# Patient Record
Sex: Female | Born: 1940 | Race: White | Hispanic: No | Smoking: Never smoker
Health system: Southern US, Community
[De-identification: ages and names within clinical notes are randomized; demographics above are authoritative.]

## PROBLEM LIST (undated history)

## (undated) DIAGNOSIS — K589 Irritable bowel syndrome without diarrhea: Secondary | ICD-10-CM

## (undated) DIAGNOSIS — H3530 Unspecified macular degeneration: Secondary | ICD-10-CM

## (undated) DIAGNOSIS — Z8042 Family history of malignant neoplasm of prostate: Secondary | ICD-10-CM

## (undated) DIAGNOSIS — F32A Depression, unspecified: Secondary | ICD-10-CM

## (undated) DIAGNOSIS — F329 Major depressive disorder, single episode, unspecified: Secondary | ICD-10-CM

## (undated) DIAGNOSIS — A6000 Herpesviral infection of urogenital system, unspecified: Secondary | ICD-10-CM

## (undated) DIAGNOSIS — Z803 Family history of malignant neoplasm of breast: Secondary | ICD-10-CM

## (undated) DIAGNOSIS — F3289 Other specified depressive episodes: Secondary | ICD-10-CM

## (undated) DIAGNOSIS — K644 Residual hemorrhoidal skin tags: Secondary | ICD-10-CM

## (undated) DIAGNOSIS — M949 Disorder of cartilage, unspecified: Secondary | ICD-10-CM

## (undated) DIAGNOSIS — M4800 Spinal stenosis, site unspecified: Secondary | ICD-10-CM

## (undated) DIAGNOSIS — D649 Anemia, unspecified: Secondary | ICD-10-CM

## (undated) DIAGNOSIS — F319 Bipolar disorder, unspecified: Secondary | ICD-10-CM

## (undated) DIAGNOSIS — M899 Disorder of bone, unspecified: Secondary | ICD-10-CM

## (undated) DIAGNOSIS — M545 Low back pain, unspecified: Secondary | ICD-10-CM

## (undated) DIAGNOSIS — J40 Bronchitis, not specified as acute or chronic: Secondary | ICD-10-CM

## (undated) DIAGNOSIS — C50919 Malignant neoplasm of unspecified site of unspecified female breast: Secondary | ICD-10-CM

## (undated) DIAGNOSIS — Z923 Personal history of irradiation: Secondary | ICD-10-CM

## (undated) DIAGNOSIS — Z800 Family history of malignant neoplasm of digestive organs: Secondary | ICD-10-CM

## (undated) HISTORY — DX: Bipolar disorder, unspecified: F31.9

## (undated) HISTORY — DX: Spinal stenosis, site unspecified: M48.00

## (undated) HISTORY — DX: Family history of malignant neoplasm of prostate: Z80.42

## (undated) HISTORY — DX: Irritable bowel syndrome without diarrhea: K58.9

## (undated) HISTORY — DX: Depression, unspecified: F32.A

## (undated) HISTORY — DX: Disorder of cartilage, unspecified: M94.9

## (undated) HISTORY — PX: DILATION AND CURETTAGE OF UTERUS: SHX78

## (undated) HISTORY — DX: Herpesviral infection of urogenital system, unspecified: A60.00

## (undated) HISTORY — DX: Unspecified macular degeneration: H35.30

## (undated) HISTORY — DX: Bronchitis, not specified as acute or chronic: J40

## (undated) HISTORY — DX: Other specified depressive episodes: F32.89

## (undated) HISTORY — PX: RECTOCELE REPAIR: SHX761

## (undated) HISTORY — DX: Residual hemorrhoidal skin tags: K64.4

## (undated) HISTORY — PX: TONSILLECTOMY: SUR1361

## (undated) HISTORY — DX: Anemia, unspecified: D64.9

## (undated) HISTORY — PX: ABDOMINAL HYSTERECTOMY: SHX81

## (undated) HISTORY — DX: Malignant neoplasm of unspecified site of unspecified female breast: C50.919

## (undated) HISTORY — DX: Major depressive disorder, single episode, unspecified: F32.9

## (undated) HISTORY — DX: Family history of malignant neoplasm of breast: Z80.3

## (undated) HISTORY — DX: Family history of malignant neoplasm of digestive organs: Z80.0

## (undated) HISTORY — PX: OTHER SURGICAL HISTORY: SHX169

## (undated) HISTORY — DX: Low back pain, unspecified: M54.50

## (undated) HISTORY — DX: Disorder of bone, unspecified: M89.9

## (undated) HISTORY — DX: Low back pain: M54.5

---

## 1998-02-13 ENCOUNTER — Ambulatory Visit (HOSPITAL_COMMUNITY): Admission: RE | Admit: 1998-02-13 | Discharge: 1998-02-13 | Payer: Self-pay | Admitting: Obstetrics & Gynecology

## 1999-02-05 ENCOUNTER — Other Ambulatory Visit: Admission: RE | Admit: 1999-02-05 | Discharge: 1999-02-05 | Payer: Self-pay | Admitting: *Deleted

## 2000-02-19 ENCOUNTER — Encounter: Admission: RE | Admit: 2000-02-19 | Discharge: 2000-02-19 | Payer: Self-pay | Admitting: Neurosurgery

## 2000-02-19 ENCOUNTER — Encounter: Payer: Self-pay | Admitting: Neurosurgery

## 2000-03-17 ENCOUNTER — Other Ambulatory Visit: Admission: RE | Admit: 2000-03-17 | Discharge: 2000-03-17 | Payer: Self-pay | Admitting: *Deleted

## 2000-04-02 ENCOUNTER — Encounter: Payer: Self-pay | Admitting: *Deleted

## 2000-04-02 ENCOUNTER — Encounter: Admission: RE | Admit: 2000-04-02 | Discharge: 2000-04-02 | Payer: Self-pay | Admitting: *Deleted

## 2001-03-23 ENCOUNTER — Other Ambulatory Visit: Admission: RE | Admit: 2001-03-23 | Discharge: 2001-03-23 | Payer: Self-pay | Admitting: *Deleted

## 2002-03-08 ENCOUNTER — Ambulatory Visit (HOSPITAL_COMMUNITY): Admission: RE | Admit: 2002-03-08 | Discharge: 2002-03-08 | Payer: Self-pay | Admitting: Hematology & Oncology

## 2002-03-08 ENCOUNTER — Encounter: Payer: Self-pay | Admitting: Hematology & Oncology

## 2002-03-29 ENCOUNTER — Other Ambulatory Visit: Admission: RE | Admit: 2002-03-29 | Discharge: 2002-03-29 | Payer: Self-pay | Admitting: *Deleted

## 2002-04-18 ENCOUNTER — Encounter: Admission: RE | Admit: 2002-04-18 | Discharge: 2002-04-18 | Payer: Self-pay | Admitting: *Deleted

## 2002-04-18 ENCOUNTER — Encounter: Payer: Self-pay | Admitting: *Deleted

## 2003-03-30 ENCOUNTER — Other Ambulatory Visit: Admission: RE | Admit: 2003-03-30 | Discharge: 2003-03-30 | Payer: Self-pay | Admitting: *Deleted

## 2004-04-16 ENCOUNTER — Other Ambulatory Visit: Admission: RE | Admit: 2004-04-16 | Discharge: 2004-04-16 | Payer: Self-pay | Admitting: Internal Medicine

## 2004-04-25 ENCOUNTER — Encounter: Admission: RE | Admit: 2004-04-25 | Discharge: 2004-04-25 | Payer: Self-pay | Admitting: Internal Medicine

## 2005-10-31 ENCOUNTER — Other Ambulatory Visit: Admission: RE | Admit: 2005-10-31 | Discharge: 2005-10-31 | Payer: Self-pay | Admitting: Internal Medicine

## 2005-11-04 ENCOUNTER — Encounter: Admission: RE | Admit: 2005-11-04 | Discharge: 2005-11-04 | Payer: Self-pay | Admitting: Internal Medicine

## 2006-11-03 ENCOUNTER — Encounter: Payer: Self-pay | Admitting: Family Medicine

## 2007-03-15 ENCOUNTER — Other Ambulatory Visit: Admission: RE | Admit: 2007-03-15 | Discharge: 2007-03-15 | Payer: Self-pay | Admitting: Internal Medicine

## 2007-03-30 LAB — CONVERTED CEMR LAB: Pap Smear: NORMAL

## 2007-04-26 ENCOUNTER — Encounter: Admission: RE | Admit: 2007-04-26 | Discharge: 2007-04-26 | Payer: Self-pay | Admitting: Internal Medicine

## 2007-05-28 ENCOUNTER — Ambulatory Visit: Payer: Self-pay | Admitting: Internal Medicine

## 2007-05-30 HISTORY — PX: OTHER SURGICAL HISTORY: SHX169

## 2007-06-17 ENCOUNTER — Encounter: Admission: RE | Admit: 2007-06-17 | Discharge: 2007-06-17 | Payer: Self-pay | Admitting: Internal Medicine

## 2007-07-01 ENCOUNTER — Encounter: Admission: RE | Admit: 2007-07-01 | Discharge: 2007-07-01 | Payer: Self-pay | Admitting: Internal Medicine

## 2007-07-06 ENCOUNTER — Encounter: Admission: RE | Admit: 2007-07-06 | Discharge: 2007-07-06 | Payer: Self-pay | Admitting: Internal Medicine

## 2007-07-23 ENCOUNTER — Encounter: Admission: RE | Admit: 2007-07-23 | Discharge: 2007-08-26 | Payer: Self-pay | Admitting: Internal Medicine

## 2007-07-30 ENCOUNTER — Ambulatory Visit: Payer: Self-pay | Admitting: Internal Medicine

## 2007-08-13 ENCOUNTER — Ambulatory Visit: Payer: Self-pay | Admitting: Internal Medicine

## 2007-08-13 LAB — HM COLONOSCOPY: HM Colonoscopy: NORMAL

## 2007-10-08 ENCOUNTER — Ambulatory Visit: Payer: Self-pay | Admitting: Family Medicine

## 2007-10-08 DIAGNOSIS — F339 Major depressive disorder, recurrent, unspecified: Secondary | ICD-10-CM | POA: Insufficient documentation

## 2007-10-08 DIAGNOSIS — M4800 Spinal stenosis, site unspecified: Secondary | ICD-10-CM

## 2007-10-11 ENCOUNTER — Encounter: Payer: Self-pay | Admitting: Family Medicine

## 2007-10-12 ENCOUNTER — Encounter: Payer: Self-pay | Admitting: Family Medicine

## 2007-11-16 ENCOUNTER — Ambulatory Visit: Payer: Self-pay | Admitting: Family Medicine

## 2007-11-16 DIAGNOSIS — A6000 Herpesviral infection of urogenital system, unspecified: Secondary | ICD-10-CM | POA: Insufficient documentation

## 2007-12-09 ENCOUNTER — Telehealth: Payer: Self-pay | Admitting: Family Medicine

## 2007-12-20 ENCOUNTER — Encounter: Admission: RE | Admit: 2007-12-20 | Discharge: 2007-12-20 | Payer: Self-pay | Admitting: Family Medicine

## 2007-12-28 ENCOUNTER — Telehealth: Payer: Self-pay | Admitting: Family Medicine

## 2007-12-30 DIAGNOSIS — Z923 Personal history of irradiation: Secondary | ICD-10-CM

## 2007-12-30 HISTORY — DX: Personal history of irradiation: Z92.3

## 2008-01-03 ENCOUNTER — Telehealth: Payer: Self-pay | Admitting: Family Medicine

## 2008-01-05 ENCOUNTER — Encounter: Payer: Self-pay | Admitting: Family Medicine

## 2008-03-03 ENCOUNTER — Ambulatory Visit (HOSPITAL_COMMUNITY): Admission: RE | Admit: 2008-03-03 | Discharge: 2008-03-03 | Payer: Self-pay | Admitting: Neurosurgery

## 2008-03-10 ENCOUNTER — Telehealth: Payer: Self-pay | Admitting: Family Medicine

## 2008-04-12 ENCOUNTER — Ambulatory Visit: Payer: Self-pay | Admitting: Family Medicine

## 2008-04-12 DIAGNOSIS — M545 Low back pain, unspecified: Secondary | ICD-10-CM | POA: Insufficient documentation

## 2008-04-18 ENCOUNTER — Ambulatory Visit: Payer: Self-pay | Admitting: Professional

## 2008-04-25 ENCOUNTER — Ambulatory Visit: Payer: Self-pay | Admitting: Professional

## 2008-04-27 ENCOUNTER — Ambulatory Visit: Payer: Self-pay | Admitting: Family Medicine

## 2008-05-04 ENCOUNTER — Telehealth: Payer: Self-pay | Admitting: Family Medicine

## 2008-05-10 ENCOUNTER — Encounter
Admission: RE | Admit: 2008-05-10 | Discharge: 2008-08-08 | Payer: Self-pay | Admitting: Physical Medicine and Rehabilitation

## 2008-05-15 ENCOUNTER — Ambulatory Visit: Payer: Self-pay | Admitting: Physical Medicine and Rehabilitation

## 2008-05-26 ENCOUNTER — Encounter
Admission: RE | Admit: 2008-05-26 | Discharge: 2008-06-13 | Payer: Self-pay | Admitting: Physical Medicine and Rehabilitation

## 2008-05-29 HISTORY — PX: BREAST LUMPECTOMY: SHX2

## 2008-05-31 ENCOUNTER — Ambulatory Visit: Payer: Self-pay | Admitting: Family Medicine

## 2008-06-12 ENCOUNTER — Telehealth: Payer: Self-pay | Admitting: Family Medicine

## 2008-06-19 ENCOUNTER — Encounter: Admission: RE | Admit: 2008-06-19 | Discharge: 2008-06-19 | Payer: Self-pay | Admitting: Family Medicine

## 2008-06-22 ENCOUNTER — Encounter: Admission: RE | Admit: 2008-06-22 | Discharge: 2008-06-22 | Payer: Self-pay | Admitting: Family Medicine

## 2008-07-03 ENCOUNTER — Ambulatory Visit: Payer: Self-pay | Admitting: Physical Medicine & Rehabilitation

## 2008-07-05 ENCOUNTER — Other Ambulatory Visit (INDEPENDENT_AMBULATORY_CARE_PROVIDER_SITE_OTHER): Payer: Self-pay | Admitting: Surgery

## 2008-07-05 ENCOUNTER — Encounter: Admission: RE | Admit: 2008-07-05 | Discharge: 2008-07-05 | Payer: Self-pay | Admitting: Surgery

## 2008-07-18 ENCOUNTER — Encounter: Admission: RE | Admit: 2008-07-18 | Discharge: 2008-07-18 | Payer: Self-pay | Admitting: Surgery

## 2008-07-18 ENCOUNTER — Encounter: Payer: Self-pay | Admitting: Family Medicine

## 2008-07-18 ENCOUNTER — Encounter (INDEPENDENT_AMBULATORY_CARE_PROVIDER_SITE_OTHER): Payer: Self-pay | Admitting: Surgery

## 2008-07-18 ENCOUNTER — Ambulatory Visit (HOSPITAL_BASED_OUTPATIENT_CLINIC_OR_DEPARTMENT_OTHER): Admission: RE | Admit: 2008-07-18 | Discharge: 2008-07-18 | Payer: Self-pay | Admitting: Surgery

## 2008-07-25 ENCOUNTER — Ambulatory Visit: Payer: Self-pay | Admitting: Oncology

## 2008-07-25 DIAGNOSIS — C50919 Malignant neoplasm of unspecified site of unspecified female breast: Secondary | ICD-10-CM

## 2008-07-31 ENCOUNTER — Encounter: Payer: Self-pay | Admitting: Internal Medicine

## 2008-07-31 ENCOUNTER — Encounter: Payer: Self-pay | Admitting: Family Medicine

## 2008-07-31 LAB — CBC WITH DIFFERENTIAL (CANCER CENTER ONLY)
BASO%: 0.5 % (ref 0.0–2.0)
EOS%: 9.9 % — ABNORMAL HIGH (ref 0.0–7.0)
LYMPH%: 41.2 % (ref 14.0–48.0)
MCH: 28.8 pg (ref 26.0–34.0)
MCHC: 34.9 g/dL (ref 32.0–36.0)
MCV: 82 fL (ref 81–101)
MONO%: 5.7 % (ref 0.0–13.0)
NEUT#: 1.8 10*3/uL (ref 1.5–6.5)
Platelets: 238 10*3/uL (ref 145–400)
RDW: 12.8 % (ref 10.5–14.6)

## 2008-07-31 LAB — COMPREHENSIVE METABOLIC PANEL
ALT: 18 U/L (ref 0–35)
BUN: 10 mg/dL (ref 6–23)
CO2: 28 mEq/L (ref 19–32)
Calcium: 9.4 mg/dL (ref 8.4–10.5)
Creatinine, Ser: 0.63 mg/dL (ref 0.40–1.20)
Total Bilirubin: 0.6 mg/dL (ref 0.3–1.2)

## 2008-08-01 LAB — IRON AND TIBC
%SAT: 23 % (ref 20–55)
Iron: 82 ug/dL (ref 42–145)
UIBC: 268 ug/dL

## 2008-08-01 LAB — FERRITIN: Ferritin: 75 ng/mL (ref 10–291)

## 2008-08-02 ENCOUNTER — Ambulatory Visit: Admission: RE | Admit: 2008-08-02 | Discharge: 2008-10-16 | Payer: Self-pay | Admitting: Radiation Oncology

## 2008-08-02 ENCOUNTER — Encounter: Payer: Self-pay | Admitting: Family Medicine

## 2008-08-08 ENCOUNTER — Encounter
Admission: RE | Admit: 2008-08-08 | Discharge: 2008-08-09 | Payer: Self-pay | Admitting: Physical Medicine and Rehabilitation

## 2008-08-09 ENCOUNTER — Ambulatory Visit: Payer: Self-pay | Admitting: Physical Medicine and Rehabilitation

## 2008-09-11 ENCOUNTER — Telehealth: Payer: Self-pay | Admitting: Family Medicine

## 2008-09-29 ENCOUNTER — Other Ambulatory Visit: Admission: RE | Admit: 2008-09-29 | Discharge: 2008-09-29 | Payer: Self-pay | Admitting: Family Medicine

## 2008-09-29 ENCOUNTER — Encounter: Payer: Self-pay | Admitting: Family Medicine

## 2008-09-29 ENCOUNTER — Ambulatory Visit: Payer: Self-pay | Admitting: Family Medicine

## 2008-10-02 ENCOUNTER — Telehealth: Payer: Self-pay | Admitting: Family Medicine

## 2008-10-02 LAB — CONVERTED CEMR LAB: Pap Smear: NORMAL

## 2008-10-04 ENCOUNTER — Encounter (INDEPENDENT_AMBULATORY_CARE_PROVIDER_SITE_OTHER): Payer: Self-pay | Admitting: *Deleted

## 2008-10-10 ENCOUNTER — Encounter: Payer: Self-pay | Admitting: Family Medicine

## 2008-10-23 ENCOUNTER — Ambulatory Visit: Payer: Self-pay | Admitting: Oncology

## 2008-10-24 ENCOUNTER — Encounter: Payer: Self-pay | Admitting: Family Medicine

## 2008-10-24 LAB — MORPHOLOGY - CHCC SATELLITE: Platelet Morphology: NORMAL

## 2008-10-24 LAB — CBC WITH DIFFERENTIAL (CANCER CENTER ONLY)
BASO%: 0.4 % (ref 0.0–2.0)
LYMPH#: 1.1 10*3/uL (ref 0.9–3.3)
LYMPH%: 35.5 % (ref 14.0–48.0)
MCV: 84 fL (ref 81–101)
MONO#: 0.2 10*3/uL (ref 0.1–0.9)
NEUT#: 1.6 10*3/uL (ref 1.5–6.5)
Platelets: 232 10*3/uL (ref 145–400)
RDW: 12.3 % (ref 10.5–14.6)
WBC: 3.1 10*3/uL — ABNORMAL LOW (ref 3.9–10.0)

## 2008-10-26 LAB — HEMOGLOBINOPATHY EVALUATION
Hgb A2 Quant: 2.5 % (ref 2.2–3.2)
Hgb A: 97.5 % (ref 96.8–97.8)
Hgb F Quant: 0 % (ref 0.0–2.0)

## 2008-10-26 LAB — PROTEIN ELECTROPHORESIS, SERUM
Albumin ELP: 64 % (ref 55.8–66.1)
Alpha-2-Globulin: 9.3 % (ref 7.1–11.8)
Beta Globulin: 6.6 % (ref 4.7–7.2)
Total Protein, Serum Electrophoresis: 6.6 g/dL (ref 6.0–8.3)

## 2008-10-26 LAB — ERYTHROPOIETIN: Erythropoietin: 20.3 m[IU]/mL (ref 2.6–34.0)

## 2008-11-09 ENCOUNTER — Telehealth: Payer: Self-pay | Admitting: Family Medicine

## 2008-11-14 ENCOUNTER — Encounter: Payer: Self-pay | Admitting: Family Medicine

## 2008-11-21 ENCOUNTER — Telehealth: Payer: Self-pay | Admitting: Family Medicine

## 2008-11-28 ENCOUNTER — Telehealth: Payer: Self-pay | Admitting: Family Medicine

## 2009-04-20 ENCOUNTER — Telehealth: Payer: Self-pay | Admitting: Family Medicine

## 2009-05-14 ENCOUNTER — Encounter: Payer: Self-pay | Admitting: Family Medicine

## 2009-05-23 ENCOUNTER — Encounter: Payer: Self-pay | Admitting: Family Medicine

## 2009-06-25 ENCOUNTER — Encounter: Admission: RE | Admit: 2009-06-25 | Discharge: 2009-06-25 | Payer: Self-pay | Admitting: Radiation Oncology

## 2009-08-06 ENCOUNTER — Telehealth: Payer: Self-pay | Admitting: Family Medicine

## 2009-09-17 ENCOUNTER — Telehealth: Payer: Self-pay | Admitting: Family Medicine

## 2009-09-20 ENCOUNTER — Encounter (INDEPENDENT_AMBULATORY_CARE_PROVIDER_SITE_OTHER): Payer: Self-pay | Admitting: *Deleted

## 2009-09-26 ENCOUNTER — Telehealth: Payer: Self-pay | Admitting: Family Medicine

## 2009-10-09 ENCOUNTER — Encounter: Payer: Self-pay | Admitting: Family Medicine

## 2009-10-12 ENCOUNTER — Ambulatory Visit: Payer: Self-pay | Admitting: Family Medicine

## 2009-10-19 ENCOUNTER — Ambulatory Visit: Payer: Self-pay | Admitting: Family Medicine

## 2009-11-01 ENCOUNTER — Ambulatory Visit: Payer: Self-pay | Admitting: Oncology

## 2009-11-05 ENCOUNTER — Encounter: Payer: Self-pay | Admitting: Family Medicine

## 2009-11-05 LAB — CBC WITH DIFFERENTIAL (CANCER CENTER ONLY)
BASO#: 0 10*3/uL (ref 0.0–0.2)
EOS%: 5 % (ref 0.0–7.0)
Eosinophils Absolute: 0.2 10*3/uL (ref 0.0–0.5)
HGB: 10.6 g/dL — ABNORMAL LOW (ref 11.6–15.9)
LYMPH%: 38.2 % (ref 14.0–48.0)
MCH: 29.5 pg (ref 26.0–34.0)
MCHC: 33.7 g/dL (ref 32.0–36.0)
MCV: 88 fL (ref 81–101)
MONO%: 7.2 % (ref 0.0–13.0)
RBC: 3.6 10*6/uL — ABNORMAL LOW (ref 3.70–5.32)

## 2009-11-05 LAB — BASIC METABOLIC PANEL - CANCER CENTER ONLY
BUN, Bld: 15 mg/dL (ref 7–22)
Chloride: 98 mEq/L (ref 98–108)
Glucose, Bld: 119 mg/dL — ABNORMAL HIGH (ref 73–118)
Potassium: 4.4 mEq/L (ref 3.3–4.7)

## 2009-11-14 ENCOUNTER — Telehealth: Payer: Self-pay | Admitting: Family Medicine

## 2009-11-14 DIAGNOSIS — D638 Anemia in other chronic diseases classified elsewhere: Secondary | ICD-10-CM

## 2009-11-20 ENCOUNTER — Ambulatory Visit: Payer: Self-pay | Admitting: Family Medicine

## 2009-12-04 ENCOUNTER — Encounter: Payer: Self-pay | Admitting: Family Medicine

## 2009-12-04 ENCOUNTER — Ambulatory Visit: Payer: Self-pay | Admitting: Internal Medicine

## 2009-12-06 ENCOUNTER — Telehealth: Payer: Self-pay | Admitting: Family Medicine

## 2009-12-11 ENCOUNTER — Telehealth: Payer: Self-pay | Admitting: Family Medicine

## 2009-12-19 ENCOUNTER — Ambulatory Visit: Payer: Self-pay | Admitting: Oncology

## 2009-12-22 DIAGNOSIS — M949 Disorder of cartilage, unspecified: Secondary | ICD-10-CM

## 2009-12-22 DIAGNOSIS — M899 Disorder of bone, unspecified: Secondary | ICD-10-CM | POA: Insufficient documentation

## 2009-12-24 ENCOUNTER — Encounter (INDEPENDENT_AMBULATORY_CARE_PROVIDER_SITE_OTHER): Payer: Self-pay | Admitting: *Deleted

## 2009-12-25 LAB — CBC & DIFF AND RETIC
Basophils Absolute: 0 10*3/uL (ref 0.0–0.1)
EOS%: 6.3 % (ref 0.0–7.0)
HGB: 11 g/dL — ABNORMAL LOW (ref 11.6–15.9)
MCH: 29.2 pg (ref 25.1–34.0)
MCV: 89.9 fL (ref 79.5–101.0)
MONO%: 6.5 % (ref 0.0–14.0)
NEUT%: 39.2 % (ref 38.4–76.8)
RDW: 13.1 % (ref 11.2–14.5)

## 2009-12-25 LAB — COMPREHENSIVE METABOLIC PANEL
ALT: 21 U/L (ref 0–35)
AST: 26 U/L (ref 0–37)
Albumin: 4.2 g/dL (ref 3.5–5.2)
Alkaline Phosphatase: 45 U/L (ref 39–117)
BUN: 13 mg/dL (ref 6–23)
CO2: 31 mEq/L (ref 19–32)
Calcium: 9.6 mg/dL (ref 8.4–10.5)
Chloride: 102 mEq/L (ref 96–112)
Creatinine, Ser: 0.7 mg/dL (ref 0.40–1.20)
Glucose, Bld: 100 mg/dL — ABNORMAL HIGH (ref 70–99)
Potassium: 4.2 mEq/L (ref 3.5–5.3)
Sodium: 139 mEq/L (ref 135–145)
Total Bilirubin: 0.6 mg/dL (ref 0.3–1.2)
Total Protein: 7.1 g/dL (ref 6.0–8.3)

## 2009-12-25 LAB — URINALYSIS, MICROSCOPIC - CHCC
Blood: NEGATIVE
Specific Gravity, Urine: 1.005 (ref 1.003–1.035)
pH: 7.5 (ref 4.6–8.0)

## 2009-12-25 LAB — SEDIMENTATION RATE: Sed Rate: 21 mm/hr (ref 0–22)

## 2009-12-25 LAB — MORPHOLOGY
PLT EST: ADEQUATE
RBC Comments: NORMAL

## 2009-12-25 LAB — CHCC SMEAR

## 2009-12-25 LAB — LACTATE DEHYDROGENASE: LDH: 190 U/L (ref 94–250)

## 2009-12-26 ENCOUNTER — Encounter: Payer: Self-pay | Admitting: Family Medicine

## 2009-12-27 LAB — IMMUNOFIXATION ELECTROPHORESIS
IgA: 258 mg/dL (ref 68–378)
IgG (Immunoglobin G), Serum: 924 mg/dL (ref 694–1618)

## 2010-01-02 ENCOUNTER — Encounter: Payer: Self-pay | Admitting: Family Medicine

## 2010-02-11 ENCOUNTER — Encounter: Payer: Self-pay | Admitting: Family Medicine

## 2010-02-11 ENCOUNTER — Ambulatory Visit: Payer: Self-pay | Admitting: Family Medicine

## 2010-02-13 ENCOUNTER — Telehealth: Payer: Self-pay | Admitting: Family Medicine

## 2010-02-18 ENCOUNTER — Encounter: Payer: Self-pay | Admitting: Family Medicine

## 2010-03-07 ENCOUNTER — Encounter: Payer: Self-pay | Admitting: Family Medicine

## 2010-03-18 ENCOUNTER — Encounter: Payer: Self-pay | Admitting: Family Medicine

## 2010-06-28 ENCOUNTER — Encounter: Admission: RE | Admit: 2010-06-28 | Discharge: 2010-06-28 | Payer: Self-pay | Admitting: Surgery

## 2010-10-03 ENCOUNTER — Ambulatory Visit: Payer: Self-pay | Admitting: Family Medicine

## 2010-10-16 ENCOUNTER — Telehealth: Payer: Self-pay | Admitting: Family Medicine

## 2010-10-25 ENCOUNTER — Ambulatory Visit: Payer: Self-pay | Admitting: Oncology

## 2010-11-07 ENCOUNTER — Encounter (INDEPENDENT_AMBULATORY_CARE_PROVIDER_SITE_OTHER): Payer: Self-pay | Admitting: *Deleted

## 2010-11-07 LAB — CONVERTED CEMR LAB
BUN: 12 mg/dL
CO2: 26 meq/L
Chloride: 103 meq/L
Cholesterol: 213 mg/dL
Creatinine, Ser: 0.7 mg/dL
HCT: 33.6 %
HDL: 79 mg/dL
Hemoglobin: 10.6 g/dL
LDL Cholesterol: 113 mg/dL
MCHC: 31.5 g/dL
MCV: 90.3 fL
Platelets: 264 10*3/uL
Potassium: 4.1 meq/L
RDW: 13.2 %
Sodium: 140 meq/L
Total CHOL/HDL Ratio: 2.7
Triglycerides: 107 mg/dL
VLDL: 21 mg/dL
WBC: 4.4 10*3/uL

## 2010-11-08 ENCOUNTER — Telehealth: Payer: Self-pay | Admitting: Family Medicine

## 2010-11-08 ENCOUNTER — Other Ambulatory Visit: Admission: RE | Admit: 2010-11-08 | Discharge: 2010-11-08 | Payer: Self-pay | Admitting: Family Medicine

## 2010-11-08 ENCOUNTER — Ambulatory Visit: Payer: Self-pay | Admitting: Family Medicine

## 2010-11-14 LAB — CONVERTED CEMR LAB: Pap Smear: NEGATIVE

## 2010-11-15 ENCOUNTER — Encounter (INDEPENDENT_AMBULATORY_CARE_PROVIDER_SITE_OTHER): Payer: Self-pay | Admitting: *Deleted

## 2011-01-10 ENCOUNTER — Ambulatory Visit: Payer: Self-pay | Admitting: Oncology

## 2011-01-14 ENCOUNTER — Encounter: Payer: Self-pay | Admitting: Family Medicine

## 2011-01-14 ENCOUNTER — Encounter (INDEPENDENT_AMBULATORY_CARE_PROVIDER_SITE_OTHER): Payer: Self-pay | Admitting: *Deleted

## 2011-01-14 LAB — CBC & DIFF AND RETIC
BASO%: 0.6 % (ref 0.0–2.0)
Basophils Absolute: 0 10*3/uL (ref 0.0–0.1)
EOS%: 4.2 % (ref 0.0–7.0)
Eosinophils Absolute: 0.2 10*3/uL (ref 0.0–0.5)
HCT: 32.6 % — ABNORMAL LOW (ref 34.8–46.6)
HGB: 10.6 g/dL — ABNORMAL LOW (ref 11.6–15.9)
Immature Retic Fract: 2.1 % (ref 0.00–10.70)
LYMPH%: 36.4 % (ref 14.0–49.7)
MCH: 28.8 pg (ref 25.1–34.0)
MCHC: 32.5 g/dL (ref 31.5–36.0)
MCV: 88.6 fL (ref 79.5–101.0)
MONO#: 0.4 10*3/uL (ref 0.1–0.9)
MONO%: 8.6 % (ref 0.0–14.0)
NEUT#: 2.4 10*3/uL (ref 1.5–6.5)
NEUT%: 50.2 % (ref 38.4–76.8)
Platelets: 231 10*3/uL (ref 145–400)
RBC: 3.68 10*6/uL — ABNORMAL LOW (ref 3.70–5.45)
RDW: 13.1 % (ref 11.2–14.5)
Retic %: 0.89 % (ref 0.50–1.50)
Retic Ct Abs: 32.75 10*3/uL (ref 18.30–72.70)
WBC: 4.8 10*3/uL (ref 3.9–10.3)
lymph#: 1.7 10*3/uL (ref 0.9–3.3)
nRBC: 0 % (ref 0–0)

## 2011-01-14 LAB — IRON AND TIBC
%SAT: 19 % — ABNORMAL LOW (ref 20–55)
Iron: 71 ug/dL (ref 42–145)
TIBC: 380 ug/dL (ref 250–470)
UIBC: 309 ug/dL

## 2011-01-14 LAB — CONVERTED CEMR LAB
Ferritin: 109 ng/mL
Hemoglobin: 10.6 g/dL
Iron: 71 ug/dL
Platelets: 231 10*3/uL
RBC: 3.68 M/uL
RDW: 13.1 %
Saturation Ratios: 19 %
UIBC: 309 ug/dL
WBC: 4.8 10*3/uL

## 2011-01-14 LAB — MORPHOLOGY: PLT EST: ADEQUATE

## 2011-01-14 LAB — FERRITIN: Ferritin: 107 ng/mL (ref 10–291)

## 2011-01-14 LAB — FOLATE: Folate: >20 ng/mL

## 2011-01-14 LAB — VITAMIN B12: Vitamin B-12: 602 pg/mL (ref 211–911)

## 2011-01-19 ENCOUNTER — Encounter: Payer: Self-pay | Admitting: Internal Medicine

## 2011-01-19 ENCOUNTER — Encounter: Payer: Self-pay | Admitting: Neurosurgery

## 2011-01-19 ENCOUNTER — Encounter: Payer: Self-pay | Admitting: Family Medicine

## 2011-01-21 ENCOUNTER — Encounter (INDEPENDENT_AMBULATORY_CARE_PROVIDER_SITE_OTHER): Payer: Self-pay | Admitting: *Deleted

## 2011-01-28 NOTE — Consult Note (Signed)
Summary: [facility]  [facility]   Imported By: Lanelle Bal 02/22/2010 09:48:11  _____________________________________________________________________  External Attachment:    Type:   Image     Comment:   External Document

## 2011-01-28 NOTE — Letter (Signed)
Summary: [facility]  [facility]   Imported By: Maryln Gottron 03/18/2010 11:00:12  _____________________________________________________________________  External Attachment:    Type:   Image     Comment:   External Document

## 2011-01-28 NOTE — Letter (Signed)
Summary: [facility]   Imported By: Maryln Gottron 03/19/2010 13:35:30  _____________________________________________________________________  External Attachment:    Type:   Image     Comment:   External Document

## 2011-01-28 NOTE — Assessment & Plan Note (Signed)
Summary: CPX/CLE   Vital Signs:  Patient profile:   70 year old female Height:      61.5 inches Weight:      150.0 pounds BMI:     27.98 Temp:     98.2 degrees F oral Pulse rate:   72 / minute Pulse rhythm:   regular BP sitting:   100 / 72  (left arm) Cuff size:   regular  Vitals Entered By: Benny Lennert CMA Duncan Dull) (November 08, 2010 8:25 AM)  History of Present Illness: Chief complaint  Chronic Health Management.  Depression, well controlled on Nardil.Marland Kitchenseeing Dr. Jerrel Ivory. Retiring. No SI.  Spinal , stable on neurontin.Better the more she walks. Plans on increasing to 3 miles 3 times a week.   [facility]..reviewed  Anemia, Chronic:  Seen Dr. 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 Once Daily 2)  Loperamide 1 Mg. Marland Kitchen... 1 By Mouth  A Day 3)  Caltrate 600+d 600-400 Mg-Unit  Tabs (Calcium Carbonate-Vitamin D) .Marland Kitchen.. 1 By Mouth Two Times A Day 4)  Nardil 15 Mg  Tabs (Phenelzine Sulfate) .... Two By Mouth Every Morning and 2 By Mouth Each Evening 5)  Multivitamins   Tabs (Multiple Vitamin) .... Once Daily 6)  Vitamin D 400 Unit  Tabs (Cholecalciferol) .... Once Daily 7)  Bl Garlic 400 Mg  Tabs (Garlic-Calcium) .... Once  Daily 8)  Gabapentin 300 Mg  Caps (Gabapentin) .... 2 Tablets Po  Two Times A Day 9)  Lovaza 1 Gm  Caps (Omega-3-Acid Ethyl Esters) .... Take 1capsules  By Mouth Two Times A Day 10)  Fosamax 70 Mg  Tabs (Alendronate Sodium) .... Take One Tablet Each Week  Allergies: 1)  ! Penicillin 2)  ! * Ivp Dye 3)  ! Reglan (Metoclopramide Hcl)  Past History:  Past medical, surgical, family and social histories (including risk factors) reviewed, and no changes noted (except as noted below).  Past Medical History: Reviewed history from 10/03/2010 and no changes required. LOW BACK PAIN, CHRONIC (ICD-724.2) GENITAL HERPES (ICD-054.10) DEPRESSION (ICD-311) SPINAL STENOSIS (ICD-724.00)   Hemorrhoids Irritable Bowel Syndrome  Past Surgical History: Reviewed history from 07/25/2008 and no changes required. 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 father died age 46 prosatate cancer, pancreatic cancer, alzheimer's, MI mother died age 41 ulcerative colitis, breast cancer 1 brother HTN, high chol no MI <age 11  Social History: Reviewed history from 10/08/2007 and no changes required. 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 CV:  Denies chest pain or discomfort. Resp:  Denies shortness of breath. GI:  Denies abdominal pain, bloody stools, constipation, and diarrhea. GU:  Denies dysuria. Derm:  Complains of lesion(s); Noted area flaky on left side of nose.Marland KitchenMarland Kitchenpresent several months. Psych:  Denies anxiety and depression.  Physical Exam  General:  Well-developed,well-nourished,in no acute distress;  alert,appropriate and cooperative throughout examination Ears:  External ear exam shows no significant lesions or deformities.  Otoscopic examination reveals clear canals, tympanic membranes are intact bilaterally without bulging, retraction, inflammation or discharge. Hearing is grossly normal bilaterally. Nose:  External nasal examination shows no deformity or inflammation. Nasal mucosa are pink and moist without lesions or exudates. Mouth:  Oral mucosa and oropharynx without lesions or exudates.  Teeth in good repair. Neck:  no carotid bruit or thyromegaly no cervical or supraclavicular lymphadenopathy  Lungs:  Normal respiratory effort, chest expands symmetrically. Lungs are clear to auscultation, no crackles or wheezes. Heart:  Normal rate and regular rhythm. S1 and S2 normal without gallop, murmur, click, rub or other extra sounds. Abdomen:  Bowel sounds positive,abdomen soft and non-tender without masses, organomegaly or hernias noted. Genitalia:  Cervix centrally..no lesions.Marland KitchenPAP performed, no uterus no ovatries so no DVE performed.  Msk:  No deformity or scoliosis noted of thoracic or lumbar spine.   Pulses:  R and L posterior tibial pulses are full and equal bilaterally  Extremities:  no  edema Skin:  Intact without suspicious lesions or rashes...area of flky ness left nostril...apply cortisone if not resolving in 2 weeks return for cryotherapy Psych:  Cognition and judgment appear intact. Alert and cooperative with normal attention span and concentration. No apparent delusions, illusions, hallucinations   Impression & Recommendations:  Problem # 1:  ANEMIA, NORMOCYTIC (ICD-285.9) Stable .Marland Kitchennormal level per pt. No disorder per Dr. Cyndie Chime  Problem # 2:  LOW BACK PAIN, CHRONIC (ICD-724.2) Stable. Her updated medication list for this problem includes:    Adult Aspirin Ec Low Strength 81 Mg Tbec (Aspirin) ..... Once daily  Problem # 3:  DEPRESSION (ICD-311) Stable well controlled  on Nardil. Her updated medication list for this problem includes:    Nardil 15 Mg Tabs (Phenelzine sulfate) .Marland Kitchen..Marland Kitchen Two by mouth every morning and 2 by mouth each evening  Problem # 4:  OSTEOPENIA (ICD-733.90) Vit D level nml. On Ca and vit D. Weight bearing exercsie. Her updated medication list for this problem includes:    Caltrate 600+d 600-400 Mg-unit Tabs (Calcium carbonate-vitamin d) .Marland Kitchen... 1 by mouth two times a day    Vitamin D 400 Unit Tabs (Cholecalciferol) ..... Once daily    Fosamax 70 Mg Tabs (Alendronate sodium) .Marland Kitchen... Take one tablet each week  Complete Medication List: 1)  Adult Aspirin Ec Low Strength 81 Mg Tbec (Aspirin) .... Once daily 2)  Loperamide 1 Mg.  Marland Kitchen... 1 by mouth  a day 3)  Caltrate 600+d 600-400 Mg-unit Tabs (Calcium carbonate-vitamin d) .Marland Kitchen.. 1 by mouth two times a day 4)  Nardil 15 Mg Tabs (Phenelzine sulfate) .... Two by mouth every morning and 2 by mouth each evening 5)  Multivitamins Tabs (Multiple vitamin) .... Once daily 6)  Vitamin D 400 Unit Tabs (Cholecalciferol) .... Once daily 7)  Bl Garlic 400 Mg Tabs (Garlic-calcium) .... Once daily 8)  Gabapentin 300 Mg Caps (Gabapentin) .... 2 tablets po  two times a day 9)  Fosamax 70 Mg Tabs (Alendronate sodium) .... Take one tablet each week  Other  Orders: Radiology Referral (Radiology)  Patient Instructions: 1)  Referral Appointment Information 2)  Day/Date: 3)  Time: 4)  Place/MD: 5)  Address: 6)  Phone/Fax: 7)  Patient given appointment information. Information/Orders faxed/mailed.  8)  Continue healthy lifestyle and work on increasing exercisie and weight loss.  9)  Please schedule a follow-up appointment in 1 year.    Orders Added: 1)  Radiology Referral [Radiology] 2)  Est. Patient Level IV [16109]    Current Allergies (reviewed today): ! PENICILLIN ! * IVP DYE ! REGLAN (METOCLOPRAMIDE HCL)  Last Flu Vaccine:  to be given at work (10/12/2009 2:42:50 PM) Flu Vaccine Result Date:   11/08/2010 Flu Vaccine Result:  given at work Flu Vaccine Next Due:  1 yr    Past Medical History:    Reviewed history from 10/03/2010 and no changes required:       LOW BACK PAIN, CHRONIC (ICD-724.2)       GENITAL HERPES (ICD-054.10)       DEPRESSION (ICD-311)       SPINAL STENOSIS (ICD-724.00)               Hemorrhoids       Irritable Bowel Syndrome  Past Surgical History:    Reviewed history from 07/25/2008 and no changes required:       6/08 MRI L4-L5 spinal stenosis       Hysterectomy, total but cervix left       rectocele repair       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

## 2011-01-28 NOTE — Assessment & Plan Note (Signed)
Summary: right ankle pain no better   Vital Signs:  Patient profile:   70 year old female Height:      61.5 inches Weight:      154.2 pounds BMI:     28.77 Temp:     98.1 degrees F oral Pulse rate:   80 / minute Pulse rhythm:   regular BP sitting:   110 / 60  (left arm) Cuff size:   regular  Vitals Entered By: Benny Lennert CMA Duncan Dull) (February 11, 2010 12:42 PM)  History of Present Illness: Chief complaint right ankle pain no better  70 year old with continued R ankle pain  She has been ambulatory throughout this time period. Right ankle / foot pain for the last several months with some mild swelling. She never had any dramatic occult injury. Upon reflection now, she recalls that she did bump her lateral ankle earlier in the fall around November - she may have somewhat inverted it at that time, but no dramatic injury.  Still with some swelling around the sinus tarsi as well.   REVIEW OF SYSTEMS  GEN: No systemic complaints, no fevers, chills, sweats, or other acute illnesses MSK: Detailed in the HPI GI: tolerating PO intake without difficulty Neuro: No numbness, parasthesias, or tingling associated. Otherwise the pertinent positives of the ROS are noted above.    Allergies: 1)  ! Penicillin 2)  ! * Ivp Dye 3)  ! Reglan (Metoclopramide Hcl)  Past History:  Past medical, surgical, family and social histories (including risk factors) reviewed, and no changes noted (except as noted below).  Past Medical History: Reviewed history from 05/29/2008 and no changes required. Current Problems:  LOW BACK PAIN, CHRONIC (ICD-724.2) GENITAL HERPES (ICD-054.10) DEPRESSION (ICD-311) SPINAL STENOSIS (ICD-724.00)   Hemorrhoids Irritable Bowel Syndrome  Past Surgical History: Reviewed history from 07/25/2008 and no changes required. 6/08 MRI L4-L5 spinal stenosis Hysterectomy, total but cervix left rectocele repair 7/09 left breas lumpectomy, high grade carcinoma in  situ  Family History: Reviewed history from 10/08/2007 and no changes required. father died age 38 prosatate cancer, pancreatic cancer, alzheimer's, MI mother died age 53 ulcerative colitis, breast cancer 1 brother HTN, high chol no MI <age 94  Social History: Reviewed history from 10/08/2007 and no changes required. 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 No apparent alopecia or balding. Ears, externally no deformities PULM: Breathing comfortably in no respiratory distress EXT: No clubbing, cyanosis. PSYCH: Normally interactive. Cooperative during the interview. Pleasant. Friendly and conversant. Not anxious or depressed appearing. Normal, full affect.  Msk:  L foot and ankle, full ROM, NT, str intact  R ankle. Mildly tender at the lateral malleolus, NT ATFL, CFL, medial ligs. stable drawer.  neg subtalar tilt. NT calcaneous, NT achilles, NT throughout the forefoot and midfoot.  NT all phalanges.   Impression & Recommendations:  Problem # 1:  CLOSED FRACTURE OF LATERAL MALLEOLUS (ICD-824.2) Assessment New DOI approx 10/29/2009, but exact DOI unknown  X-ray, Ankle: AP, Lateral, and Mortise Views Indication: Ankle pain Findings: There is evidence of a healing stable fracture at the distal most end of fibula. Good healing and in satisfactory position with no significant displacement.  Suspect that while ambulatory, has not had adequate immobilzation and  initially had a relatively small fracture at the distal fibula in the fall. Placed in a short aircast cam walker boot.  Immobilze 3-4 weeks then recheck.  Problem #  2:  ANKLE PAIN, RIGHT (ICD-719.47)  Orders: Radiology other (Radiology Other)  Complete Medication List: 1)  Adult Aspirin Ec Low Strength 81 Mg Tbec (Aspirin) .... Once daily 2)  Loperamide 1 Mg.  Marland Kitchen... 1 by mouth  a day 3)  Caltrate 600+d 600-400 Mg-unit Tabs (Calcium carbonate-vitamin d) .Marland Kitchen.. 1 by mouth two times a day 4)  Nardil 15 Mg Tabs (Phenelzine sulfate) .... Two by mouth every morning and 2 by mouth each evening 5)  Multivitamins Tabs (Multiple vitamin) .... Once daily 6)  Vitamin D 400 Unit Tabs (Cholecalciferol) .... Once daily 7)  Bl Garlic 400 Mg Tabs (Garlic-calcium) .... Once daily 8)  Gabapentin 300 Mg Caps (Gabapentin) .... 2 tablets po  two times a day 9)  Lovaza 1 Gm Caps (Omega-3-acid ethyl esters) .... Take 1capsules  by mouth two times a day 10)  Fosamax 70 Mg Tabs (Alendronate sodium) .... Take one tablet each week  Patient Instructions: 1)  Excellent Over the Counter Orthotics: 2)  Hapad: available at www.hapad.com 3)  SPENCO: Available at some sports stores or www.amazon.com 4)  www.pedifix.com and [employer]" website: multiple good foot products 5)  SHOES: Danskos, Merrells, Keens, Clarks - good arch support, want minimal bendability 6)  [employer] in [place]: excellent staff, shoe selection, OTC orthotics 7)  Shoes: Birkenstock shoes, [employer] 8)  ***[employer], [address] [place], [place]***  9)  COME BACK FOR AN XRAY OF YOUR RIGHT ANKLE  Current Allergies (reviewed today): ! PENICILLIN ! * IVP DYE ! REGLAN (METOCLOPRAMIDE HCL)

## 2011-01-28 NOTE — Letter (Signed)
[facility]   Imported By: Lanelle Bal 01/23/2010 15:50:15  _____________________________________________________________________  External Attachment:    Type:   Image     Comment:   External Document

## 2011-01-28 NOTE — Miscellaneous (Signed)
   Clinical Lists Changes  Observations: Added new observation of CREATININE: 0.7 mg/dL (21/30/8657 8:46) Added new observation of BUN: 12.0 mg/dL (96/29/5284 1:32) Added new observation of CO2 PLSM/SER: 26.0 meq/L (11/07/2010 8:23) Added new observation of CL SERUM: 103 meq/L (11/07/2010 8:23) Added new observation of K SERUM: 4.1 meq/L (11/07/2010 8:23) Added new observation of NA: 140.0 meq/L (11/07/2010 8:23) Added new observation of PLATELETK/UL: 264.0 K/uL (11/07/2010 8:23) Added new observation of RDW: 13.2 % (11/07/2010 8:23) Added new observation of MCHC RBC: 31.5 g/dL (44/12/270 5:36) Added new observation of MCV: 90.3 fL (11/07/2010 8:23) Added new observation of HCT: 33.6 % (11/07/2010 8:23) Added new observation of HGB: 10.6 g/dL (64/40/3474 2:59) Added new observation of WBC COUNT: 4.4 10*3/microliter (11/07/2010 8:23) Added new observation of CHOL/HDL: 2.7  (11/07/2010 8:23) Added new observation of VLDL: 21.0 mg/dL (56/38/7564 3:32) Added new observation of LDL: 113 mg/dL (95/18/8416 6:06) Added new observation of HDL: 79.0 mg/dL (30/16/0109 3:23) Added new observation of TRIGLYC TOT: 107 mg/dL (55/73/2202 5:42) Added new observation of CHOLESTEROL: 213 mg/dL (70/62/3762 8:31)

## 2011-01-28 NOTE — Letter (Signed)
Summary: [facility]  [facility]   Imported By: Lanelle Bal 01/30/2010 14:24:56  _____________________________________________________________________  External Attachment:    Type:   Image     Comment:   External Document

## 2011-01-28 NOTE — Progress Notes (Signed)
Summary: wants referral to ortho  Phone Note Call from Patient Call back at Work Phone [phone]   Caller: Patient Summary of Call: Pt was seen on 2/14 for a right ankle fracture.  She is asking to be referred to an ortho for this.  She wants to see someone associated with [facility]. Initial call taken by: Lowella Petties CMA,  February 13, 2010 11:24 AM  Follow-up for Phone Call        Please call the patient before referral made.  She has a straightforward ankle injury. She is immobilized and this is not a surgical case.  I have a large referral practice myself, and over 1/2 of the patients I see are purely for bone and joint problems.  If she would like to see a surgeon, schedule a follow-up with Dr. Dorthula Nettles at [facility] or if she prefers a different practice, any of the Orthopedists at [facility].  cc: Dr. Ermalene Searing Follow-up by: Hannah Beat MD,  February 13, 2010 3:19 PM  Additional Follow-up for Phone Call Additional follow up Details #1::        Tell pt I have complete trust in Dr. Patsy Lager when it comes to management of joint injuries.Marland Kitchenif despite this she wishes to get a second opinion, let me know.  Additional Follow-up by: Kerby Nora MD,  February 13, 2010 5:57 PM    Additional Follow-up for Phone Call Additional follow up Details #2::    per Dr Patsy Lager appt made with Dr Dion Saucier on 02/18/2010 at 8:45am. Follow-up by: Carlton Adam,  February 14, 2010 10:09 AM

## 2011-01-28 NOTE — Letter (Signed)
Summary: [name]  [facility]   Imported By: Lanelle Bal 01/14/2010 10:42:49  _____________________________________________________________________  External Attachment:    Type:   Image     Comment:   External Document

## 2011-01-28 NOTE — Progress Notes (Signed)
Summary: wants order for lab work  Phone Note Call from Patient   Caller: Patient Call For: Kerby Nora MD Summary of Call: Pt is coming in on 11/11 for a physical and is asking if she can have an order for labwork faxed to her at [facility], where she works. She can have the blood drawn there.   Fax is [phone] Initial call taken by: Lowella Petties CMA,  October 16, 2010 11:26 AM  Follow-up for Phone Call        in outbox Follow-up by: Kerby Nora MD,  October 17, 2010 8:10 AM  Additional Follow-up for Phone Call Additional follow up Details #1::        rx faxed.Consuello Masse CMA   Additional Follow-up by: Benny Lennert CMA Duncan Dull),  October 17, 2010 8:27 AM

## 2011-01-28 NOTE — Assessment & Plan Note (Signed)
Summary: EAR/CLE    Vital Signs:  Patient profile:   70 year old female Height:      61.5 inches Weight:      149.5 pounds BMI:     27.89 Temp:     97.9 degrees F oral Pulse rate:   80 / minute Pulse rhythm:   regular BP sitting:   108 / 80  (left arm) Cuff size:   regular  Vitals Entered By: Benny Lennert CMA Duncan Dull) (October 03, 2010 4:09 PM)  History of Present Illness: Chief complaint Ear  70 year old female:  left-sided tearfulness over the last week. No fevers, chills or sweat. Dull ache. No swimming, no pain in the canal.  ROS: As above, no nausea or vomiting. Eating and drinking normally.  GEN: Well-developed,well-nourished,in no acute distress; alert,appropriate and cooperative throughout examination HEENT: Normocephalic and atraumatic without obvious abnormalities. No apparent alopecia or balding. Ears, externally no deformities, L TM with serous fluid, no bulge or distortion of anatomy PULM: Breathing comfortably in no respiratory distress EXT: No clubbing, cyanosis, or edema PSYCH: Normally interactive. Cooperative during the interview. Pleasant. Friendly and conversant. Not anxious or depressed appearing. Normal, full affect.   Allergies: 1)  ! Penicillin 2)  ! * Ivp Dye 3)  ! Reglan (Metoclopramide Hcl)  Past History:  Past medical, surgical, family and social histories (including risk factors) reviewed, and no changes noted (except as noted below).  Past Medical History: LOW BACK PAIN, CHRONIC (ICD-724.2) GENITAL HERPES (ICD-054.10) DEPRESSION (ICD-311) SPINAL STENOSIS (ICD-724.00)   Hemorrhoids Irritable Bowel Syndrome  Past Surgical History: Reviewed history from 07/25/2008 and no changes required. 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 father died age 101 prosatate  cancer, pancreatic cancer, alzheimer's, MI mother died age 90 ulcerative colitis, breast cancer 1 brother HTN, high chol no MI <age 41  Social History: Reviewed history from 10/08/2007 and no changes required. 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 Exam consistent with serous fluid only. Discussed symptomatic care. Patient given a  sample of her meds. Given that she is using an MAO inhibitor,, decongestants and even Afrin are contraindicated.  Complete Medication List: 1)  Adult Aspirin Ec Low Strength 81 Mg Tbec (Aspirin) .... Once daily 2)  Loperamide 1 Mg.  Marland Kitchen... 1 by mouth  a day 3)  Caltrate 600+d 600-400 Mg-unit Tabs (Calcium carbonate-vitamin d) .Marland Kitchen.. 1 by mouth two times a day 4)  Nardil 15 Mg Tabs (Phenelzine sulfate) .... Two by mouth every morning and 2 by mouth each evening 5)  Multivitamins Tabs (Multiple vitamin) .... Once daily 6)  Vitamin D 400 Unit Tabs (Cholecalciferol) .... Once daily 7)  Bl Garlic 400 Mg Tabs (Garlic-calcium) .... Once daily 8)  Gabapentin 300 Mg Caps (Gabapentin) .... 2 tablets po  two times a day 9)  Lovaza 1 Gm Caps (Omega-3-acid ethyl esters) .... Take 1capsules  by mouth two times a day 10)  Fosamax 70 Mg Tabs (Alendronate sodium) .... Take one tablet each week  Current Allergies (reviewed today): ! PENICILLIN ! * IVP DYE ! REGLAN (METOCLOPRAMIDE HCL)

## 2011-01-28 NOTE — Progress Notes (Signed)
Summary: gabapentin   Phone Note Refill Request Message from:  Patient on November 08, 2010 4:47 PM  Refills Requested: Medication #1:  GABAPENTIN 300 MG  CAPS 2 tablets po  two times a day [facility].      Prescriptions: GABAPENTIN 300 MG  CAPS (GABAPENTIN) 2 tablets po  two times a day  #360 x 3   Entered and Authorized by:   Kerby Nora MD   Signed by:   Kerby Nora MD on 11/08/2010   Method used:   Electronically to        [facility]* (retail)       [address]       [address] Shipping/mailing       [place], [place]  [address]       Ph: [phone]       Fax: [phone]   RxID:   [id number]

## 2011-01-28 NOTE — Letter (Signed)
Summary: Results Follow up Letter  [place] at [facility]  [address] [place], [place] [address]   Phone: [phone]  Fax: [phone]    11/15/2010 MRN: [id number]     [facility] [address] [place], [place]  [address]    Dear Ms. Steagall,  The following are the results of your recent test(s):  Test         Result    Pap Smear:        Normal __x___  Not Normal _____ Comments:Repeat in 1 year ______________________________________________________ Cholesterol: LDL(Bad cholesterol):         Your goal is less than:         HDL (Good cholesterol):       Your goal is more than: Comments:  ______________________________________________________ Mammogram:        Normal _____  Not Normal _____ Comments:  ___________________________________________________________________ Hemoccult:        Normal _____  Not normal _______ Comments:    _____________________________________________________________________ Other Tests:    We routinely do not discuss normal results over the telephone.  If you desire a copy of the results, or you have any questions about this information we can discuss them at your next office visit.   Sincerely,  Kerby Nora MD

## 2011-01-29 ENCOUNTER — Other Ambulatory Visit: Payer: Self-pay | Admitting: Oncology

## 2011-01-29 DIAGNOSIS — Z9889 Other specified postprocedural states: Secondary | ICD-10-CM

## 2011-01-30 NOTE — Miscellaneous (Signed)
   Clinical Lists Changes  Observations: Added new observation of B12: 602 pg/mL (01/14/2011 14:18) Added new observation of FERRITIN: 109 ng/mL (01/14/2011 14:18) Added new observation of IRON SATUR %: 19 % (01/14/2011 14:18) Added new observation of TIBC: 380 mcg/dL (04/54/0981 19:14) Added new observation of UIBC: 309 mcg/dL (78/29/5621 30:86) Added new observation of IRON: 71 mcg/dL (57/84/6962 95:28) Added new observation of PLATELETK/UL: 231 K/uL (01/14/2011 14:18) Added new observation of RDW: 13.1 % (01/14/2011 14:18) Added new observation of MCV: 88.6 fL (01/14/2011 14:18) Added new observation of HCT: 32.6 % (01/14/2011 14:18) Added new observation of HGB: 10.6 g/dL (41/32/4401 02:72) Added new observation of RBC M/UL: 3.68 M/uL (01/14/2011 14:18) Added new observation of WBC COUNT: 4.8 10*3/microliter (01/14/2011 14:18)

## 2011-02-13 NOTE — Letter (Signed)
Summary: [facility]  [facility]   Imported By: Kassie Mends 02/05/2011 08:00:43  _____________________________________________________________________  External Attachment:    Type:   Image     Comment:   External Document

## 2011-03-07 ENCOUNTER — Telehealth: Payer: Self-pay | Admitting: Family Medicine

## 2011-03-12 ENCOUNTER — Telehealth: Payer: Self-pay | Admitting: Family Medicine

## 2011-03-18 NOTE — Progress Notes (Signed)
Summary: needs written scripts for mail order  Phone Note Refill Request Call back at [phone] Message from:  Patient  Refills Requested: Medication #1:  NARDIL 15 MG  TABS Two by mouth every morning and 2 by mouth each evening  Medication #2:  GABAPENTIN 300 MG  CAPS 2 tablets po  two times a day  Medication #3:  FOSAMAX 70 MG  TABS Take one tablet each week. Pt needs written 90 day scripts for mail order pharmacy, which she is new to.  Please call when ready.  Initial call taken by: Lowella Petties CMA, AAMA,  March 07, 2011 4:24 PM  Follow-up for Phone Call        Patient advised.Consuello Masse CMA   Follow-up by: Benny Lennert CMA Duncan Dull),  March 10, 2011 1:43 PM    Prescriptions: FOSAMAX 70 MG  TABS (ALENDRONATE SODIUM) Take one tablet each week  #12 x 3   Entered and Authorized by:   Kerby Nora MD   Signed by:   Kerby Nora MD on 03/07/2011   Method used:   Print then Give to Patient   RxID:   [id number] GABAPENTIN 300 MG  CAPS (GABAPENTIN) 2 tablets po  two times a day  #360 x 3   Entered and Authorized by:   Kerby Nora MD   Signed by:   Kerby Nora MD on 03/07/2011   Method used:   Print then Give to Patient   RxID:   [id number] NARDIL 15 MG  TABS (PHENELZINE SULFATE) Two by mouth every morning and 2 by mouth each evening  #360 x 3   Entered and Authorized by:   Kerby Nora MD   Signed by:   Kerby Nora MD on 03/07/2011   Method used:   Print then Give to Patient   RxID:   [id number]

## 2011-03-18 NOTE — Progress Notes (Signed)
Summary: ? going out of the country  Phone Note Call from Patient   Caller: Patient Call For: Kerby Nora MD Summary of Call: [place] wants to know if she needs any antibiotic she already h as appt at health dept for immunizations Initial call taken by: Benny Lennert CMA Duncan Dull),  March 12, 2011 4:40 PM  Follow-up for Phone Call        Tell her to look at website [email or web address]... they give great advice on how to stay safe from infection  in each individual country and specific provinces... health dept will provide any oral antibitoics as well and immunizations. Follow-up by: Kerby Nora MD,  March 12, 2011 11:04 PM  Additional Follow-up for Phone Call Additional follow up Details #1::        Patient advised.Consuello Masse CMA   Additional Follow-up by: Benny Lennert CMA Duncan Dull),  March 13, 2011 8:08 AM

## 2011-05-05 ENCOUNTER — Telehealth: Payer: Self-pay | Admitting: *Deleted

## 2011-05-05 MED ORDER — ZOSTER VACCINE LIVE 19400 UNT/0.65ML ~~LOC~~ SOLR: 0.6500 mL | Freq: Once | SUBCUTANEOUS | Status: AC

## 2011-05-05 NOTE — Telephone Encounter (Signed)
Patient has already had this 09-2009

## 2011-05-05 NOTE — Telephone Encounter (Signed)
Prescription printed and on printer. Per records it looks like she may have gotten this in 2010..but this may have also bee recording of her refusal. Just verify that she has not already received this in centricity.

## 2011-05-05 NOTE — Telephone Encounter (Signed)
Patient is asking if she can get a rx for the shingles vaccine. She would like to come by and pick this up to take to pharmacy.

## 2011-05-13 NOTE — Procedures (Signed)
NAMEAMALI, [facility] NO.:  [id number]   MEDICAL RECORD NO.:  [id number]           PATIENT TYPE:   LOCATION:                                 FACILITY:   PHYSICIAN:  Erick Colace, M.D.DATE OF BIRTH:  02/27/1941   DATE OF PROCEDURE:  07/03/2008  DATE OF DISCHARGE:                               OPERATIVE REPORT   PROCEDURE:  Bilateral L5 dorsal ramus injection, bilateral L4 medial  branch block, and bilateral L3 medial branch block under fluoroscopic  guidance.   INDICATION:  Low back, buttock, and posterior leg pain, left greater  than right, but she indicates that both sides really her for.  She has  had prior good relief of at least 1 week with facet injections, but has  not had medial branch blocks in the past.  Pain does interfere with self-  care and mobility including yard work.  Pain is only partially  responsive to medication management.   Informed consent was obtained after describing risks and benefits of the  procedure with the patient.  These include bleeding, bruising, and  infection.  She elects to proceed and has given written consent.  She is  not on any anticoagulants at the current time.  No antibiotics.   The patient was placed prone on fluoroscopy table.  Betadine prep,  sterile draped.  A 25-gauge 1-1/2 inch needle was used to anesthetize  skin and subcutaneous tissue, 1% lidocaine x2 mL and a 22-gauge 3-1/2  inch spinal needle was inserted first targeting left S1 SAP sacral ala  junction bone contact made confirmed with lateral imaging followed by  injection of 0.5 mL of solution containing 1 mL of 4 mg/mL dexamethasone  and 2 mL of 2% MPF lidocaine.  The left L5 SAP transverse process  junction targeted bone contact made confirmed with lateral imaging, 0.5  mL of dexamethasone lidocaine solution was injected.  Then, the left L4  SAP transverse process junction targeted bone contact made confirmed  with lateral imaging, 0.5 mL  dexamethasone lidocaine solution was  injected.  The same procedure was performed on the right side at  corresponding levels using same needle, injectate, and technique.  The  patient tolerated procedure well.  Post injection vitals stable.  Approximately 30% pain relief on the right side and 100% pain relief of  left side.  She will follow up Dr. Pamelia Hoit next month and consider  reinjection should positive results persist over the next day or two.   No post injection problems.  She did receive her postinjection  instructions.      Erick Colace, M.D.  Electronically Signed     AEK/MEDQ  D:  07/03/2008 16:21:46  T:  07/04/2008 03:10:07  Job:  [id number]   cc:   Brantley Stage, M.D.  Fax: [phone]

## 2011-05-13 NOTE — Group Therapy Note (Signed)
[name] is a kind referral from Kerby Nora, M.D.  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   [name] states that she did not have any problems with her back or  leg until about a year ago when she was at a flea market.  She was  carrying some heavy items and was twisting.  After that event she noted  her back gradually got worse and worse and was rather quite severe in  October of 2008.  She has seen two surgeons over that period of time,  Payton Doughty, M.D., as well as Dr. Marcello Moores who is at [facility].  Both have  recommended surgical procedure for her.  She has had injections into the  back by Dr. Eduard Clos, who apparently has done some facet injections per  patient history.  [name] states that the facet injections helped  for about a week and she was quite happy with the results.  However,  they wore off and her back pain and leg pain returned.   She denies any numbness in the legs.  She denies tingling.  She denies  weakness.  She has not had any falls.  Her chief complaint is mainly  posterior leg pain.  She states that pain in the room today is about a 2  on a scale of 10.  However, her average pain is about a 7.  The pain is  described as sharp, moderately interfering with her activity, and  significantly interfering with enjoyment of life.  The pain is worse in  the morning.  She sleeps fairly well.  The pain improves with sitting in  general.  She is getting good relief with current medications that she  uses for pain which includes Gabapentin 300 mg one q.a.m. and one q.p.m.  and two before bed.   She is really not taking anything else over-the-counter for pain  medication.   With respect to her functioning status, she is a high functioning  individual.  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  She is independent with all of  her self-care.  She is able to walk a variable amount of time.  Some  days she is able to be up quite a bit and other days she can only walk a  few minutes.  She is able to climb stairs and drive.  She reports that  she can walk a little further when she does use a shopping cart.   She denies problems controlling her bowel or bladder.  Denies weakness,  numbness, tingling, and tremors.  She denies anxiety and depression and  states that she is not suicidal.   She does admit of a weight gain of about 10 pounds since last Fall and  is interested in returning back to some sort of exercise program.   PAST MEDICAL HISTORY:  Negative for heart disease, kidney or liver  disease, and hypertension, diabetes.   PAST SURGICAL HISTORY:  Positive for hysterectomy, tonsillectomy, and  states that she had a right leg fracture when she was 70 years old which  was fairly significant.   SOCIAL HISTORY:  She is divorced.  She has some animals  that she lives  with.  She denies illegal substance use.  She occasionally drinks  alcohol with dinner.  She denies smoking.   FAMILY HISTORY:  Positive for hypertension, psychiatric problems.  Her  mother died at age 72 of old age.  Her father died at age 62 as well  of pancreatic cancer and prostate cancer.  She has one daughter who has  ulcerative colitis.  She has a brother who is 61 and healthy.   MEDICATIONS:  1. Aspirin.  2. Gabapentin.  3. Fosamax.  4. Calcium.  5. Vitamin D.  6. Nardil.  7. Lovenox.  8. Lipoamide.  9. Lorazepam.   ALLERGIES:  IVP DYE, PENICILLIN, REGLAN.   PHYSICAL EXAMINATION:  VITAL SIGNS:  Blood pressure 125/59, pulse 70,  respirations 18, 97% saturated on room air.  GENERAL:  She is a well-developed, well-nourished, female who appears  her stated age.  She is oriented x3.  Her  speech is clear.  Her affect  is bright.  She is alert, cooperative, and pleasant.  She follows  commands without any difficulty.   She is able to transition easily from sitting to standing.  Her gait in  the room is symmetric and nonantalgic.  She appears to have a slightly  larger right calf than her left.  Measuring this displays a 1 cm  difference at 13 cm below the inferior patellar pole, 38 cm on the right  and 37 cm on the left.  No edema is noted.   She has limitations in lumbar motion.  She does seem to display some  darting during these movements today.  She has intact sensation to  pinprick, light touch, as well as proprioception in the lower  extremities.  Her reflexes are symmetric and intact in the lower  extremities at patellar and Achilles tendons.  There is no abnormal tone  and no clonus is noted.  5/5 motor strength is noted.  Hip flexors, knee  extensors, dorsiflexors, EHL.   Palpation over the iliotibial band and trochanters does not reveal  tenderness.  She has good internal and external rotation in both hips  without pain as well.   Mild tenderness to palpation in the lumbar paraspinal musculature is  noted.   IMPRESSION:  1. Lumbar spondylosis.  2. 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  3. L3-4 spinal stenosis.  4. Predominantly right lower extremity pain.  5. Lumbago.  6. Depression on Nardil.   PLAN:  We discussed various treatment options with her today.  We will  increase her Neurontin slightly.  We will add one more dose as she takes  her initial dose at 6:30 and her next dose at about 3 o'clock which is  quite a long time in between, almost 8-1/2 to 9 hours.  We will allow  her another dose toward the later morning to bring her Neurontin dose up  to 300 mg three times a day and 2 tablets at night.   We discussed treatment options with respect to her back.  She has done  well in the past with  facet injections.  We would like to consider  radiofrequency neurotomy, however, we will start out with medial branch  blocks on the right lumbar spine.   She has concerns about weight loss and increasing her activity level.  To that end we will get her started back into a physical therapy program  with an  emphasis on lumbar stabilization and eventually progressing her  to lower  extremity program that is community related.  She does have a membership  to [facility] and I would like to get her transitioned over there again.  We  will have her follow back up in our clinic in 1 month.           ______________________________  Brantley Stage, M.D.     DMK/MedQ  D:  05/15/2008 13:29:09  T:  05/15/2008 14:02:01  Job #:  [id number]   cc:   Kerby Nora, MD

## 2011-05-13 NOTE — Assessment & Plan Note (Signed)
Adrienne [name] is a very pleasant 70 year old woman who is referred by Dr.  Kerby Nora.  She was last seen by me on May 15, 2008.  At her initial  visit, she had predominantly complaints of low back pain, right buttock,  and lower extremity pain.  Her pain scores at the initial visit, she  reported in average range of 7 on a scale of 10 with pain significantly  interfering with enjoyment of life and moderately with her activity  levels.   In the interim, she is attending physical therapy and has started taking  her Neurontin 300 mg 2 p.o. q.a.m. and 2 p.o. q.p.m. before bed.  She  has also undergone bilateral L5 dorsal ramus injection and bilateral L4  medial branch block and bilateral L3 medial branch block on July 03, 2008.   Her current pain scores are on the average between a 2 and 3 on the  scale of 10.  She reports no interference of her general activity at  this time with her pain and her enjoyment of life is not at all  interfered with as well.  She reports overall increasing her activity  level.  She is using the [employer] and doing bicycle, treadmill, and  upper extremity strengthening.  She has started traveling again.  Her  past medical history is complicated in the interim by a new diagnosis of  ductile carcinoma in situ.  Apparently, she is set up for some radiation  treatment.   She reports good relief currently with the Neurontin.  Her functional  status is very high.  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  She is independent with all of her self-care, is able to climb stairs  and drive, and as noted earlier, she is also involved [facility] an [facility]  program [facility].   She reports no changes regarding review of systems other than that  already indicated.  No changes in social and family history since her  last visit.   PHYSICAL EXAMINATION:  Her blood pressure is 146/77, pulse 78,  respirations 18, and 98% saturated on room air.  She is a well-  developed,  well-nourished female, who does not appear in any distress.  She is oriented x3.  Her speech is clear.  Her affect is bright.  She is  alert, cooperative, and pleasant.  Follows commands without difficulty.  Cranial nerves are grossly intact.  Coordination is intact.  Reflexes  are symmetrically intact in the lower extremities.  No sensory deficits  are appreciated in the lower extremities.   Her range of motion in the lumbar spine reveals limitations in forward  flexion as well as extension.  She has minimal tenderness to palpation  in the lumbar paraspinal musculature.  No pain with internal or external  rotation of her hips.  Manual muscle testing reveals 5/5 strength in the  lower extremities at hip flexors, knee extensors, dorsiflexors, and  plantar flexors.   IMPRESSION:  1. Lumbar spondylosis.  2. Multifactorial spinal stenosis at L4-L5 secondary to disk as well      as facet overgrowth.  3. L3-L4 spinal stenosis.  4. Predominantly right lower extremity pain, overall improved.  5. Lumbago.  6. Depression.   PLAN:  In the last couple of months, Adrienne [name] pain scores have  dramatically improved.  Initially, she has reported an average pain  score of 7 on the scale of 10.  Currently, she is at 2-3  on a scale of  10.  She reported a significant interference with activity and enjoyment  of life.  Currently, she reports no interference of her pain with  activity or enjoyment of life.   She states that she believes the Neurontin is helping quite a bit.  She  reported some relief with the medial branch block she underwent on July 03, 2008.  Given her improvement, we would make her candidate for  radiofrequency neurotomy at these levels.  However, she is doing very  well currently, just on the Neurontin at this time.  I reviewed the  risks and benefits of using Neurontin.  As she would like to continue to  use this medication, I have written a prescription for her today 300 mg   2 p.o. q.a.m. and 2 p.o. q.p.m., #120 with 3 refills.   She has overall improved her general activity, and currently able to  walk her dog, participate in physical therapy program, and continue  working 40 hours a week.  At this point, we will see her back in 6  months, and she will let us know if she needs this in the interim.           ______________________________  Brantley Stage, M.D.     DMK/MedQ  D:  08/09/2008 10:09:42  T:  08/10/2008 02:14:50  Job #:  [id number]   cc:   Kerby Nora, MD

## 2011-05-13 NOTE — Op Note (Signed)
Adrienne [name], Adrienne [name]             ACCOUNT NO.:  [id number]   MEDICAL RECORD NO.:  [id number]          PATIENT TYPE:  AMB   LOCATION:  DSC                          FACILITY:  [facility]   PHYSICIAN:  Thomas A. Cornett, M.D.DATE OF BIRTH:  [birth year]   DATE OF PROCEDURE:  07/18/2008  DATE OF DISCHARGE:                               OPERATIVE REPORT   PREOPERATIVE DIAGNOSIS:  Left breast atypical microcalcifications.   POSTOPERATIVE DIAGNOSIS:  Left breast atypical microcalcifications.   PROCEDURE:  Left breast needle-localized lumpectomy.   SURGEON:  Maisie Fus A. Cornett, MD   ANESTHESIA:  LMA with 0.25% Sensorcaine local.   ESTIMATED BLOOD LOSS:  40 mL.   SPECIMEN:  Left breast tissue localizing wire and calcifications  verified by radiography.   INDICATIONS FOR PROCEDURE:  The patient is a 70 year old female with  left breast microcalcifications that are atypical by mammography.  These  were quite deep and required an excisional biopsy for diagnosis.  She  presents today for that.   DESCRIPTION OF PROCEDURE:  After undergoing left breast wire  localization, the patient was brought to the operating room.  Left  breast was then prepped and draped in sterile fashion after induction of  LMA anesthesia.  Sensorcaine 0.25% was used for local anesthesia.  Curvilinear incision was made around the wire.  All tissue around the  tip of wire was excised in its entirety.  Radiographs revealed the  calcifications to be present in the specimen with localizing wire  intact.  There was some oozing from the pectoralis major muscle.  I  oversewed this with 3-0 Vicryl.  Irrigation was used and suctioned out  and this was clear.  The cavity was closed in layers using deep layer of  3-0 Vicryl and a 4-0 Monocryl subcuticular stitch.  Dermabond was  applied as dressing.  All final counts of sponge, needle, and instrument  were found to be correct at this portion of the case.  The patient was  awoke and  taken to recovery in satisfactory condition.  All final counts  were done at second time found to be correct.      Thomas A. Cornett, M.D.  Electronically Signed     TAC/MEDQ  D:  07/18/2008  T:  07/19/2008  Job:  [id number]   cc:   Kerby Nora, MD

## 2011-05-13 NOTE — Assessment & Plan Note (Signed)
[facility]                         GASTROENTEROLOGY OFFICE NOTE   NAME:[name], [name]                    MRN:          [id number]  DATE:05/28/2007                            DOB:          03/08/1941    Adrienne [name] is a 70 year old white female who had a virtual colonoscopy  for colorectal screening on April 26, 2007 with findings of persistent  area of narrowing in the proximal sigmoid colon with some nodularity on  two images.  There were no polyps and no definite diverticula.  There  was no definite mass, and no other abnormality was noted.  We saw [name] in 1997 for screening colonoscopy which was done on October 25, 1996 with findings of normal colon to the cecum.  She has a family  history of ulcerative colitis in her mother, who had an ileostomy.  Also, her daughter was diagnosed with ulcerative colitis.  She is now 68  years old, and we diagnosed her at the age of 51.   Adrienne [name] is asymptomatic.  Her bowel habits are regular.  She denies  any abdominal pain, rectal bleeding, but she has intermittent diarrhea,  for which she takes Imodium.  She had a rectocele repair about nine  years ago.   PHYSICAL EXAMINATION:  VITAL SIGNS:  Blood pressure 108/56, pulse 80.  Weight 152 pounds.  ABDOMEN:  Unremarkable.  Soft abdomen.  Minimal discomfort in the left  lower quadrant, no tenderness.  No mass.  Right lower quadrant was  normal.  RECTAL:  Normal rectal tone.  Stool was soft and hemoccult negative.   IMPRESSION:  A 70 year old white female with positive family history of  inflammatory bowel disease in mother and daughter as well.  She has an  abnormal virtual colonoscopy suggestive of narrowing in the sigmoid  colon, but she is Hemoccult negative and essentially asymptomatic.  It  is not clear that this is just a radiographic abnormality that has no  clinical correlation or if we are dealing with inflammatory bowel  disease.  Her  colonoscopy 11 years ago was normal, but she does have a  chronic diarrhea, and it is possible that she has a low-grade  inflammatory bowel disease in her sigmoid colon.   PLAN:  I would recommend to her to undergone colonoscopy to assess the  abnormal area of the sigmoid colon.  The patient agrees to the plan, but  her problem is transportation, since she does not have any family here.  The other problem is that she on Nardil for depression by Dr. Donell Beers,  which is not compatible with certain analgesics.  During her last  colonoscopy in 1997, patient went off Nardil and was sedated with Valium  and Demerol.  Today we are using Versed and fentanyl, and I need to ask  Dr. Donell Beers if it is possible for patient to remain on Nardil while  using versed and fentanyl.  After we get clarification of this, we will  go ahead and schedule the patient for a colonoscopy.     Hedwig Morton. Juanda Chance, MD  Electronically Signed  DMB/MedQ  DD: 05/28/2007  DT: 05/29/2007  Job #: [id number]   cc:   Archer Asa, M.D.  Luanna Cole. Lenord Fellers, M.D.

## 2011-05-19 ENCOUNTER — Encounter (INDEPENDENT_AMBULATORY_CARE_PROVIDER_SITE_OTHER): Payer: Self-pay | Admitting: Surgery

## 2011-05-27 ENCOUNTER — Encounter: Payer: Self-pay | Admitting: Family Medicine

## 2011-05-28 ENCOUNTER — Ambulatory Visit (INDEPENDENT_AMBULATORY_CARE_PROVIDER_SITE_OTHER): Payer: Medicare Other | Admitting: Family Medicine

## 2011-05-28 ENCOUNTER — Encounter: Payer: Self-pay | Admitting: Family Medicine

## 2011-05-28 VITALS — BP 130/76 | HR 80 | Temp 99.0°F | Ht 61.5 in | Wt 148.0 lb

## 2011-05-28 DIAGNOSIS — J40 Bronchitis, not specified as acute or chronic: Secondary | ICD-10-CM

## 2011-05-28 MED ORDER — GUAIFENESIN-CODEINE 100-10 MG/5ML PO SYRP: 5.0000 mL | ORAL_SOLUTION | Freq: Two times a day (BID) | ORAL | Status: DC | PRN

## 2011-05-28 MED ORDER — ALBUTEROL SULFATE HFA 108 (90 BASE) MCG/ACT IN AERS: 2.0000 | INHALATION_SPRAY | Freq: Four times a day (QID) | RESPIRATORY_TRACT | Status: DC | PRN

## 2011-05-28 MED ORDER — AZITHROMYCIN 250 MG PO TABS: ORAL_TABLET | ORAL | Status: AC

## 2011-05-28 NOTE — Progress Notes (Signed)
  Subjective:    Patient ID: Adrienne [name], female    DOB: 1941/10/11, 70 y.o.   MRN: [id number]  HPI CC: cough  3wk h/o chest congestion.  Started very tight in chest, starting to loosen up now.  Cough productive of yellow sputum.  So far has tried chicken soup and gingerale and tylenol.  Nothing helping.  Able to sleep but cough occasionally wakes her up.  Coughing to point of voiding.  Feeling very fatigued as well.  No HA, sinus congestion, ST, allergy sxs, fevers/chills, abd pain, n/v, rashes, night sweats.  No ear pain or tooth pain.  No sick contacts at home.  No smokers at home.  No h/o asthma/allergies.  On MAOI for h/o depression.  H/o breast cancer, currently in remission.  Did have recent mission trip to [place] but denies exposure to anyone sick or coughing there.  Review of Systems Per HPI    Objective:   Physical Exam  Vitals reviewed. Constitutional: She appears well-developed and well-nourished. No distress.  HENT:  Head: Normocephalic and atraumatic.  Right Ear: Hearing, tympanic membrane, external ear and ear canal normal.  Left Ear: Hearing, tympanic membrane, external ear and ear canal normal.  Nose: No mucosal edema or rhinorrhea. Right sinus exhibits no maxillary sinus tenderness and no frontal sinus tenderness. Left sinus exhibits no maxillary sinus tenderness and no frontal sinus tenderness.  Mouth/Throat: Uvula is midline, oropharynx is clear and moist and mucous membranes are normal. No oropharyngeal exudate.  Eyes: Conjunctivae and EOM are normal. Pupils are equal, round, and reactive to light. No scleral icterus.  Neck: Normal range of motion. Neck supple. No JVD present. No thyromegaly present.  Cardiovascular: Normal rate, regular rhythm, normal heart sounds and intact distal pulses.   No murmur heard. Pulmonary/Chest: Effort normal. No respiratory distress. She has no wheezes. She has rhonchi. She has no rales.       insp wheeze, exp rhonchi  throughout but mainly L lobe  Lymphadenopathy:    She has no cervical adenopathy.  Skin: Skin is warm and dry. No rash noted.          Assessment & Plan:

## 2011-05-28 NOTE — Assessment & Plan Note (Signed)
3 wk duration, seems to be improving.  Treat with zpack and cough syrup. If not improving as expected, asked pt to return for CXR.  Current cough seems to be improving, so did not check CXR today.  Discussed importance of return if not better. Initially prescribed albuterol given some wheezing, but reviewing MAOI interaction, prefer to avoid this combination.  Called pharmacy and cancelled order.

## 2011-05-28 NOTE — Patient Instructions (Addendum)
I think you have bronchitis with some wheezing. Take course of azithromycin. Use albuterol to help open up airways - watch with phenelzine can cause rapid heart rate and headache.  If that happens, stop albuterol. cough syrup for night time. Push fluids and plenty of rest. If fever >101, or worsening cough, please return to be seen. If not improving as expected, please return for xray. Good to meet you today.  Call us with questions.

## 2011-06-27 ENCOUNTER — Other Ambulatory Visit: Payer: Self-pay | Admitting: Oncology

## 2011-06-27 DIAGNOSIS — Z853 Personal history of malignant neoplasm of breast: Secondary | ICD-10-CM

## 2011-06-27 DIAGNOSIS — Z9889 Other specified postprocedural states: Secondary | ICD-10-CM

## 2011-06-30 ENCOUNTER — Ambulatory Visit
Admission: RE | Admit: 2011-06-30 | Discharge: 2011-06-30 | Disposition: A | Discharge status: Home / Self Care | Payer: Medicare Other | Source: Ambulatory Visit | Attending: Oncology | Admitting: Oncology

## 2011-06-30 DIAGNOSIS — Z9889 Other specified postprocedural states: Secondary | ICD-10-CM

## 2011-06-30 LAB — HM MAMMOGRAPHY: HM Mammogram: NORMAL

## 2011-07-17 ENCOUNTER — Encounter (INDEPENDENT_AMBULATORY_CARE_PROVIDER_SITE_OTHER): Payer: Self-pay | Admitting: General Surgery

## 2011-07-17 ENCOUNTER — Encounter: Payer: Self-pay | Admitting: *Deleted

## 2011-07-18 ENCOUNTER — Encounter (INDEPENDENT_AMBULATORY_CARE_PROVIDER_SITE_OTHER): Payer: Self-pay | Admitting: Surgery

## 2011-07-18 ENCOUNTER — Ambulatory Visit (INDEPENDENT_AMBULATORY_CARE_PROVIDER_SITE_OTHER): Payer: Medicare Other | Admitting: Surgery

## 2011-07-18 DIAGNOSIS — Z853 Personal history of malignant neoplasm of breast: Secondary | ICD-10-CM

## 2011-07-18 NOTE — Progress Notes (Signed)
Subjective:     Patient ID: Adrienne [name], female   DOB: 1941-09-26, 70 y.o.   MRN: [id number]  HPI The patient returns today for a one year followup. He had a left breast lumpectomy for DCIS back in 2009. Complete her radiation therapy. She is doing well. Denies any new breast mass, or changes in either nipple.   Review of Systems  Constitutional: Negative.   HENT: Negative.   Eyes: Negative.   Respiratory: Negative.   Cardiovascular: Negative.   Gastrointestinal: Negative.   Genitourinary: Negative.   Musculoskeletal: Negative.   Neurological: Negative.   Hematological: Negative.   Psychiatric/Behavioral: Negative.        Objective:   Physical Exam  Constitutional: She appears well-developed and well-nourished.  HENT:  Head: Normocephalic and atraumatic.  Nose: Nose normal.  Eyes: EOM are normal. Pupils are equal, round, and reactive to light.  Neck: Normal range of motion. Neck supple.  Cardiovascular: Normal rate, regular rhythm and normal heart sounds.   Pulmonary/Chest: Breath sounds normal.       Left breast shows postsurgical changes. The left breast nipple is inverted. This is not new. No left axillary mass is noted. No new masses in left breast noted. Right breast is normal. Right axilla normal.  Lymphadenopathy:    She has no cervical adenopathy.  Skin: Skin is warm and dry.       Assessment:     Left breast DCIS date of surgery 2009.    Plan:     Her exam is stable. She is supposed to be MRI of her breast per Dr. Cyndie Chime. I'm not sure what this was for. Her mammogram at this point in time is normal from July 2012. I will see her back in one year.

## 2011-07-18 NOTE — Patient Instructions (Signed)
Follow up in 1 year.

## 2011-09-25 LAB — DIFFERENTIAL
Eosinophils Relative: 6 — ABNORMAL HIGH
Lymphocytes Relative: 48 — ABNORMAL HIGH
Lymphs Abs: 2.4

## 2011-09-25 LAB — CBC
HCT: 33.4 — ABNORMAL LOW
Platelets: 255
RBC: 3.84 — ABNORMAL LOW
WBC: 5.1

## 2011-09-25 LAB — BASIC METABOLIC PANEL
BUN: 12
Calcium: 9.4
Chloride: 104
GFR calc Af Amer: >60
GFR calc non Af Amer: >60
Glucose, Bld: 156 — ABNORMAL HIGH
Sodium: 142

## 2011-10-30 ENCOUNTER — Ambulatory Visit: Payer: Medicare Other

## 2012-01-02 ENCOUNTER — Telehealth: Payer: Self-pay | Admitting: Oncology

## 2012-01-02 NOTE — Telephone Encounter (Signed)
CONVERTED 1/15 MOS[facility] APPT TO EPIC. NO AVAIL JAN PER JG OK TO MOVE TO MARCH. S/W PT RE CHANGE AND APPT FOR 3/12.

## 2012-01-06 ENCOUNTER — Other Ambulatory Visit: Payer: Self-pay | Admitting: Oncology

## 2012-01-06 ENCOUNTER — Other Ambulatory Visit (HOSPITAL_BASED_OUTPATIENT_CLINIC_OR_DEPARTMENT_OTHER): Payer: Medicare Other | Admitting: Lab

## 2012-01-06 DIAGNOSIS — D649 Anemia, unspecified: Secondary | ICD-10-CM

## 2012-01-06 LAB — CBC WITH DIFFERENTIAL/PLATELET
Eosinophils Absolute: 0.1 10*3/uL (ref 0.0–0.5)
LYMPH%: 43.5 % (ref 14.0–49.7)
MCV: 87.6 fL (ref 79.5–101.0)
MONO%: 7.4 % (ref 0.0–14.0)
NEUT#: 2.4 10*3/uL (ref 1.5–6.5)
Platelets: 313 10*3/uL (ref 145–400)
RBC: 3.64 10*6/uL — ABNORMAL LOW (ref 3.70–5.45)

## 2012-01-06 LAB — COMPREHENSIVE METABOLIC PANEL
Albumin: 4.5 g/dL (ref 3.5–5.2)
Alkaline Phosphatase: 52 U/L (ref 39–117)
BUN: 12 mg/dL (ref 6–23)
CO2: 27 mEq/L (ref 19–32)
Chloride: 101 mEq/L (ref 96–112)
Glucose, Bld: 95 mg/dL (ref 70–99)
Potassium: 4.7 mEq/L (ref 3.5–5.3)

## 2012-01-06 LAB — MORPHOLOGY: RBC Comments: NORMAL

## 2012-01-12 ENCOUNTER — Telehealth: Payer: Self-pay

## 2012-01-12 NOTE — Telephone Encounter (Signed)
Pt notified by phone per Dr Cyndie Chime - "labs stable" (from 1/8).  Pt verbalizes understanding and is aware of appt on 3/12. dph

## 2012-01-31 ENCOUNTER — Other Ambulatory Visit: Payer: Self-pay | Admitting: Family Medicine

## 2012-02-02 ENCOUNTER — Other Ambulatory Visit: Payer: Self-pay | Admitting: *Deleted

## 2012-02-02 MED ORDER — GABAPENTIN 300 MG PO CAPS: 300.0000 mg | ORAL_CAPSULE | Freq: Two times a day (BID) | ORAL | Status: DC

## 2012-02-02 MED ORDER — ALENDRONATE SODIUM 70 MG PO TABS: 70.0000 mg | ORAL_TABLET | ORAL | Status: DC

## 2012-02-02 NOTE — Telephone Encounter (Signed)
done

## 2012-02-02 NOTE — Telephone Encounter (Signed)
Patient called stating that her appointment for her physical has been changed because of Dr. Ermalene Searing being out. Patient needs refills on her medications until she can get in to be seen. Scripts needs to go to [facility] for Fosamax and Gabapentin.

## 2012-02-05 ENCOUNTER — Encounter: Payer: Medicare Other | Admitting: Family Medicine

## 2012-02-18 ENCOUNTER — Other Ambulatory Visit: Payer: Self-pay | Admitting: *Deleted

## 2012-02-18 MED ORDER — GABAPENTIN 300 MG PO CAPS: 600.0000 mg | ORAL_CAPSULE | Freq: Two times a day (BID) | ORAL | Status: DC

## 2012-02-20 ENCOUNTER — Telehealth: Payer: Self-pay

## 2012-02-20 NOTE — Telephone Encounter (Signed)
Patient called requesting a prescription for Doxycycline #53 to be called into [facility].  She is going to the [place] in a couple of weeks and is supposed to start taking the antibiotic before she goes.  Please call patient and let her know.

## 2012-02-21 NOTE — Telephone Encounter (Signed)
Is this for malaria prophylaxis? I am assuming so  If yes, then Doxycycline 100 mg, 1 po bid, #53

## 2012-02-23 ENCOUNTER — Other Ambulatory Visit: Payer: Self-pay | Admitting: Family Medicine

## 2012-02-23 MED ORDER — DOXYCYCLINE HYCLATE 100 MG PO TABS: 100.0000 mg | ORAL_TABLET | Freq: Two times a day (BID) | ORAL | Status: AC

## 2012-02-23 NOTE — Telephone Encounter (Signed)
Pt is needing Doxcycline??? She is going to a [place] trip and needs them As quickly as possible. [facility] [place].

## 2012-02-23 NOTE — Telephone Encounter (Signed)
rx sent in and patient advised  

## 2012-02-23 NOTE — Telephone Encounter (Signed)
Patient says that she just go messages that this has been taken care of.

## 2012-03-09 ENCOUNTER — Ambulatory Visit (HOSPITAL_BASED_OUTPATIENT_CLINIC_OR_DEPARTMENT_OTHER): Payer: Medicare PPO | Admitting: Oncology

## 2012-03-09 VITALS — BP 124/72 | HR 76 | Temp 97.9°F | Ht 61.5 in | Wt 148.6 lb

## 2012-03-09 DIAGNOSIS — D649 Anemia, unspecified: Secondary | ICD-10-CM

## 2012-03-09 DIAGNOSIS — D0590 Unspecified type of carcinoma in situ of unspecified breast: Secondary | ICD-10-CM

## 2012-03-09 DIAGNOSIS — D0510 Intraductal carcinoma in situ of unspecified breast: Secondary | ICD-10-CM

## 2012-03-09 NOTE — Progress Notes (Signed)
Hematology and Oncology Follow Up Visit  Adrienne [name] [id number] 06-06-1941 71 y.o. 03/09/2012 8:45 PM   Principle Diagnosis: Encounter Diagnoses  Name Primary?  Marland Kitchen DCIS (ductal carcinoma in situ) of breast Yes  . 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 She underwent lumpectomy followed by radiation. Tumor was 0.4 cm. ER/PR negative. She has had no evidence for new disease. Most recent mammograms were done in July 2012. She never noticed whether her left nipple which is now inverted was inverted prior to her surgery. She is also followed here for chronic normochromic anemia which has been stable over many years.  She has had no interim medical problems. She denies any headache or change in vision, no bone pain, no change in bowel habits, no vaginal bleeding.  Medications: reviewed  Allergies:  Allergies  Allergen Reactions  . Ivp Dye (Iodinated Diagnostic Agents)     CARDIAC ARREST  . Penicillins     REACTION: Anaphylactic Shock  . Reglan   . Metoclopramide Hcl Anxiety    REACTION: Rash    Review of Systems: Constitutional:   No constitutional symptoms Respiratory: No cough or dyspnea Cardiovascular:  No chest pain or palpitations Gastrointestinal: See above Genito-Urinary: See above Musculoskeletal: See above s Neurologic: See above  Skin: Remaining ROS negative.  Physical Exam: Blood pressure 124/72, pulse 76, temperature 97.9 F (36.6 C), temperature source Oral, height 5' 1.5" (1.562 m), weight 148 lb 9.6 oz (67.405 kg). Wt Readings from Last 3 Encounters:  03/09/12 148 lb 9.6 oz (67.405 kg)  05/28/11 148 lb 0.6 oz (67.151 kg)  11/08/10 150 lb (68.04 kg)     General appearance:  well-nourished Caucasian woman  HENNT:  pharynx no erythema or exudate  Lymph nodes:  no cervical supraclavicular or axillary adenopathy  Breasts:  surgical changes left breast with inversion of the nipple. No dominant mass in either breast  Lungs: clear to auscultation resonant to percussion  Heart: regular rhythm no murmur  Abdomen: soft nontender no mass no organomegaly  Extremities: no edema no calf tenderness  Vascular: no cyanosis  Neurologic: motor strength 5 over 5 reflexes 1+ symmetric  Skin:No rash or ecchymosis   Lab Results: Lab Results  Component Value Date   WBC 5.2 01/06/2012   HGB 10.7* 01/06/2012   HCT 31.8* 01/06/2012   MCV 87.6 01/06/2012   PLT 313 01/06/2012     Chemistry      Component Value Date/Time   NA 138 01/06/2012 1337   NA 138 01/06/2012 1337   NA 141 11/05/2009 0918   K 4.7 01/06/2012 1337   K 4.7 01/06/2012 1337   K 4.4 11/05/2009 0918   CL 101 01/06/2012 1337   CL 101 01/06/2012 1337   CL 98 11/05/2009 0918   CO2 27 01/06/2012 1337   CO2 27 01/06/2012 1337   CO2 30 11/05/2009 0918   BUN 12 01/06/2012 1337   BUN 12 01/06/2012 1337   BUN 15 11/05/2009 0918   CREATININE 0.77 01/06/2012 1337   CREATININE 0.77 01/06/2012 1337   CREATININE 0.6 11/05/2009 0918      Component Value Date/Time   CALCIUM 9.5 01/06/2012 1337   CALCIUM 9.5 01/06/2012 1337   CALCIUM 9.3 11/05/2009 0918   ALKPHOS 52 01/06/2012 1337   ALKPHOS 52 01/06/2012 1337   AST 24 01/06/2012 1337   AST 24  01/06/2012 1337   ALT 16 01/06/2012 1337   ALT 16 01/06/2012 1337   BILITOT 0.3 01/06/2012 1337   BILITOT 0.3 01/06/2012 1337       Impression and Plan: #1. DCIS left breast treated with surgery and radiation she remains free of any obvious new disease now out almost 4 years from diagnosis in July 2009. Plan continue annual exam and mammograms.  #2. Chronic normochromic anemia. Hemoglobin remains stable.  #3. History of macular degeneration.  #4. History of sciatica.  #5. Remote nephrolithiasis    CC:. Dr. Kerby Nora; Dr. Harriette Bouillon; Dr. Dorothy Puffer    Levert Feinstein, MD 3/12/20138:45 PM

## 2012-05-13 ENCOUNTER — Encounter: Payer: Self-pay | Admitting: *Deleted

## 2012-05-18 ENCOUNTER — Encounter: Payer: Self-pay | Admitting: Internal Medicine

## 2012-05-26 ENCOUNTER — Encounter (INDEPENDENT_AMBULATORY_CARE_PROVIDER_SITE_OTHER): Payer: Self-pay | Admitting: Surgery

## 2012-06-04 ENCOUNTER — Telehealth: Payer: Self-pay

## 2012-06-04 DIAGNOSIS — M899 Disorder of bone, unspecified: Secondary | ICD-10-CM

## 2012-06-04 DIAGNOSIS — Z1231 Encounter for screening mammogram for malignant neoplasm of breast: Secondary | ICD-10-CM

## 2012-06-04 NOTE — Telephone Encounter (Signed)
Pt left v/m requesting referral for 3 D mammogram and bone density if needed at breast center on 06/16/12, 06/17/12 or 06/18/12. Pt saw Dr Ermalene Searing 11/08/10 in [facility]. Pt has CPX scheduled 06/29/12.Please advise.

## 2012-06-09 ENCOUNTER — Other Ambulatory Visit: Payer: Self-pay | Admitting: Interventional Cardiology

## 2012-06-10 ENCOUNTER — Other Ambulatory Visit: Payer: Self-pay | Admitting: Family Medicine

## 2012-06-10 DIAGNOSIS — C50919 Malignant neoplasm of unspecified site of unspecified female breast: Secondary | ICD-10-CM

## 2012-06-29 ENCOUNTER — Encounter: Payer: Self-pay | Admitting: Family Medicine

## 2012-06-29 ENCOUNTER — Ambulatory Visit (INDEPENDENT_AMBULATORY_CARE_PROVIDER_SITE_OTHER): Payer: Medicare PPO | Admitting: Family Medicine

## 2012-06-29 VITALS — BP 120/62 | HR 88 | Temp 98.8°F | Ht 61.5 in | Wt 146.5 lb

## 2012-06-29 DIAGNOSIS — D649 Anemia, unspecified: Secondary | ICD-10-CM

## 2012-06-29 DIAGNOSIS — M899 Disorder of bone, unspecified: Secondary | ICD-10-CM

## 2012-06-29 DIAGNOSIS — M4800 Spinal stenosis, site unspecified: Secondary | ICD-10-CM

## 2012-06-29 DIAGNOSIS — F3289 Other specified depressive episodes: Secondary | ICD-10-CM

## 2012-06-29 DIAGNOSIS — M545 Low back pain, unspecified: Secondary | ICD-10-CM

## 2012-06-29 DIAGNOSIS — M949 Disorder of cartilage, unspecified: Secondary | ICD-10-CM

## 2012-06-29 DIAGNOSIS — C50919 Malignant neoplasm of unspecified site of unspecified female breast: Secondary | ICD-10-CM

## 2012-06-29 DIAGNOSIS — Z0000 Encounter for general adult medical examination without abnormal findings: Secondary | ICD-10-CM

## 2012-06-29 DIAGNOSIS — F329 Major depressive disorder, single episode, unspecified: Secondary | ICD-10-CM

## 2012-06-29 DIAGNOSIS — Z13220 Encounter for screening for lipoid disorders: Secondary | ICD-10-CM

## 2012-06-29 DIAGNOSIS — R21 Rash and other nonspecific skin eruption: Secondary | ICD-10-CM

## 2012-06-29 NOTE — Assessment & Plan Note (Signed)
Last cbc nml.

## 2012-06-29 NOTE — Progress Notes (Signed)
Subjective:    Patient ID: Adrienne [name], female    DOB: 1941/04/05, 71 y.o.   MRN: [id number]  HPI I have personally reviewed the Medicare Annual Wellness questionnaire and have noted 1. The patient's medical and social history 2. Their use of alcohol, tobacco or illicit drugs 3. Their current medications and supplements 4. The patient's functional ability including ADL's, fall risks, home safety risks and hearing or visual             impairment. 5. Diet and physical activities 6. Evidence for depression or mood disorders The patients weight, height, BMI and visual acuity have been recorded in the chart I have made referrals, counseling and provided education to the patient based review of the above and I have provided the pt with a written personalized care plan for preventive services.  Depression: Stable on Nardil. Using ativan prn.  Chronic back pain: well controlled with neurontin.   Retired no for the last 2 years. Staying busy. 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  Due for lab reveval. Lab Results  Component Value Date   CHOL 213 11/07/2010   HDL 79.0 11/07/2010   LDLCALC 113 11/07/2010   TRIG 107 11/07/2010   CHOLHDL 2.7 11/07/2010   Exercise: walks, stairs. Diet: moderate.   Review of Systems  Constitutional: Negative for fever, fatigue and unexpected weight change.  HENT: Negative for ear pain, congestion, sore throat, sneezing, trouble swallowing and sinus pressure.   Eyes: Negative for pain and itching.  Respiratory: Negative for cough, shortness of breath and wheezing.   Cardiovascular: Negative for chest pain, palpitations and leg swelling.  Gastrointestinal: Negative for nausea, abdominal pain, diarrhea, constipation and blood in stool.  Genitourinary: Negative for dysuria, hematuria, vaginal discharge and difficulty urinating.  Musculoskeletal: Positive for back pain.  Skin: Negative for rash.  Neurological: Negative for  syncope, weakness, light-headedness, numbness and headaches.  Psychiatric/Behavioral: Negative for confusion and dysphoric mood. The patient is not nervous/anxious.        Objective:   Physical Exam  Constitutional: Vital signs are normal. She appears well-developed and well-nourished. She is cooperative.  Non-toxic appearance. She does not appear ill. No distress.  HENT:  Head: Normocephalic.  Right Ear: Hearing, tympanic membrane, external ear and ear canal normal.  Left Ear: Hearing, tympanic membrane, external ear and ear canal normal.  Nose: Nose normal.  Eyes: Conjunctivae, EOM and lids are normal. Pupils are equal, round, and reactive to light. No foreign bodies found.  Neck: Trachea normal and normal range of motion. Neck supple. Carotid bruit is not present. No mass and no thyromegaly present.  Cardiovascular: Normal rate, regular rhythm, S1 normal, S2 normal, normal heart sounds and intact distal pulses.  Exam reveals no gallop.   No murmur heard. Pulmonary/Chest: Effort normal and breath sounds normal. No respiratory distress. She has no wheezes. She has no rhonchi. She has no rales.  Abdominal: Soft. Normal appearance and bowel sounds are normal. She exhibits no distension, no fluid wave, no abdominal bruit and no mass. There is no hepatosplenomegaly. There is no tenderness. There is no rebound, no guarding and no CVA tenderness. No hernia.  Genitourinary: No breast swelling, tenderness, discharge or bleeding. Pelvic exam was performed with patient prone.  Lymphadenopathy:    She has no cervical adenopathy.    She has no axillary adenopathy.  Neurological: She is alert. She has normal strength. No cranial nerve deficit or sensory deficit.  Skin: Skin is warm, dry and  intact. No rash noted.       Small dry patch on left side of nose, no erythema, no pustules.  Psychiatric: Her speech is normal and behavior is normal. Judgment normal. Her mood appears not anxious. Cognition and  memory are normal. She does not exhibit a depressed mood.          Assessment & Plan:  The patient's preventative maintenance and recommended screening tests for an annual wellness exam were reviewed in full today. Brought up to date unless services declined.  Counselled on the importance of diet, exercise, and its role in overall health and mortality. The patient's FH and SH was reviewed, including their home life, tobacco status, and drug and alcohol status.   Vaccines: Uptodate with PNA, Td and shingles Colon: Nml 2008, Dr. Juanda Chance, repeat in 5 years, has family history of ulcerative coltitis. 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 Cyndie Chime, has mammogram schedule. DEXA: osteopenia, on alendronate, Has scheduled. Nonsmoker Total hysterecomy, no pap indicated even though cervix.

## 2012-06-29 NOTE — Assessment & Plan Note (Signed)
Stale control on nardil and ativan.

## 2012-06-29 NOTE — Patient Instructions (Addendum)
Return for fasting labs in next few weeks. Call GI office to ask if stool cards adequate for screening instead of colonoscopy. Rash on nose: Apply OTC low dose steroid cream, if not improving in 2 weeks.  Return for further eval.

## 2012-06-29 NOTE — Assessment & Plan Note (Signed)
Due for re-eval. On fosamax.

## 2012-06-29 NOTE — Assessment & Plan Note (Signed)
Stable on neurontin.

## 2012-06-29 NOTE — Assessment & Plan Note (Signed)
Apply OTC low dose steroid cream, if not improving in 2 weeks.  Return for further eval.

## 2012-06-30 ENCOUNTER — Telehealth: Payer: Self-pay

## 2012-06-30 DIAGNOSIS — Z1212 Encounter for screening for malignant neoplasm of rectum: Secondary | ICD-10-CM

## 2012-06-30 NOTE — Telephone Encounter (Signed)
Pt called Dr Regino Schultze office and was told would have to get stool cards from Dr Daphine Deutscher office. Pt said difficult for her to go to [place] due to transportation for colonoscopy. Pt did not ask if stool cards were adequate screening instead of colonoscopy. Pt scheduled for lab at [facility] on 07/12/12 can she do stool cards or IFOB then.Please advise.

## 2012-07-02 ENCOUNTER — Ambulatory Visit
Admission: RE | Admit: 2012-07-02 | Discharge: 2012-07-02 | Disposition: A | Discharge status: Home / Self Care | Payer: Medicare PPO | Source: Ambulatory Visit | Attending: Family Medicine | Admitting: Family Medicine

## 2012-07-02 ENCOUNTER — Encounter: Payer: Self-pay | Admitting: *Deleted

## 2012-07-02 ENCOUNTER — Other Ambulatory Visit: Payer: Self-pay | Admitting: Family Medicine

## 2012-07-02 DIAGNOSIS — M949 Disorder of cartilage, unspecified: Secondary | ICD-10-CM

## 2012-07-02 DIAGNOSIS — C50919 Malignant neoplasm of unspecified site of unspecified female breast: Secondary | ICD-10-CM

## 2012-07-02 NOTE — Telephone Encounter (Signed)
Patient advised.

## 2012-07-02 NOTE — Telephone Encounter (Signed)
Yes .. She can byt o pick up stool cards. Order entered.

## 2012-07-05 ENCOUNTER — Other Ambulatory Visit: Payer: Self-pay | Admitting: Family Medicine

## 2012-07-05 DIAGNOSIS — Z1289 Encounter for screening for malignant neoplasm of other sites: Secondary | ICD-10-CM

## 2012-07-05 NOTE — Addendum Note (Signed)
Addended by: Alvina Chou on: 07/05/2012 09:36 AM   Modules accepted: Orders

## 2012-07-12 ENCOUNTER — Other Ambulatory Visit (INDEPENDENT_AMBULATORY_CARE_PROVIDER_SITE_OTHER): Payer: Medicare Other

## 2012-07-12 DIAGNOSIS — Z13220 Encounter for screening for lipoid disorders: Secondary | ICD-10-CM

## 2012-07-12 DIAGNOSIS — Z79899 Other long term (current) drug therapy: Secondary | ICD-10-CM

## 2012-07-12 LAB — LIPID PANEL
HDL: 77.3 mg/dL (ref >39.00)
Total CHOL/HDL Ratio: 3
VLDL: 18.4 mg/dL (ref 0.0–40.0)

## 2012-07-12 LAB — LDL CHOLESTEROL, DIRECT: Direct LDL: 130 mg/dL

## 2012-07-15 ENCOUNTER — Ambulatory Visit: Payer: Medicare Other | Admitting: Family Medicine

## 2012-07-15 DIAGNOSIS — R195 Other fecal abnormalities: Secondary | ICD-10-CM

## 2012-07-15 DIAGNOSIS — Z1289 Encounter for screening for malignant neoplasm of other sites: Secondary | ICD-10-CM

## 2012-07-15 LAB — FECAL OCCULT BLOOD, IMMUNOCHEMICAL: Fecal Occult Bld: POSITIVE

## 2012-07-19 ENCOUNTER — Telehealth: Payer: Self-pay | Admitting: Internal Medicine

## 2012-07-19 ENCOUNTER — Other Ambulatory Visit: Payer: Self-pay | Admitting: Family Medicine

## 2012-07-19 DIAGNOSIS — K921 Melena: Secondary | ICD-10-CM

## 2012-07-19 NOTE — Telephone Encounter (Signed)
Spoke with Shirlee Limerick and scheduled patient on 07/21/12 at 2:45 PM.

## 2012-07-19 NOTE — Patient Instructions (Signed)
Sent in referral

## 2012-07-20 ENCOUNTER — Encounter: Payer: Self-pay | Admitting: *Deleted

## 2012-07-21 ENCOUNTER — Encounter: Payer: Self-pay | Admitting: Internal Medicine

## 2012-07-21 ENCOUNTER — Ambulatory Visit (INDEPENDENT_AMBULATORY_CARE_PROVIDER_SITE_OTHER): Payer: Medicare PPO | Admitting: Internal Medicine

## 2012-07-21 VITALS — BP 134/72 | HR 72 | Ht 61.5 in | Wt 145.2 lb

## 2012-07-21 DIAGNOSIS — D509 Iron deficiency anemia, unspecified: Secondary | ICD-10-CM

## 2012-07-21 DIAGNOSIS — R195 Other fecal abnormalities: Secondary | ICD-10-CM

## 2012-07-21 MED ORDER — MOVIPREP 100 G PO SOLR: ORAL | Status: DC

## 2012-07-21 NOTE — Patient Instructions (Addendum)
You have been scheduled for a colonoscopy with propofol. Please follow written instructions given to you at your visit today.  Please pick up your prep kit at the pharmacy within the next 1-3 days. If you use inhalers (even only as needed), please bring them with you on the day of your procedure. CC: Dr Kerby Nora

## 2012-07-21 NOTE — Progress Notes (Signed)
Adrienne [name] 01-03-1941 MRN [id number]  History of Present Illness:  This is a 71 year old white female with positive home stool test for occult blood from 07/15/2012. She has chronic anemia followed by a hematologist. Her hemoglobin has been around 10.6 or 10.7 with an MCV of 87 and iron saturation of 19%. Her B12 has been normal. She has a family history of ulcerative colitis in her mother and in her daughter who underwent a total colectomy and ileoproctostomy. Patient had a full colonoscopy in 1997 and again in August 2008 which was normal. She has an incompetent rectal sphincter and has occasional leakage of stool. She had a rectocele repair in the remote past.   Past Medical History  Diagnosis Date  . Lumbago   . Genital herpes, unspecified   . Depressive disorder, not elsewhere classified   . Spinal stenosis, unspecified region other than cervical   . Anemia, unspecified   . Breast cancer   . Disorder of bone and cartilage, unspecified   . Macular degeneration   . Bronchitis   . External hemorrhoids   . IBS (irritable bowel syndrome)   . Depression   . Bipolar affective disorder    Past Surgical History  Procedure Date  . Abdominal hysterectomy     Total but cervix left  . Breast lumpectomy 6/09    Left; high grade carcinoma in situ  . Rectocele repair   . Mri lumbar 05/2007    L4-L5 spinal stenosis  . Bilateral tuboplasty   . Tonsillectomy   . Dilation and curettage of uterus     x 2    reports that she has never smoked. She has never used smokeless tobacco. She reports that she drinks alcohol. She reports that she does not use illicit drugs. family history includes Alzheimer's disease in her father; Breast cancer in her mother; Heart attack in her father; Hyperlipidemia in her brother; Hypertension in her brother; Pancreatic cancer in her father; Prostate cancer in her father; and Ulcerative colitis in her daughter and mother. Allergies  Allergen Reactions  . Ivp  Dye (Iodinated Diagnostic Agents)     CARDIAC ARREST  . Metoclopramide Hcl   . Penicillins     REACTION: Anaphylactic Shock  . Metoclopramide Hcl Anxiety    REACTION: Rash        Review of Systems: Denies heartburn indigestion abdominal pain  The remainder of the 10 point ROS is negative except as outlined in H&P   Physical Exam: General appearance  Well developed, in no distress. Eyes- non icteric. HEENT nontraumatic, normocephalic. Mouth no lesions, tongue papillated, no cheilosis. Neck supple without adenopathy, thyroid not enlarged, no carotid bruits, no JVD. Lungs Clear to auscultation bilaterally. Cor normal S1, normal S2, regular rhythm, no murmur,  quiet precordium. Abdomen: Soft relaxed abdomen with normoactive bowel sounds. Liver edge at costal margin. No distention. No tenderness. Rectal: Decreased rectal sphincter tone. External hemorrhoidal tags. Small amount of Hemoccult negative stool. Extremities no pedal edema. Skin no lesions. Neurological alert and oriented x 3. Psychological normal mood and affect.  Assessment and Plan:  Problem #1 I was unable to reproduce her Hemoccult-positive stool today. Patient has a history of chronic anemia due to chronic disease and mild iron deficiency. There is a strong history of ulcerative colitis in her daughter and her mother. Her last colonoscopy was 5 years ago. We will proceed with a colonoscopy for evaluation of Hemoccult-positive stool. I have discussed the prep and sedation with the patient.  07/21/2012 Lina Sar

## 2012-07-28 ENCOUNTER — Encounter: Payer: Self-pay | Admitting: Internal Medicine

## 2012-07-28 ENCOUNTER — Ambulatory Visit (AMBULATORY_SURGERY_CENTER): Payer: Medicare PPO | Admitting: Internal Medicine

## 2012-07-28 VITALS — BP 126/65 | HR 93 | Temp 96.6°F | Resp 14 | Ht 61.0 in | Wt 145.0 lb

## 2012-07-28 DIAGNOSIS — Z1211 Encounter for screening for malignant neoplasm of colon: Secondary | ICD-10-CM

## 2012-07-28 DIAGNOSIS — Z800 Family history of malignant neoplasm of digestive organs: Secondary | ICD-10-CM

## 2012-07-28 DIAGNOSIS — R195 Other fecal abnormalities: Secondary | ICD-10-CM

## 2012-07-28 MED ORDER — SODIUM CHLORIDE 0.9 % IV SOLN: 500.0000 mL | INTRAVENOUS | Status: DC

## 2012-07-28 NOTE — Patient Instructions (Addendum)
YOU HAD AN ENDOSCOPIC PROCEDURE TODAY AT THE [facility]: Refer to the procedure report that was given to you for any specific questions about what was found during the examination.  If the procedure report does not answer your questions, please call your gastroenterologist to clarify.  If you requested that your care partner not be given the details of your procedure findings, then the procedure report has been included in a sealed envelope for you to review at your convenience later.  YOU SHOULD EXPECT: Some feelings of bloating in the abdomen. Passage of more gas than usual.  Walking can help get rid of the air that was put into your GI tract during the procedure and reduce the bloating. If you had a lower endoscopy (such as a colonoscopy or flexible sigmoidoscopy) you may notice spotting of blood in your stool or on the toilet paper. If you underwent a bowel prep for your procedure, then you may not have a normal bowel movement for a few days.  DIET: Your first meal following the procedure should be a light meal and then it is ok to progress to your normal diet.  A half-sandwich or bowl of soup is an example of a good first meal.  Heavy or fried foods are harder to digest and may make you feel nauseous or bloated.  Likewise meals heavy in dairy and vegetables can cause extra gas to form and this can also increase the bloating.  Drink plenty of fluids but you should avoid alcoholic beverages for 24 hours.  ACTIVITY: Your care partner should take you home directly after the procedure.  You should plan to take it easy, moving slowly for the rest of the day.  You can resume normal activity the day after the procedure however you should NOT DRIVE or use heavy machinery for 24 hours (because of the sedation medicines used during the test).    SYMPTOMS TO REPORT IMMEDIATELY: A gastroenterologist can be reached at any hour.  During normal business hours, 8:30 AM to 5:00 PM Monday through Friday,  call [phone].  After hours and on weekends, please call the GI answering service at [phone] who will take a message and have the physician on call contact you.   Following lower endoscopy (colonoscopy or flexible sigmoidoscopy):  Excessive amounts of blood in the stool  Significant tenderness or worsening of abdominal pains  Swelling of the abdomen that is new, acute  Fever of 100F or higher  FOLLOW UP: Our staff will call the home number listed on your records the next business day following your procedure to check on you and address any questions or concerns that you may have at that time regarding the information given to you following your procedure. This is a courtesy call and so if there is no answer at the home number and we have not heard from you through the emergency physician on call, we will assume that you have returned to your regular daily activities without incident.  SIGNATURES/CONFIDENTIALITY: You and/or your care partner have signed paperwork which will be entered into your electronic medical record.  These signatures attest to the fact that that the information above on your After Visit Summary has been reviewed and is understood.  Full responsibility of the confidentiality of this discharge information lies with you and/or your care-partner.   Please follow high fiber diet- see handout  Repeat stool cards

## 2012-07-28 NOTE — Op Note (Addendum)
[facility] [address] [place], [place]  [address]  COLONOSCOPY PROCEDURE REPORT  PATIENT:  Adrienne [name], Adrienne [name]  MR#:  [id number] BIRTHDATE:  02-16-1941, 70 yrs. old  GENDER:  female ENDOSCOPIST:  Hedwig Morton. Juanda Chance, MD REF. BY:  Dr Kerby Nora PROCEDURE DATE:  07/28/2012 PROCEDURE:  Colonoscopy 62130 ASA CLASS:  Class II INDICATIONS:  heme positive stool f hx of U.C. in mother and daughter last colon 2008, and before that in 1997 MEDICATIONS:   MAC sedation, administered by CRNA, propofol (Diprivan) 80 mg  DESCRIPTION OF PROCEDURE:   After the risks and benefits and of the procedure were explained, informed consent was obtained. Digital rectal exam was performed and revealed no rectal masses. The LB [id number]-[id number] endoscope was introduced through the anus and advanced to the cecum, which was identified by both the appendix and ileocecal valve.  The quality of the prep was good, using MoviPrep.  The instrument was then slowly withdrawn as the colon was fully examined. <<PROCEDUREIMAGES>>  FINDINGS:  No polyps or cancers were seen (see image1, image2, and image3).   Retroflexed views in the rectum revealed no abnormalities.    The scope was then withdrawn from the patient and the procedure completed.  COMPLICATIONS:  None ENDOSCOPIC IMPRESSION: 1) No polyps or cancers 2) Normal colonoscopy nothing to account for the heme positive home test RECOMMENDATIONS: 1) High fiber diet. repeat stool cards  REPEAT EXAM:  In 10 year(s) for.  ______________________________ Hedwig Morton. Juanda Chance, MD  CC:  n. REVISED:  07/28/2012 10:10 AM eSIGNED:   Hedwig Morton. Mishon Blubaugh at 07/28/2012 10:10 AM  Orion Crook, [phone]

## 2012-07-28 NOTE — Progress Notes (Signed)
Patient did not experience any of the following events: a burn prior to discharge; a fall within the facility; wrong site/side/patient/procedure/implant event; or a hospital transfer or hospital admission upon discharge from the facility. (G8907) Patient did not have preoperative order for IV antibiotic SSI prophylaxis. (G8918)  

## 2012-07-29 ENCOUNTER — Telehealth: Payer: Self-pay

## 2012-07-29 NOTE — Telephone Encounter (Signed)
Unable to leave message no number.

## 2012-07-30 ENCOUNTER — Telehealth: Payer: Self-pay | Admitting: *Deleted

## 2012-07-30 DIAGNOSIS — K921 Melena: Secondary | ICD-10-CM

## 2012-07-30 NOTE — Telephone Encounter (Signed)
Called patient to verify she received stool cards to repeat. Left a message for patient to call me.

## 2012-08-02 NOTE — Telephone Encounter (Signed)
Left a message for patient to call me. 

## 2012-08-03 NOTE — Telephone Encounter (Signed)
Left a message for patient to call me. 

## 2012-08-03 NOTE — Telephone Encounter (Signed)
Spoke with patient and she has her stool cards to repeat.

## 2012-08-05 ENCOUNTER — Encounter (INDEPENDENT_AMBULATORY_CARE_PROVIDER_SITE_OTHER): Payer: Self-pay | Admitting: Surgery

## 2012-08-16 ENCOUNTER — Other Ambulatory Visit (INDEPENDENT_AMBULATORY_CARE_PROVIDER_SITE_OTHER): Payer: Medicare PPO

## 2012-08-16 DIAGNOSIS — K921 Melena: Secondary | ICD-10-CM

## 2012-08-16 LAB — HEMOCCULT SLIDES (X 3 CARDS)
Fecal Occult Blood: NEGATIVE
OCCULT 1: NEGATIVE
OCCULT 5: NEGATIVE

## 2012-08-23 ENCOUNTER — Other Ambulatory Visit: Payer: Self-pay | Admitting: Family Medicine

## 2013-03-08 ENCOUNTER — Other Ambulatory Visit (HOSPITAL_BASED_OUTPATIENT_CLINIC_OR_DEPARTMENT_OTHER): Payer: Medicare PPO | Admitting: Lab

## 2013-03-08 ENCOUNTER — Ambulatory Visit (HOSPITAL_BASED_OUTPATIENT_CLINIC_OR_DEPARTMENT_OTHER): Payer: Medicare PPO | Admitting: Oncology

## 2013-03-08 VITALS — BP 131/64 | HR 77 | Temp 98.2°F | Resp 20 | Ht 61.0 in | Wt 148.8 lb

## 2013-03-08 DIAGNOSIS — D649 Anemia, unspecified: Secondary | ICD-10-CM

## 2013-03-08 DIAGNOSIS — D0510 Intraductal carcinoma in situ of unspecified breast: Secondary | ICD-10-CM

## 2013-03-08 DIAGNOSIS — Z170 Estrogen receptor positive status [ER+]: Secondary | ICD-10-CM

## 2013-03-08 DIAGNOSIS — C50919 Malignant neoplasm of unspecified site of unspecified female breast: Secondary | ICD-10-CM

## 2013-03-08 DIAGNOSIS — M899 Disorder of bone, unspecified: Secondary | ICD-10-CM

## 2013-03-08 DIAGNOSIS — D0590 Unspecified type of carcinoma in situ of unspecified breast: Secondary | ICD-10-CM

## 2013-03-08 LAB — CBC WITH DIFFERENTIAL/PLATELET
Basophils Absolute: 0 10*3/uL (ref 0.0–0.1)
Eosinophils Absolute: 0.2 10*3/uL (ref 0.0–0.5)
HGB: 10.9 g/dL — ABNORMAL LOW (ref 11.6–15.9)
MCV: 89.2 fL (ref 79.5–101.0)
MONO#: 0.3 10*3/uL (ref 0.1–0.9)
NEUT#: 1.9 10*3/uL (ref 1.5–6.5)
RDW: 13.3 % (ref 11.2–14.5)
WBC: 3.9 10*3/uL (ref 3.9–10.3)
lymph#: 1.5 10*3/uL (ref 0.9–3.3)

## 2013-03-08 LAB — COMPREHENSIVE METABOLIC PANEL (CC13)
AST: 25 U/L (ref 5–34)
Albumin: 3.7 g/dL (ref 3.5–5.0)
Alkaline Phosphatase: 65 U/L (ref 40–150)
Potassium: 4.2 mEq/L (ref 3.5–5.1)
Sodium: 140 mEq/L (ref 136–145)
Total Bilirubin: 0.32 mg/dL (ref 0.20–1.20)
Total Protein: 7 g/dL (ref 6.4–8.3)

## 2013-03-08 LAB — MORPHOLOGY
PLT EST: ADEQUATE
RBC Comments: NORMAL

## 2013-03-08 NOTE — Progress Notes (Signed)
Hematology and Oncology Follow Up Visit  Adrienne [name] [id number] 28-Sep-1941 72 y.o. 03/08/2013 4:03 PM   Principle Diagnosis: Encounter Diagnoses  Name Primary?  . ANEMIA, NORMOCYTIC   . MALIGNANT NEOPLASM OF BREAST UNSPECIFIED SITE Yes  . 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 She underwent lumpectomy followed by radiation. Tumor was 0.4 cm. ER/PR negative. She has had no evidence for new disease. Most recent mammograms were done on July 02, 2012.   She is also followed here for chronic normochromic anemia which has been stable over many years.  She has had no interim medical problems. She had a routine colonoscopy last year which was unremarkable. She denies any headache, change in vision, bone pain other than chronic back pain related to spinal stenosis, vaginal bleeding, change in bowel habit.  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 She is really proud of her grandson who lives in the [place] area and she is now getting more time to visit.   Medications: reviewed  Allergies:  Allergies  Allergen Reactions  . Ivp Dye (Iodinated Diagnostic Agents)     CARDIAC ARREST  . Metoclopramide Hcl   . Penicillins     REACTION: Anaphylactic Shock  . Metoclopramide Hcl Anxiety    REACTION: Rash    Review of Systems: Constitutional:   No constitutional symptoms Respiratory: No cough or dyspnea Cardiovascular:  No chest pain or palpitations Gastrointestinal: See above Genito-Urinary: See above Musculoskeletal: See above Neurologic: See above Skin: No rash Remaining ROS negative.  Physical Exam: Blood pressure 131/64, pulse 77, temperature 98.2 F (36.8 C), temperature source Oral, resp. rate 20, height 5\' 1"  (1.549 m), weight 148 lb 12.8 oz (67.495 kg). Wt Readings from Last 3  Encounters:  03/08/13 148 lb 12.8 oz (67.495 kg)  07/28/12 145 lb (65.772 kg)  07/21/12 145 lb 3.2 oz (65.862 kg)     General appearance: Well-nourished Caucasian woman HENNT: Pharynx no erythema or exudate Lymph nodes: No cervical, supraclavicular, or axillary adenopathy Breasts: Surgical changes left breast, chronic inversion of left nipple, no dominant mass in either breast Lungs: Clear to auscultation resonant to percussion Heart: Regular rhythm no murmur Abdomen: Soft, nontender, no mass, no organomegaly Extremities: No edema, no calf tenderness Vascular: No cyanosis Neurologic: Motor strength 5 over 5, reflexes absent symmetric at the knees, 1+ symmetric at the biceps Skin: No rash or ecchymosis  Lab Results: Lab Results  Component Value Date   WBC 3.9 03/08/2013   HGB 10.9* 03/08/2013   HCT 34.0* 03/08/2013   MCV 89.2 03/08/2013   PLT 232 03/08/2013     Chemistry      Component Value Date/Time   NA 140 03/08/2013 1203   NA 138 01/06/2012 1337   NA 141 11/05/2009 0918   K 4.2 03/08/2013 1203   K 4.7 01/06/2012 1337   K 4.4 11/05/2009 0918   CL 103 03/08/2013 1203   CL 101 01/06/2012 1337   CL 98 11/05/2009 0918   CO2 28 03/08/2013 1203   CO2 27 01/06/2012 1337   CO2 30 11/05/2009 0918   BUN 15.7 03/08/2013 1203   BUN 12 01/06/2012 1337   BUN 15 11/05/2009 0918   CREATININE 0.7 03/08/2013 1203   CREATININE 0.77 01/06/2012 1337   CREATININE 0.6 11/05/2009 1191  Component Value Date/Time   CALCIUM 9.5 03/08/2013 1203   CALCIUM 9.5 01/06/2012 1337   CALCIUM 9.3 11/05/2009 0918   ALKPHOS 65 03/08/2013 1203   ALKPHOS 52 01/06/2012 1337   AST 25 03/08/2013 1203   AST 24 01/06/2012 1337   ALT 17 03/08/2013 1203   ALT 16 01/06/2012 1337   BILITOT 0.32 03/08/2013 1203   BILITOT 0.3 01/06/2012 1337       Radiological Studies: Most recent mammogram done July 2013 showed no new disease   Impression and Plan: #1. DCIS left breast treated with surgery and radiation she remains free of any obvious  new disease now out almost 5 years from diagnosis in July 2009. Plan continue annual exam and mammograms.   #2. Chronic normochromic anemia. Hemoglobin remains stable.   #3. History of macular degeneration.   #4. History of sciatica/spinal stenosis symptoms partially controlled with gabapentin .  #5. Remote nephrolithiasis     CC:. Dr. Pricilla Holm; Dr. Harriette Bouillon; Dr. Dorothy Puffer   Levert Feinstein, MD 3/11/20144:03 PM

## 2013-03-10 ENCOUNTER — Telehealth: Payer: Self-pay | Admitting: Oncology

## 2013-03-10 NOTE — Telephone Encounter (Signed)
Called  pt left message , appt for lab and Md March 2015

## 2013-04-26 ENCOUNTER — Other Ambulatory Visit: Payer: Self-pay | Admitting: *Deleted

## 2013-04-26 MED ORDER — GABAPENTIN 300 MG PO CAPS: ORAL_CAPSULE | ORAL | Status: DC

## 2013-05-18 ENCOUNTER — Encounter: Payer: Self-pay | Admitting: Family Medicine

## 2013-05-18 ENCOUNTER — Ambulatory Visit (INDEPENDENT_AMBULATORY_CARE_PROVIDER_SITE_OTHER): Payer: Medicare PPO | Admitting: Family Medicine

## 2013-05-18 VITALS — BP 120/60 | HR 102 | Temp 99.0°F | Ht 61.0 in | Wt 148.5 lb

## 2013-05-18 DIAGNOSIS — M79673 Pain in unspecified foot: Secondary | ICD-10-CM

## 2013-05-18 DIAGNOSIS — M79609 Pain in unspecified limb: Secondary | ICD-10-CM

## 2013-05-18 MED ORDER — UREA 40 % EX CREA: TOPICAL_CREAM | CUTANEOUS | Status: DC

## 2013-05-18 NOTE — Progress Notes (Signed)
Nature conservation officer at [facility] [address] [place] [address] Phone: [phone] Fax: [phone]  Date:  05/18/2013   Name:  Adrienne [name]   DOB:  19-Jul-1941   MRN:  [id number] Gender: female Age: 72 y.o.  Primary Physician:  Kerby Nora, MD  Evaluating MD: Hannah Beat, MD   Chief Complaint: Foot Pain   History of Present Illness:  Adrienne [name] is a 72 y.o. pleasant patient who presents with the following:  Going down some steps in [place], and hit the back of her heel. Then it got bruised and it has been bleeding. Has been 3 weeks and she is worried about it. Hurt a lot, then started to bleed.  Patient Active Problem List   Diagnosis Date Noted  . Skin rash 06/29/2012  . OSTEOPENIA 12/22/2009  . ANEMIA, NORMOCYTIC 11/14/2009  . MALIGNANT NEOPLASM OF BREAST UNSPECIFIED SITE 07/25/2008  . LOW BACK PAIN, CHRONIC 04/12/2008  . GENITAL HERPES 11/16/2007  . DEPRESSION 10/08/2007  . SPINAL STENOSIS 10/08/2007    Past Medical History  Diagnosis Date  . Lumbago   . Genital herpes, unspecified   . Depressive disorder, not elsewhere classified   . Spinal stenosis, unspecified region other than cervical   . Anemia, unspecified   . Breast cancer   . Disorder of bone and cartilage, unspecified   . Macular degeneration   . Bronchitis   . External hemorrhoids   . IBS (irritable bowel syndrome)   . Depression   . Bipolar affective disorder     Past Surgical History  Procedure Laterality Date  . Abdominal hysterectomy      Total but cervix left  . Breast lumpectomy  6/09    Left; high grade carcinoma in situ  . Rectocele repair    . Mri lumbar  05/2007    L4-L5 spinal stenosis  . Bilateral tuboplasty    . Tonsillectomy    . Dilation and curettage of uterus      x 2    History   Social History  . Marital Status: Divorced    Spouse Name: N/A    Number of Children: 1  . Years of Education: N/A   Occupational History  . Med tech    Retired  .     Social History Main Topics  . Smoking status: Never Smoker   . Smokeless tobacco: Never Used  . Alcohol Use: 0.0 oz/week    10-14 Glasses of wine per week     Comment: 1 cocktail and 1 glass of wine daily  . Drug Use: No  . Sexually Active: Not on file   Other Topics Concern  . 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             Family History  Problem Relation Age of Onset  . Pancreatic cancer Father   . Prostate cancer Father   . Alzheimer's disease Father   . Heart attack Father   . Ulcerative colitis Mother   . Breast cancer Mother   . Hypertension Brother   . Hyperlipidemia Brother   . Ulcerative colitis Daughter     Allergies  Allergen Reactions  . Ivp Dye (Iodinated Diagnostic Agents)     CARDIAC ARREST  . Metoclopramide Hcl   .  Penicillins     REACTION: Anaphylactic Shock  . Metoclopramide Hcl Anxiety    REACTION: Rash    Medication list has been reviewed and updated.  Outpatient Prescriptions Prior to Visit  Medication Sig Dispense Refill  . aspirin 81 MG EC tablet Take 81 mg by mouth daily.        . Calcium Carbonate (CALCIUM 600 PO) Take 600 mg by mouth 2 (two) times daily.        . Cholecalciferol (VITAMIN D) 1000 UNITS capsule Take 1,000 Units by mouth daily.        . fish oil-omega-3 fatty acids 1000 MG capsule Take 1 g by mouth daily.      Marland Kitchen gabapentin (NEURONTIN) 300 MG capsule TAKE 2 CAPSULES TWICE DAILY  240 capsule  3  . Garlic 400 MG TABS Take 1 tablet by mouth daily.        Marland Kitchen loperamide (IMODIUM) 1 MG/5ML solution Take 1 mg by mouth daily.        Marland Kitchen LORazepam (ATIVAN) 1 MG tablet Take 0.5 mg by mouth at bedtime as needed.       . Multiple Vitamin (MULTIVITAMIN) capsule Take 1 capsule by mouth daily.        . Multiple Vitamins-Minerals (OCUVITE PO) Take 1 tablet by  mouth 2 (two) times daily.        . phenelzine (NARDIL) 15 MG tablet Take 15 mg by mouth 3 (three) times daily.        Marland Kitchen MOVIPREP 100 G SOLR Use per prep instructions  1 kit  0   Facility-Administered Medications Prior to Visit  Medication Dose Route Frequency Provider Last Rate Last Dose  . 0.9 %  sodium chloride infusion  500 mL Intravenous Continuous Hart Carwin, MD        Review of Systems:   GEN: No acute illnesses, no fevers, chills. GI: No n/v/d, eating normally Pulm: No SOB Interactive and getting along well at home.  Otherwise, ROS is as per the HPI.   Physical Examination: BP 120/60  Pulse 102  Temp(Src) 99 F (37.2 C) (Oral)  Ht 5\' 1"  (1.549 m)  Wt 148 lb 8 oz (67.359 kg)  BMI 28.07 kg/m2  SpO2 95%  Ideal Body Weight: Weight in (lb) to have BMI = 25: 132   GEN: WDWN, NAD, Non-toxic, Alert & Oriented x 3 HEENT: Atraumatic, Normocephalic.  Ears and Nose: No external deformity. EXTR: No clubbing/cyanosis/edema NEURO: Normal gait.  PSYCH: Normally interactive. Conversant. Not depressed or anxious appearing.  Calm demeanor.   R heel, cracked, deep fissure in callous.  L callous less Calcaneous nt to deep palpation  Assessment and Plan: Heel pain, unspecified laterality   Urea and pumice stone, reassured  Meds ordered this encounter  Medications  . urea (CARMOL) 40 % CREA    Sig: Apply bid    Dispense:  198.6 each    Refill:  0     Signed, Rebecca Motta T. Sabas Frett, MD 05/18/2013 12:00 PM

## 2013-05-19 ENCOUNTER — Telehealth: Payer: Self-pay

## 2013-05-19 NOTE — Telephone Encounter (Signed)
Pt wanted Dr Patsy Lager to know cost of Urea cream was $ 100.00 and pt did not get Urea.  Pt is trying Eucerin cream that has Urea in it and if pt's problem does not clear pt will call back.

## 2013-05-20 NOTE — Telephone Encounter (Signed)
That is reasonable.

## 2013-06-06 ENCOUNTER — Telehealth: Payer: Self-pay | Admitting: Oncology

## 2013-06-06 NOTE — Telephone Encounter (Signed)
Faxed pt medical records to [facility] °

## 2013-06-07 ENCOUNTER — Other Ambulatory Visit: Payer: Self-pay | Admitting: Oncology

## 2013-06-07 DIAGNOSIS — Z853 Personal history of malignant neoplasm of breast: Secondary | ICD-10-CM

## 2013-07-04 ENCOUNTER — Ambulatory Visit
Admission: RE | Admit: 2013-07-04 | Discharge: 2013-07-04 | Disposition: A | Discharge status: Home / Self Care | Payer: Self-pay | Source: Ambulatory Visit | Attending: Oncology | Admitting: Oncology

## 2013-07-04 DIAGNOSIS — Z853 Personal history of malignant neoplasm of breast: Secondary | ICD-10-CM

## 2013-07-15 ENCOUNTER — Ambulatory Visit (INDEPENDENT_AMBULATORY_CARE_PROVIDER_SITE_OTHER): Payer: Medicare PPO | Admitting: Family Medicine

## 2013-07-15 ENCOUNTER — Encounter: Payer: Self-pay | Admitting: Family Medicine

## 2013-07-15 VITALS — BP 120/70 | HR 86 | Temp 98.5°F | Ht 61.0 in | Wt 149.8 lb

## 2013-07-15 DIAGNOSIS — G8929 Other chronic pain: Secondary | ICD-10-CM

## 2013-07-15 DIAGNOSIS — Z13220 Encounter for screening for lipoid disorders: Secondary | ICD-10-CM

## 2013-07-15 DIAGNOSIS — Z0000 Encounter for general adult medical examination without abnormal findings: Secondary | ICD-10-CM

## 2013-07-15 DIAGNOSIS — M549 Dorsalgia, unspecified: Secondary | ICD-10-CM

## 2013-07-15 MED ORDER — GABAPENTIN 300 MG PO CAPS: ORAL_CAPSULE | ORAL | Status: DC

## 2013-07-15 NOTE — Patient Instructions (Addendum)
Stop at front desk to schedule fasting labs when you can.

## 2013-07-15 NOTE — Addendum Note (Signed)
Addended by: Consuello Masse on: 07/15/2013 10:57 AM   Modules accepted: Orders

## 2013-07-15 NOTE — Progress Notes (Signed)
HPI  I have personally reviewed the Medicare Annual Wellness questionnaire and have noted  1. The patient's medical and social history  2. Their use of alcohol, tobacco or illicit drugs  3. Their current medications and supplements  4. The patient's functional ability including ADL's, fall risks, home safety risks and hearing or visual  impairment.  5. Diet and physical activities  6. Evidence for depression or mood disorders  The patients weight, height, BMI and visual acuity have been recorded in the chart  I have made referrals, counseling and provided education to the patient based review of the above and I have provided the pt with a written personalized care plan for preventive services.   Depression: Stable on Nardil. Using ativan prn.   Chronic back pain/spinal stenosis: not ideal control with neurontin and exercise.  Worst in AM when wakes up.  No falls, no new injuries. 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 .   Due for lab re-eval. Lab Results  Component Value Date   CHOL 230* 07/12/2012   HDL 77.30 07/12/2012   LDLCALC 113 11/07/2010   LDLDIRECT 130.0 07/12/2012   TRIG 92.0 07/12/2012   CHOLHDL 3 07/12/2012    Exercise: walks, stairs.  Diet: moderate.   Review of Systems  Constitutional: Negative for fever, fatigue and unexpected weight change.  HENT: Negative for ear pain, congestion, sore throat, sneezing, trouble swallowing and sinus pressure.  Eyes: Negative for pain and itching.  Respiratory: Negative for cough, shortness of breath and wheezing.  Cardiovascular: Negative for chest pain, palpitations and leg swelling.  Gastrointestinal: Negative for nausea, abdominal pain, diarrhea, constipation and blood in stool.  Genitourinary: Negative for dysuria, hematuria, vaginal discharge and difficulty urinating.  Musculoskeletal: Positive for back pain.  Skin: Negative for rash.  Neurological: Negative for syncope, weakness, light-headedness, numbness and  headaches.  Psychiatric/Behavioral: Negative for confusion and dysphoric mood. The patient is not nervous/anxious.  Objective:   Physical Exam  Constitutional: Vital signs are normal. She appears well-developed and well-nourished. She is cooperative. Non-toxic appearance. She does not appear ill. No distress.  HENT:  Head: Normocephalic.  Right Ear: Hearing, tympanic membrane, external ear and ear canal normal.  Left Ear: Hearing, tympanic membrane, external ear and ear canal normal.  Nose: Nose normal.  Eyes: Conjunctivae, EOM and lids are normal. Pupils are equal, round, and reactive to light. No foreign bodies found.  Neck: Trachea normal and normal range of motion. Neck supple. Carotid bruit is not present. No mass and no thyromegaly present.  Cardiovascular: Normal rate, regular rhythm, S1 normal, S2 normal, normal heart sounds and intact distal pulses. Exam reveals no gallop.  No murmur heard.  Pulmonary/Chest: Effort normal and breath sounds normal. No respiratory distress. She has no wheezes. She has no rhonchi. She has no rales.  Abdominal: Soft. Normal appearance and bowel sounds are normal. She exhibits no distension, no fluid wave, no abdominal bruit and no mass. There is no hepatosplenomegaly. There is no tenderness. There is no rebound, no guarding and no CVA tenderness. No hernia.  Genitourinary: No breast swelling, tenderness, discharge or bleeding. Pelvic exam was performed with patient prone.  Lymphadenopathy:  She has no cervical adenopathy.  She has no axillary adenopathy.  Neurological: She is alert. She has normal strength. No cranial nerve deficit or sensory deficit.  Skin: Skin is warm, dry and intact. No rash noted.  Psychiatric: Her speech is normal and behavior is normal. Judgment normal. Her mood appears not anxious.  Cognition and memory are normal. She does not exhibit a depressed mood.  Assessment & Plan:   The patient's preventative maintenance and recommended  screening tests for an annual wellness exam were reviewed in full today.  Brought up to date unless services declined.  Counselled on the importance of diet, exercise, and its role in overall health and mortality.  The patient's FH and SH was reviewed, including their home life, tobacco status, and drug and alcohol status.   Vaccines: Uptodate with PNA, Td and shingles  Colon: Nml 07/28/2012 Dr. Juanda Chance, repeat in 10 years, has family history of ulcerative coltitis.  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 Cyndie Chime, had mammogram  06/2013 nml DEXA: normal 05/2012 on fosamax, stopped med at that time since on for >5 years. Plan recheck in 2015. Nonsmoker  Total hysterecomy, no pap indicated even though cervix, DVE.

## 2013-07-27 ENCOUNTER — Other Ambulatory Visit (INDEPENDENT_AMBULATORY_CARE_PROVIDER_SITE_OTHER): Payer: Medicare PPO

## 2013-07-27 DIAGNOSIS — Z13220 Encounter for screening for lipoid disorders: Secondary | ICD-10-CM

## 2013-07-27 DIAGNOSIS — Z136 Encounter for screening for cardiovascular disorders: Secondary | ICD-10-CM

## 2013-07-27 LAB — COMPREHENSIVE METABOLIC PANEL
AST: 22 U/L (ref 0–37)
Albumin: 3.8 g/dL (ref 3.5–5.2)
Alkaline Phosphatase: 54 U/L (ref 39–117)
BUN: 16 mg/dL (ref 6–23)
Calcium: 9.5 mg/dL (ref 8.4–10.5)
Creatinine, Ser: 0.7 mg/dL (ref 0.4–1.2)
Glucose, Bld: 99 mg/dL (ref 70–99)

## 2013-07-27 LAB — LIPID PANEL
Cholesterol: 232 mg/dL — ABNORMAL HIGH (ref 0–200)
HDL: 74.8 mg/dL (ref >39.00)
Triglycerides: 114 mg/dL (ref 0.0–149.0)

## 2013-07-27 LAB — LDL CHOLESTEROL, DIRECT: Direct LDL: 135.9 mg/dL

## 2013-07-28 ENCOUNTER — Encounter: Payer: Self-pay | Admitting: *Deleted

## 2013-11-21 ENCOUNTER — Other Ambulatory Visit: Payer: Self-pay | Admitting: Family Medicine

## 2013-11-21 NOTE — Telephone Encounter (Signed)
Pt requesting hard copy RX to be faxed to [facility] at [phone].

## 2013-11-22 MED ORDER — GABAPENTIN 300 MG PO CAPS: ORAL_CAPSULE | ORAL | Status: DC

## 2013-11-22 NOTE — Telephone Encounter (Signed)
Prescription faxed to [phone].

## 2013-11-22 NOTE — Addendum Note (Signed)
Addended by: Damita Lack on: 11/22/2013 09:37 AM   Modules accepted: Orders

## 2013-11-22 NOTE — Telephone Encounter (Signed)
Okay to refill as requested.

## 2014-01-09 ENCOUNTER — Telehealth: Payer: Self-pay

## 2014-01-09 NOTE — Telephone Encounter (Signed)
Pt left v/m has kidney stone for 1 week, last week pt voiding bright red blood, pt having lower back crampy pain on and off now. No fever, no N&V, no abd pain and no pain upon urination or frequency and urine has gone back to yellow color.  pt is retired Estate manager/land agent and does not want to come in for appt. Pt wants to know if there is any med that could be ordered to dissolve stone. [place]. Pt has increased liquids and drinking lemon water. Pt saw urologist years ago but cannot remember doctors name. Pt request cb.

## 2014-01-10 NOTE — Telephone Encounter (Signed)
Adrienne [name] notified as instructed by telephone.   She states she knows she doesn't have an infection.  States this will be the third kidney stone that she has had so she knows those symptoms vs. infection symptoms  States the previous stones have been so minute that she can't even catch them in a strainer.  States she will just wait it out and sees what happens.  If she decides she wants to try the flomax, she will call us back by Friday.

## 2014-01-10 NOTE — Telephone Encounter (Signed)
Noted. I still recommend an appt to eval, but pt refuses.

## 2014-01-10 NOTE — Telephone Encounter (Signed)
No med can dissolve stone, but she could start flomax to help pass stone.  The size of stone determines is she will pass it. No pain med can be prescribed over the phone and I do recommend her being seen to make sure no infection is being missed.

## 2014-01-12 ENCOUNTER — Encounter: Payer: Self-pay | Admitting: Family Medicine

## 2014-01-12 ENCOUNTER — Telehealth: Payer: Self-pay

## 2014-01-12 ENCOUNTER — Ambulatory Visit (INDEPENDENT_AMBULATORY_CARE_PROVIDER_SITE_OTHER): Payer: Medicare PPO | Admitting: Family Medicine

## 2014-01-12 VITALS — BP 138/68 | HR 96 | Temp 98.5°F | Wt 143.0 lb

## 2014-01-12 DIAGNOSIS — J069 Acute upper respiratory infection, unspecified: Secondary | ICD-10-CM

## 2014-01-12 DIAGNOSIS — B9789 Other viral agents as the cause of diseases classified elsewhere: Secondary | ICD-10-CM

## 2014-01-12 DIAGNOSIS — N200 Calculus of kidney: Secondary | ICD-10-CM

## 2014-01-12 NOTE — Assessment & Plan Note (Signed)
Pt refused UA to eval or imaging. She states this is very typical for her. Encouraged her to eval further if not resolving/passed stone in next few weeks as expected

## 2014-01-12 NOTE — Telephone Encounter (Signed)
Agreed pt need to be seen.

## 2014-01-12 NOTE — Progress Notes (Signed)
   Subjective:    Patient ID: Adrienne [name], female    DOB: [birth year], 73 y.o.   MRN: [id number]  HPI  73 year old female with hx of very small kidney stones presents with new onset dry cough, nasal congestion, no face pain, no ear pain x 5 days. Mild sore throat yesterday. Cough is keeping her up at night.  Hoarseness. Fatigued, no myalgia. Low grade temp 99.5 F. No SOB, wheeze. Tylenol.   No known exposure.  She has also been having some low back crampy pain she associates with kidney stones. She has noted blood in urine x 2 daysas she associates with her nml stone, no dysuria.  She is no concerned about this at this time.   Non smoker.  Review of Systems  Constitutional: Negative for fever and fatigue.  HENT: Negative for ear pain.   Eyes: Negative for pain.  Respiratory: Negative for chest tightness and shortness of breath.   Cardiovascular: Negative for chest pain, palpitations and leg swelling.  Gastrointestinal: Negative for abdominal pain.  Genitourinary: Negative for dysuria.       Objective:   Physical Exam  Constitutional: Vital signs are normal. She appears well-developed and well-nourished. She is cooperative.  Non-toxic appearance. She does not appear ill. No distress.  HENT:  Head: Normocephalic.  Right Ear: Hearing, tympanic membrane, external ear and ear canal normal. Tympanic membrane is not erythematous, not retracted and not bulging.  Left Ear: Hearing, tympanic membrane, external ear and ear canal normal. Tympanic membrane is not erythematous, not retracted and not bulging.  Nose: Mucosal edema and rhinorrhea present. Right sinus exhibits no maxillary sinus tenderness and no frontal sinus tenderness. Left sinus exhibits no maxillary sinus tenderness and no frontal sinus tenderness.  Mouth/Throat: Uvula is midline, oropharynx is clear and moist and mucous membranes are normal.  Eyes: Conjunctivae, EOM and lids are normal. Pupils are equal, round, and  reactive to light. Lids are everted and swept, no foreign bodies found.  Neck: Trachea normal and normal range of motion. Neck supple. Carotid bruit is not present. No mass and no thyromegaly present.  Cardiovascular: Normal rate, regular rhythm, S1 normal, S2 normal, normal heart sounds, intact distal pulses and normal pulses.  Exam reveals no gallop and no friction rub.   No murmur heard. Pulmonary/Chest: Effort normal and breath sounds normal. Not tachypneic. No respiratory distress. She has no decreased breath sounds. She has no wheezes. She has no rhonchi. She has no rales.  Abdominal: There is no hepatosplenomegaly. There is no tenderness. There is no CVA tenderness.  Neurological: She is alert.  Skin: Skin is warm, dry and intact. No rash noted.  Psychiatric: Her speech is normal and behavior is normal. Judgment normal. Her mood appears not anxious. Cognition and memory are normal. She does not exhibit a depressed mood.          Assessment & Plan:

## 2014-01-12 NOTE — Telephone Encounter (Signed)
Patient advised.

## 2014-01-12 NOTE — Telephone Encounter (Signed)
Pt now has developed fever 99-100, pt request tamiflu; advised pt needs to be seen. Pt scheduled appt today 12;15 with Dr Diona Browner.

## 2014-01-12 NOTE — Patient Instructions (Signed)
If stone issues are not continuing to improve.. Let me know for further eval/. Mucinex DM twice daily.. Nasal saline spray  2-3 times a day Call if not improving in 5-7 days as expected.

## 2014-01-12 NOTE — Telephone Encounter (Signed)
Noted reaction.. Have pt take mucinex alone without the DM component.. This should be fine.  If cough at night severe we can try another antitussive med prescription.

## 2014-01-12 NOTE — Telephone Encounter (Signed)
Anna pharmacist [facility] left v/m; pt was seen today and was advised to take Mucinex DM which has possible severe drug interaction with Nardil. Vicente Males request pt to be called at [phone] to clarify if pt should take Mucinex DM with Nardil.

## 2014-01-12 NOTE — Assessment & Plan Note (Addendum)
Not typical of flu. No testing needed. Symptomatic care.

## 2014-02-10 ENCOUNTER — Encounter: Payer: Self-pay | Admitting: Family Medicine

## 2014-02-10 ENCOUNTER — Ambulatory Visit (INDEPENDENT_AMBULATORY_CARE_PROVIDER_SITE_OTHER): Payer: Medicare PPO | Admitting: Family Medicine

## 2014-02-10 ENCOUNTER — Ambulatory Visit (INDEPENDENT_AMBULATORY_CARE_PROVIDER_SITE_OTHER)
Admission: RE | Admit: 2014-02-10 | Discharge: 2014-02-10 | Disposition: A | Discharge status: Home / Self Care | Payer: Medicare PPO | Source: Ambulatory Visit | Attending: Family Medicine | Admitting: Family Medicine

## 2014-02-10 VITALS — BP 114/60 | HR 84 | Temp 98.3°F | Ht 61.0 in | Wt 147.8 lb

## 2014-02-10 DIAGNOSIS — R109 Unspecified abdominal pain: Secondary | ICD-10-CM

## 2014-02-10 DIAGNOSIS — N200 Calculus of kidney: Secondary | ICD-10-CM

## 2014-02-10 DIAGNOSIS — R3129 Other microscopic hematuria: Secondary | ICD-10-CM | POA: Insufficient documentation

## 2014-02-10 LAB — POCT URINALYSIS DIPSTICK
Bilirubin, UA: NEGATIVE
Glucose, UA: NEGATIVE
KETONES UA: NEGATIVE
LEUKOCYTES UA: NEGATIVE
Nitrite, UA: NEGATIVE
Spec Grav, UA: 1.015
UROBILINOGEN UA: 0.2
pH, UA: 6.5

## 2014-02-10 MED ORDER — TAMSULOSIN HCL 0.4 MG PO CAPS: 0.4000 mg | ORAL_CAPSULE | Freq: Every day | ORAL | Status: DC

## 2014-02-10 NOTE — Patient Instructions (Signed)
Start flomax, push fluids.  I will call with results of x-ray and for need of CT of abdomen.  We will also call with urine culture results. Go to ER if severe pain.

## 2014-02-10 NOTE — Progress Notes (Signed)
Pre-visit discussion using our clinic review tool. No additional management support is needed unless otherwise documented below in the visit note.  

## 2014-02-10 NOTE — Progress Notes (Signed)
   Subjective:    Patient ID: Adrienne [name], female    DOB: Jul 25, 1941, 73 y.o.   MRN: [id number]  HPI  73 year old female with history of nephrolithiasis presents to clinic today with intermittent pain in left flank/CVA over the last 2 months. She describes it as menstrual cramps. Pain 5/10 on pain scale. She has seen blood in urine in 11/2013, but none since.  She has been trying to increase fluids. No dysuria. No fever. Pain with bending over. Fatigue lately.      Review of Systems  Constitutional: Positive for fatigue. Negative for fever.  HENT: Negative for ear pain.   Eyes: Negative for pain.  Respiratory: Negative for cough and shortness of breath.   Cardiovascular: Negative for chest pain and leg swelling.  Gastrointestinal: Negative for abdominal pain.       Objective:   Physical Exam  Constitutional: Vital signs are normal. She appears well-developed and well-nourished. She is cooperative.  Non-toxic appearance. She does not appear ill. No distress.  HENT:  Head: Normocephalic.  Right Ear: Hearing, tympanic membrane, external ear and ear canal normal. Tympanic membrane is not erythematous, not retracted and not bulging.  Left Ear: Hearing, tympanic membrane, external ear and ear canal normal. Tympanic membrane is not erythematous, not retracted and not bulging.  Nose: No mucosal edema or rhinorrhea. Right sinus exhibits no maxillary sinus tenderness and no frontal sinus tenderness. Left sinus exhibits no maxillary sinus tenderness and no frontal sinus tenderness.  Mouth/Throat: Uvula is midline, oropharynx is clear and moist and mucous membranes are normal.  Eyes: Conjunctivae, EOM and lids are normal. Pupils are equal, round, and reactive to light. Lids are everted and swept, no foreign bodies found.  Neck: Trachea normal and normal range of motion. Neck supple. Carotid bruit is not present. No mass and no thyromegaly present.  Cardiovascular: Normal rate, regular  rhythm, S1 normal, S2 normal, normal heart sounds, intact distal pulses and normal pulses.  Exam reveals no gallop and no friction rub.   No murmur heard. Pulmonary/Chest: Effort normal and breath sounds normal. Not tachypneic. No respiratory distress. She has no decreased breath sounds. She has no wheezes. She has no rhonchi. She has no rales.  Abdominal: Soft. Normal appearance and bowel sounds are normal. There is no hepatosplenomegaly. There is no tenderness. There is no rebound and no CVA tenderness.  ttp is slightly below CVA and radiates some over to left flank  neg SLR  Musculoskeletal:       Thoracic back: Normal.       Lumbar back: She exhibits tenderness. She exhibits normal range of motion, no bony tenderness and no spasm.  Neurological: She is alert.  Skin: Skin is warm, dry and intact. No rash noted.  Psychiatric: Her speech is normal and behavior is normal. Judgment and thought content normal. Her mood appears not anxious. Cognition and memory are normal. She does not exhibit a depressed mood.          Assessment & Plan:

## 2014-02-10 NOTE — Assessment & Plan Note (Signed)
Pain may be musculoskeletal strain, but she doe shave hematuria suggesting stone.  Will eval with KUB, if negative send for CT renal protocol. Start flomax, strain urine and push fluids.

## 2014-02-11 LAB — URINE CULTURE
COLONY COUNT: NO GROWTH
Organism ID, Bacteria: NO GROWTH

## 2014-02-20 ENCOUNTER — Telehealth: Payer: Self-pay | Admitting: Family Medicine

## 2014-02-20 DIAGNOSIS — R3129 Other microscopic hematuria: Secondary | ICD-10-CM

## 2014-02-20 DIAGNOSIS — N200 Calculus of kidney: Secondary | ICD-10-CM

## 2014-02-20 DIAGNOSIS — R109 Unspecified abdominal pain: Secondary | ICD-10-CM

## 2014-02-20 NOTE — Telephone Encounter (Signed)
Pt came by office and has not passed her kidney stones yet. Pt is wondering what the next step is ?? Please advise

## 2014-02-20 NOTE — Telephone Encounter (Signed)
Left message for Ms. Bann that the next step would be a referral to an urologist.  Advised Dr. Diona Browner would be back in the office on Tuesday  02/21/2013 and once she has put in that referral, Rosaria Ferries will get her set up and call her with the appointment.

## 2014-02-24 ENCOUNTER — Telehealth: Payer: Self-pay | Admitting: Internal Medicine

## 2014-02-24 NOTE — Telephone Encounter (Signed)
Patient states she is having problems with being incontinent of loose stool small amounts. States she has a hx of rectocele surgery. Scheduled with Nicoletta Ba, PA on 02/28/14 at 2:00 PM

## 2014-02-25 ENCOUNTER — Encounter: Payer: Self-pay | Admitting: Oncology

## 2014-02-25 ENCOUNTER — Telehealth: Payer: Self-pay | Admitting: Oncology

## 2014-02-25 NOTE — Telephone Encounter (Signed)
, °

## 2014-02-28 ENCOUNTER — Ambulatory Visit (INDEPENDENT_AMBULATORY_CARE_PROVIDER_SITE_OTHER): Payer: Medicare PPO | Admitting: Physician Assistant

## 2014-02-28 ENCOUNTER — Encounter: Payer: Self-pay | Admitting: Physician Assistant

## 2014-02-28 VITALS — BP 134/70 | HR 72 | Ht 61.0 in | Wt 147.8 lb

## 2014-02-28 DIAGNOSIS — K644 Residual hemorrhoidal skin tags: Secondary | ICD-10-CM

## 2014-02-28 DIAGNOSIS — R151 Fecal smearing: Secondary | ICD-10-CM

## 2014-02-28 DIAGNOSIS — N816 Rectocele: Secondary | ICD-10-CM

## 2014-02-28 NOTE — Progress Notes (Signed)
Subjective:    Patient ID: Adrienne [name], female    DOB: 1941/08/30, 73 y.o.   MRN: [id number]  HPI   Adrienne [name] is a pleasant 73 year old female known to Dr. Delfin Edis. She does have history of breast cancer and nephrolithiasis. Also with history of spinal stenosis depression and bipolar disorder. She had a normal colonoscopy in July of 2013. She is status post remote repair of a rectocele. Patient states that she has had external hemorrhoids for a long time. She  comes in today because she has started having seepage of liquid from rectum after she has a bowel movement. She says this has been occurring over the past couple of months and is intermittent. She says it she's not sure that the drainage is actually stool but just has a small amount of liquid requiring her to wear a pad. She has not seen any blood. She has no complaints of rectal pain or discomfort and has had no changes in her bowel habits. She says she is having solid formed bowel movements without straining or rectal discomfort. She says that her perianal skin has become irritated because of the seepage.    Review of Systems  Constitutional: Negative.   HENT: Negative.   Eyes: Negative.   Respiratory: Negative.   Cardiovascular: Negative.   Gastrointestinal: Negative.   Endocrine: Negative.   Genitourinary: Positive for flank pain.  Musculoskeletal: Negative.   Skin: Negative.   Allergic/Immunologic: Negative.   Neurological: Negative.   Hematological: Negative.    Outpatient Prescriptions Prior to Visit  Medication Sig Dispense Refill  . aspirin 81 MG EC tablet Take 81 mg by mouth daily.        . Calcium Carbonate (CALCIUM 600 PO) Take 600 mg by mouth 2 (two) times daily.        . Cholecalciferol (VITAMIN D) 1000 UNITS capsule Take 1,000 Units by mouth daily.        . fish oil-omega-3 fatty acids 1000 MG capsule Take 1 g by mouth daily.      Marland Kitchen gabapentin (NEURONTIN) 300 MG capsule TAKE 2 CAPSULES TWICE DAILY  360  capsule  3  . LORazepam (ATIVAN) 1 MG tablet Take 0.5 mg by mouth at bedtime as needed.       . Multiple Vitamin (MULTIVITAMIN) capsule Take 1 capsule by mouth daily.        . Multiple Vitamins-Minerals (OCUVITE PO) Take 1 tablet by mouth 2 (two) times daily.        . phenelzine (NARDIL) 15 MG tablet Take 30 mg by mouth 2 (two) times daily.       . tamsulosin (FLOMAX) 0.4 MG CAPS capsule Take 1 capsule (0.4 mg total) by mouth daily.  30 capsule  0  . loperamide (IMODIUM) 1 MG/5ML solution Take 1 mg by mouth 2 (two) times daily.        No facility-administered medications prior to visit.   Allergies  Allergen Reactions  . Ivp Dye [Iodinated Diagnostic Agents]     CARDIAC ARREST  . Penicillins     REACTION: Anaphylactic Shock  . Metoclopramide Hcl Anxiety    REACTION: Rash   Patient Active Problem List   Diagnosis Date Noted  . Rectocele 02/28/2014  . Left flank pain 02/10/2014  . Hematuria, microscopic 02/10/2014  . Recurrent nephrolithiasis 01/12/2014  . Skin rash 06/29/2012  . OSTEOPENIA 12/22/2009  . ANEMIA, NORMOCYTIC 11/14/2009  . MALIGNANT NEOPLASM OF BREAST UNSPECIFIED SITE 07/25/2008  . LOW BACK PAIN, CHRONIC  04/12/2008  . GENITAL HERPES 11/16/2007  . DEPRESSION 10/08/2007  . SPINAL STENOSIS 10/08/2007   History  Substance Use Topics  . Smoking status: Never Smoker   . Smokeless tobacco: Never Used  . Alcohol Use: 0.0 oz/week    10-14 Glasses of wine per week     Comment: 1 cocktail and 1 glass of wine daily   family history includes Alzheimer's disease in her father; Breast cancer in her mother; Heart attack in her father; Hyperlipidemia in her brother; Hypertension in her brother; Pancreatic cancer in her father; Prostate cancer in her father; Ulcerative colitis in her daughter and mother. There is no history of Colon cancer.      Objective:   Physical Exam  well-developed older white female in no acute distress, very pleasant blood pressure 134/70 pulse 72  height 5 foot 1 weight 147. HEENT ;nontraumatic normocephalic EOMI PERRLA sclera anicteric, Supple ;no JVD, Cardiovascular ;regular rate and rhythm with S1-S2 no murmur or gallop, Pulmonary ;clear bilaterally, Abdomen; soft nontender nondistended bowel sounds are active there is no palpable mass or hepatosplenomegaly, Rectal; she has large external hemorrhoid tags and significantly decreased external sphincter tone, no lesion felt and no evidence of fistula ., Extremities ;no clubbing cyanosis or edema skin warm and dry, Psych ;mood and affect normal and appropriate        Assessment & Plan:  #50  73 year old female with fecal soilage occurring after bowel movements. On exam she has significantly decreased sphincter tone and is status post repair of  remote rectocele. #2 external hemorrhoids #3 personal history of breast cancer #4 negative colonoscopy July 2013 #5 history of ureteral lithiasis and nephrolithiasis  Plan; as patient is just having a very small amount of seepage will try to manage with bulking her stools. Will start Benefiber once daily. Balmex for external perianal skin  irritation 2-3 times daily as needed She was also given samples of recta care/lidocaine for when necessary use for external hemorrhoidal discomfort. She will followup with Dr. Olevia Perches in a couple of months. If her symptoms progress she may require referral for biofeedback, anorectal manometry etc.

## 2014-02-28 NOTE — Progress Notes (Signed)
Reviewed and agree.

## 2014-02-28 NOTE — Patient Instructions (Signed)
Use Benefiber once daily to help bulk up your stools. Use Recticare ( Over the Counter) cream, samples provided. You can get this at the pharmacy, [name]. For hemorrhoid discomfort. Use Balmex cream for external skin irritation.  You can get this also at the [facility].    Follow up with Dr. Olevia Perches in several months.

## 2014-03-07 ENCOUNTER — Ambulatory Visit (HOSPITAL_BASED_OUTPATIENT_CLINIC_OR_DEPARTMENT_OTHER): Payer: Commercial Managed Care - HMO | Admitting: Oncology

## 2014-03-07 ENCOUNTER — Other Ambulatory Visit (HOSPITAL_BASED_OUTPATIENT_CLINIC_OR_DEPARTMENT_OTHER): Payer: Commercial Managed Care - HMO

## 2014-03-07 VITALS — BP 143/67 | HR 86 | Temp 98.4°F | Resp 18 | Ht 61.0 in | Wt 148.9 lb

## 2014-03-07 DIAGNOSIS — D649 Anemia, unspecified: Secondary | ICD-10-CM

## 2014-03-07 DIAGNOSIS — Z87898 Personal history of other specified conditions: Secondary | ICD-10-CM

## 2014-03-07 DIAGNOSIS — C50919 Malignant neoplasm of unspecified site of unspecified female breast: Secondary | ICD-10-CM

## 2014-03-07 DIAGNOSIS — M949 Disorder of cartilage, unspecified: Secondary | ICD-10-CM

## 2014-03-07 DIAGNOSIS — M899 Disorder of bone, unspecified: Secondary | ICD-10-CM

## 2014-03-07 DIAGNOSIS — N200 Calculus of kidney: Secondary | ICD-10-CM

## 2014-03-07 LAB — CBC WITH DIFFERENTIAL/PLATELET
BASO%: 1.1 % (ref 0.0–2.0)
Basophils Absolute: 0.1 10*3/uL (ref 0.0–0.1)
EOS ABS: 0.3 10*3/uL (ref 0.0–0.5)
EOS%: 5.9 % (ref 0.0–7.0)
HCT: 34 % — ABNORMAL LOW (ref 34.8–46.6)
HGB: 10.9 g/dL — ABNORMAL LOW (ref 11.6–15.9)
LYMPH%: 34.5 % (ref 14.0–49.7)
MCH: 28.4 pg (ref 25.1–34.0)
MCHC: 32.1 g/dL (ref 31.5–36.0)
MCV: 88.6 fL (ref 79.5–101.0)
MONO#: 0.5 10*3/uL (ref 0.1–0.9)
MONO%: 10.8 % (ref 0.0–14.0)
NEUT%: 47.7 % (ref 38.4–76.8)
NEUTROS ABS: 2.4 10*3/uL (ref 1.5–6.5)
Platelets: 267 10*3/uL (ref 145–400)
RBC: 3.84 10*6/uL (ref 3.70–5.45)
RDW: 13.7 % (ref 11.2–14.5)
WBC: 5 10*3/uL (ref 3.9–10.3)
lymph#: 1.7 10*3/uL (ref 0.9–3.3)

## 2014-03-07 LAB — COMPREHENSIVE METABOLIC PANEL (CC13)
ALT: 16 U/L (ref 0–55)
ANION GAP: 9 meq/L (ref 3–11)
AST: 22 U/L (ref 5–34)
Albumin: 3.8 g/dL (ref 3.5–5.0)
Alkaline Phosphatase: 60 U/L (ref 40–150)
BILIRUBIN TOTAL: 0.26 mg/dL (ref 0.20–1.20)
BUN: 16.2 mg/dL (ref 7.0–26.0)
CALCIUM: 9.9 mg/dL (ref 8.4–10.4)
CHLORIDE: 105 meq/L (ref 98–109)
CO2: 29 meq/L (ref 22–29)
Creatinine: 0.8 mg/dL (ref 0.6–1.1)
Glucose: 110 mg/dl (ref 70–140)
Potassium: 4.7 mEq/L (ref 3.5–5.1)
SODIUM: 142 meq/L (ref 136–145)
Total Protein: 7.1 g/dL (ref 6.4–8.3)

## 2014-03-07 NOTE — Progress Notes (Signed)
Hematology and Oncology Follow Up Visit  Adrienne [name] [id number] 1941-01-12 73 y.o. 03/07/2014 7:16 PM   Principle Diagnosis: Encounter Diagnoses  Name Primary?  Adrienne [name] MALIGNANT NEOPLASM OF BREAST UNSPECIFIED SITE Yes  . 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 She underwent lumpectomy followed by radiation. 0.4 cm tumor, ER/PR negative. She has had no evidence of new disease since that time. Most recent mammograms done 07/04/2013.  She is also followed for a chronic normochromic anemia which has been stable for many years. I don't see the need to put her through a bone marrow biopsy.  She continues to do well at this time. She has had no interim medical problems. She denied any severe headache or change in vision. No focal bone pain. No change in bowel habit. No vaginal bleeding.  She is going to live for nice trip to [place] in about 2 weeks and is very excited about this.  Medications: reviewed  Allergies:  Allergies  Allergen Reactions  . Ivp Dye [Iodinated Diagnostic Agents]     CARDIAC ARREST  . Penicillins     REACTION: Anaphylactic Shock  . Metoclopramide Hcl Anxiety    REACTION: Rash    Review of Systems: Hematology:  No bleeding or bruising ENT ROS: No sore throat Breast ROS:  Respiratory ROS: No cough or dyspnea Cardiovascular ROS:  No chest pain or palpitations Gastrointestinal ROS: No abdominal pain or change in bowel habit. She has had problems with loose bowels. This was helped significantly by using Metamucil.    Genito-Urinary ROS: No urinary tract symptoms. No vaginal bleeding. Musculoskeletal ROS: No muscle bone or joint pain Neurological ROS: No headache or change in vision Dermatological ROS: No rash Remaining ROS negative:   Physical Exam: Blood pressure 143/67, pulse 86, temperature 98.4 F (36.9 C),  temperature source Oral, resp. rate 18, height $RemoveBe'5\' 1"'[id number]$  (1.549 m), weight 148 lb 14.4 oz (67.541 kg), SpO2 98.00%. Wt Readings from Last 3 Encounters:  03/07/14 148 lb 14.4 oz (67.541 kg)  02/28/14 147 lb 12.8 oz (67.042 kg)  02/10/14 147 lb 12 oz (67.019 kg)     General appearance: Well-nourished Caucasian woman HENNT: Pharynx no erythema, exudate, mass, or ulcer. No thyromegaly or thyroid nodules Lymph nodes: No cervical, supraclavicular, or axillary lymphadenopathy Breasts: No abnormal skin changes, surgical changes left breast, inverted nipple on the left, no dominant mass in either breast Lungs: Clear to auscultation, resonant to percussion throughout Heart: Regular rhythm, no murmur, no gallop, no rub, no click, no edema Abdomen: Soft, nontender, normal bowel sounds, no mass, no organomegaly Extremities: No edema, no calf tenderness Musculoskeletal: no joint deformities GU:  Vascular: Carotid pulses 2+, no bruits,  Neurologic: Alert, oriented, PERRLA,  , cranial nerves grossly normal, motor strength 5 over 5, reflexes 1+ symmetric, upper body coordination normal, gait normal, Skin: No rash or ecchymosis  Lab Results: CBC W/Diff    Component Value Date/Time   WBC 5.0 03/07/2014 1359   WBC 4.8 01/14/2011   WBC 3.5* 11/05/2009 0918   RBC 3.84 03/07/2014 1359   RBC 3.68 01/14/2011   HGB 10.9* 03/07/2014 1359   HGB 10.6 01/14/2011   HGB 10.6* 11/05/2009 0918   HCT 34.0* 03/07/2014 1359   HCT 32.6 01/14/2011   HCT 31.6* 11/05/2009 0918   PLT 267 03/07/2014 1359   PLT 231 01/14/2011   PLT 233  11/05/2009 0918   MCV 88.6 03/07/2014 1359   MCV 88.6 01/14/2011   MCV 88 11/05/2009 0918   MCH 28.4 03/07/2014 1359   MCH 28.8 01/14/2011 1530   MCH 29.5 11/05/2009 0918   MCHC 32.1 03/07/2014 1359   MCHC 31.5 11/07/2010 0823   MCHC 33.7 11/05/2009 0918   RDW 13.7 03/07/2014 1359   RDW 13.1 01/14/2011   RDW 12.5 11/05/2009 0918   LYMPHSABS 1.7 03/07/2014 1359   LYMPHSABS 1.3 11/05/2009 0918   LYMPHSABS  2.4 07/05/2008 1450   MONOABS 0.5 03/07/2014 1359   MONOABS 0.4 07/05/2008 1450   EOSABS 0.3 03/07/2014 1359   EOSABS 0.2 11/05/2009 0918   EOSABS 0.3 07/05/2008 1450   BASOSABS 0.1 03/07/2014 1359   BASOSABS 0.0 11/05/2009 0918   BASOSABS 0.0 07/05/2008 1450     Chemistry      Component Value Date/Time   NA 142 03/07/2014 1400   NA 141 07/27/2013 1015   NA 141 11/05/2009 0918   K 4.7 03/07/2014 1400   K 4.1 07/27/2013 1015   K 4.4 11/05/2009 0918   CL 103 07/27/2013 1015   CL 103 03/08/2013 1203   CL 98 11/05/2009 0918   CO2 29 03/07/2014 1400   CO2 30 07/27/2013 1015   CO2 30 11/05/2009 0918   BUN 16.2 03/07/2014 1400   BUN 16 07/27/2013 1015   BUN 15 11/05/2009 0918   CREATININE 0.8 03/07/2014 1400   CREATININE 0.7 07/27/2013 1015   CREATININE 0.6 11/05/2009 0918      Component Value Date/Time   CALCIUM 9.9 03/07/2014 1400   CALCIUM 9.5 07/27/2013 1015   CALCIUM 9.3 11/05/2009 0918   ALKPHOS 60 03/07/2014 1400   ALKPHOS 54 07/27/2013 1015   AST 22 03/07/2014 1400   AST 22 07/27/2013 1015   ALT 16 03/07/2014 1400   ALT 17 07/27/2013 1015   BILITOT 0.26 03/07/2014 1400   BILITOT 0.4 07/27/2013 1015       Radiological Studies: Dg Abd 1 View  02/10/2014   CLINICAL DATA:  Left flank pain  EXAM: ABDOMEN - 1 VIEW  COMPARISON:  None.  FINDINGS: There is nonspecific nonobstructive bowel gas pattern. Abundant stool noted in right colon and proximal transverse colon. At least 2 calcified calculi are noted in left renal region the largest measures 1.4 mm.  IMPRESSION: Calcified calculi in left renal region the largest measures 2 mm.   Electronically Signed   By: Lahoma Crocker M.D.   On: 02/10/2014 13:03    Impression:  #1. High-grade DCIS left breast treated as outlined above. She remains free of any new disease now out almost 6 years from diagnosis in July 2009. She would like to continue annual followup at the cancer center.  I will transition her care to Dr. Jana Hakim.  #2. Chronic, stable, normochromic  anemia Negative myeloma screen. Normal renal function.  #3. Nephrolithiasis   CC: Patient Care Team: Jinny Sanders, MD as PCP - General   Annia Belt, MD 3/10/20157:16 PM

## 2014-03-08 ENCOUNTER — Telehealth: Payer: Self-pay | Admitting: *Deleted

## 2014-03-08 NOTE — Telephone Encounter (Signed)
Lm gv appts for Mammo for 07/06/14@ 10am [facility]. i also gv appt for 03/11/14 w/labs@ 1:30pm and ov@ 2pm. Pt is aware that i will mail a letter/avs...td

## 2014-03-09 LAB — VITAMIN D 25 HYDROXY (VIT D DEFICIENCY, FRACTURES): VIT D 25 HYDROXY: 60 ng/mL (ref 30–89)

## 2014-03-10 ENCOUNTER — Telehealth: Payer: Self-pay | Admitting: *Deleted

## 2014-03-10 NOTE — Telephone Encounter (Signed)
Message copied by Jesse Fall on Fri Mar 10, 2014  2:42 PM ------      Message from: Annia Belt      Created: Thu Mar 09, 2014  7:30 AM       Call pt: vitamin D level normal ------

## 2014-03-10 NOTE — Telephone Encounter (Signed)
Notified pt of normal Vit D level per Dr Azucena Freed request.

## 2014-03-28 ENCOUNTER — Telehealth: Payer: Self-pay | Admitting: Internal Medicine

## 2014-03-28 NOTE — Telephone Encounter (Signed)
LEFT MESSAGE AND GAVE NEW PROVIDER AND  DATE/TIME FOR 03/14 @ 1:30 W/DR. CHISM. CALENDAR MAILED.

## 2014-04-04 ENCOUNTER — Telehealth: Payer: Self-pay

## 2014-04-04 NOTE — Telephone Encounter (Signed)
Noted  

## 2014-04-04 NOTE — Telephone Encounter (Signed)
Pt left v/m; pt wanted Dr Diona Browner to know that she had passed the 2 kidney stones; pt is not sure date she passed stones but her pain is gone.

## 2014-04-10 ENCOUNTER — Ambulatory Visit (INDEPENDENT_AMBULATORY_CARE_PROVIDER_SITE_OTHER)
Admission: RE | Admit: 2014-04-10 | Discharge: 2014-04-10 | Disposition: A | Discharge status: Home / Self Care | Payer: Commercial Managed Care - HMO | Source: Ambulatory Visit | Attending: Family Medicine | Admitting: Family Medicine

## 2014-04-10 ENCOUNTER — Encounter: Payer: Self-pay | Admitting: Family Medicine

## 2014-04-10 ENCOUNTER — Ambulatory Visit (INDEPENDENT_AMBULATORY_CARE_PROVIDER_SITE_OTHER): Payer: Commercial Managed Care - HMO | Admitting: Family Medicine

## 2014-04-10 VITALS — BP 132/74 | HR 83 | Temp 97.9°F | Wt 150.5 lb

## 2014-04-10 DIAGNOSIS — N949 Unspecified condition associated with female genital organs and menstrual cycle: Secondary | ICD-10-CM

## 2014-04-10 DIAGNOSIS — R102 Pelvic and perineal pain: Secondary | ICD-10-CM

## 2014-04-10 NOTE — Progress Notes (Signed)
Subjective:   Patient ID: Adrienne [name], female    DOB: 02/22/1941, 73 y.o.   MRN: [id number]  Adrienne [name] is a pleasant 73 y.o. year old female patient of Dr. Diona Browner, new to me, who presents to clinic today with Tailbone Pain  on 04/10/2014  HPI: [place] while shopping in 01/2014.  Landed hard on her bottom and her right arm outstretched to catch her fall.  Arm hurt initially but that has resolved.  Her tail bone and "both sides of her lower buttocks" still hurts significantly when she sits or stands from seated position or bends.  No pain with or difficulty walking.  Just returned from trip to [place] where she did a lot of walking.  Does have h/o spinal stenosis.  No changes in her bowel habits or urinary function.  No LE weakness, edema or radiculopathy.  Patient Active Problem List   Diagnosis Date Noted  . Pain in female pelvis 04/10/2014  . Rectocele 02/28/2014  . Left flank pain 02/10/2014  . Hematuria, microscopic 02/10/2014  . Recurrent nephrolithiasis 01/12/2014  . Skin rash 06/29/2012  . OSTEOPENIA 12/22/2009  . ANEMIA, NORMOCYTIC 11/14/2009  . MALIGNANT NEOPLASM OF BREAST UNSPECIFIED SITE 07/25/2008  . LOW BACK PAIN, CHRONIC 04/12/2008  . GENITAL HERPES 11/16/2007  . DEPRESSION 10/08/2007  . SPINAL STENOSIS 10/08/2007   Past Medical History  Diagnosis Date  . Lumbago   . Genital herpes, unspecified   . Depressive disorder, not elsewhere classified   . Spinal stenosis, unspecified region other than cervical   . Anemia, unspecified   . Breast cancer     left  . Disorder of bone and cartilage, unspecified   . Macular degeneration   . Bronchitis   . External hemorrhoids   . IBS (irritable bowel syndrome)   . Depression   . Bipolar affective disorder    Past Surgical History  Procedure Laterality Date  . Abdominal hysterectomy      Total but cervix left  . Breast lumpectomy  6/09    Left; high grade carcinoma in situ  . Rectocele repair    . Mri  lumbar  05/2007    L4-L5 spinal stenosis  . Bilateral tuboplasty    . Tonsillectomy    . Dilation and curettage of uterus      x 2   History  Substance Use Topics  . Smoking status: Never Smoker   . Smokeless tobacco: Never Used  . Alcohol Use: 0.0 oz/week    10-14 Glasses of wine per week     Comment: 1 cocktail and 1 glass of wine daily   Family History  Problem Relation Age of Onset  . Pancreatic cancer Father   . Prostate cancer Father   . Alzheimer's disease Father   . Heart attack Father   . Ulcerative colitis Mother   . Breast cancer Mother   . Hypertension Brother   . Hyperlipidemia Brother   . Ulcerative colitis Daughter   . Colon cancer Neg Hx    Allergies  Allergen Reactions  . Ivp Dye [Iodinated Diagnostic Agents]     CARDIAC ARREST  . Penicillins     REACTION: Anaphylactic Shock  . Metoclopramide Hcl Anxiety    REACTION: Rash   Current Outpatient Prescriptions on File Prior to Visit  Medication Sig Dispense Refill  . aspirin 81 MG EC tablet Take 81 mg by mouth daily.        . Calcium Carbonate (CALCIUM 600 PO) Take 600  mg by mouth 2 (two) times daily.        . Cholecalciferol (VITAMIN D) 1000 UNITS capsule Take 1,000 Units by mouth daily.        . fish oil-omega-3 fatty acids 1000 MG capsule Take 1 g by mouth daily.      Marland Kitchen gabapentin (NEURONTIN) 300 MG capsule TAKE 2 CAPSULES TWICE DAILY  360 capsule  3  . Loperamide HCl (IMODIUM A-D PO) Take 1 capsule by mouth 2 (two) times daily.      Marland Kitchen LORazepam (ATIVAN) 1 MG tablet Take 0.5 mg by mouth at bedtime as needed.       . Multiple Vitamin (MULTIVITAMIN) capsule Take 1 capsule by mouth daily.        . Multiple Vitamins-Minerals (OCUVITE PO) Take 1 tablet by mouth 2 (two) times daily.        . phenelzine (NARDIL) 15 MG tablet Take 30 mg by mouth 2 (two) times daily.       . tamsulosin (FLOMAX) 0.4 MG CAPS capsule Take 1 capsule (0.4 mg total) by mouth daily.  30 capsule  0  . [DISCONTINUED] Omega-3-acid Ethyl  Esters (LOVAZA PO) Take 2,000 mg by mouth 2 (two) times daily in the am and at bedtime..         No current facility-administered medications on file prior to visit.   The PMH, PSH, Social History, Family History, Medications, and allergies have been reviewed in [facility], and have been updated if relevant.   Review of Systems    See HPI Objective:    BP 132/74  Pulse 83  Temp(Src) 97.9 F (36.6 C) (Oral)  Wt 150 lb 8 oz (68.266 kg)  SpO2 98%   Physical Exam  Constitutional: She appears well-developed and well-nourished. No distress.  Musculoskeletal:       Lumbar back: She exhibits decreased range of motion. She exhibits no tenderness, no bony tenderness, no swelling, no edema, no deformity, no laceration, no pain, no spasm and normal pulse.       Back:  Skin: Skin is warm, dry and intact.  Psychiatric: She has a normal mood and affect. Her speech is normal and behavior is normal. Judgment and thought content normal. Cognition and memory are normal.          Assessment & Plan:   Pain in female pelvis - Plan: DG Sacrum/Coccyx, DG Hip Bilateral W/Pelvis No Follow-up on file.

## 2014-04-10 NOTE — Assessment & Plan Note (Signed)
No pain over coccyx, more lower pelvis. Given how long ago injury occurred, I do feel imaging is warranted. Will gets xrays today. Orders Placed This Encounter  Procedures  . DG Sacrum/Coccyx  . DG Hip Bilateral W/Pelvis   The patient indicates understanding of these issues and agrees with the plan.

## 2014-04-10 NOTE — Patient Instructions (Signed)
Good to see you. We will call you with your xray results.   

## 2014-04-10 NOTE — Progress Notes (Signed)
Pre visit review using our clinic review tool, if applicable. No additional management support is needed unless otherwise documented below in the visit note. 

## 2014-04-11 ENCOUNTER — Telehealth: Payer: Self-pay | Admitting: *Deleted

## 2014-04-11 NOTE — Telephone Encounter (Signed)
Patient notified of x-ray results.  [name] wants to know what is causing her pain.  She states her pain occurs when sitting & bending.  Asking if a MRI should be ordered.  Advised I would send a message to Dr. Deborra Medina since she was the one who had seen her for this problem to see what she recommends.  Please Advise.

## 2014-04-11 NOTE — Telephone Encounter (Signed)
It is possible she may need an MRI but I would advise seeing Dr. Diona Browner first to discuss next steps.  Please schedule a follow up appointment for her.

## 2014-04-11 NOTE — Telephone Encounter (Signed)
Patient left a voicemail stating that she had an xray and was told that someone was going to call her yesterday. Patient would like a call back with results.

## 2014-04-11 NOTE — Telephone Encounter (Signed)
Yes I was going to call her but it looks like her PCP, Dr. Diona Browner, already contacted her or asked someone to contact her.  See xray report.

## 2014-04-11 NOTE — Telephone Encounter (Signed)
Follow up appointment scheduled 04/14/2014 @ 12:00 pm with Dr. Diona Browner.

## 2014-04-14 ENCOUNTER — Ambulatory Visit (INDEPENDENT_AMBULATORY_CARE_PROVIDER_SITE_OTHER): Payer: Commercial Managed Care - HMO | Admitting: Family Medicine

## 2014-04-14 ENCOUNTER — Encounter: Payer: Self-pay | Admitting: Family Medicine

## 2014-04-14 VITALS — BP 140/80 | HR 80 | Temp 98.5°F | Ht 61.0 in | Wt 151.5 lb

## 2014-04-14 DIAGNOSIS — N949 Unspecified condition associated with female genital organs and menstrual cycle: Secondary | ICD-10-CM

## 2014-04-14 DIAGNOSIS — R102 Pelvic and perineal pain: Secondary | ICD-10-CM

## 2014-04-14 MED ORDER — MELOXICAM 15 MG PO TABS: 15.0000 mg | ORAL_TABLET | Freq: Every day | ORAL | Status: DC

## 2014-04-14 NOTE — Patient Instructions (Signed)
Start meloxicam daily x 2 week. Start home PT. If not improving in 2 weeks call for PT referral.

## 2014-04-14 NOTE — Assessment & Plan Note (Signed)
Most likely due to recent injury. Start NSAID, gentle stretching ( info given) and het.  If not improving consider PT and possible referral.

## 2014-04-14 NOTE — Progress Notes (Signed)
Spoke with pharmacist,Mark Lamden at [facility] and cancelled Meloxicam order.

## 2014-04-14 NOTE — Progress Notes (Signed)
Pre visit review using our clinic review tool, if applicable. No additional management support is needed unless otherwise documented below in the visit note. 

## 2014-04-14 NOTE — Progress Notes (Signed)
   Subjective:    Patient ID: Adrienne [name], female    DOB: 04/19/1941, 73 y.o.   MRN: [id number]  HPI   73 year old female presents with continue tailbone pain ongoing now for 2 months.  Fell while shopping in 01/2014. Landed hard on her bottom and her right arm outstretched to catch her fall. Arm hurt initially but that has resolved. Her tail bone and "both sides of her lower buttocks" still hurts significantly when she sits or stands from seated position or bends. No pain with or difficulty walking. Just returned from trip to [place] where she did a lot of walking.  Does have h/o spinal stenosis.  No changes in her bowel habits or urinary function.  No LE weakness, edema or radiculopathy.   She saw Dr. 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 Degenerative or old posttraumatic changes at the inferior aspect  of the right acetabulum and at the tip of the right greater  trochanter.  She has tried heat, tylenol without relief.     Review of Systems  Constitutional: Negative for fever and fatigue.  HENT: Negative for ear pain.   Eyes: Negative for pain.  Respiratory: Negative for chest tightness and shortness of breath.   Cardiovascular: Negative for chest pain, palpitations and leg swelling.  Gastrointestinal: Negative for abdominal pain.  Genitourinary: Negative for dysuria.       Objective:   Physical Exam  Constitutional: She appears well-developed.  Neck: Normal range of motion. Neck supple.  Cardiovascular: Normal rate.   Pulmonary/Chest: Effort normal.  Musculoskeletal:       Right hip: Normal.       Left hip: Normal.       Lumbar back: She exhibits decreased range of motion. She exhibits no tenderness, no bony tenderness, no swelling, no deformity and no spasm.  Pain only with bending, none with palpation  NO focal SI joint pain, neg Faber's          Assessment & Plan:

## 2014-05-25 ENCOUNTER — Other Ambulatory Visit: Payer: Self-pay | Admitting: Family Medicine

## 2014-05-25 NOTE — Telephone Encounter (Signed)
Pt left v/m requesting meloxicam refill to [facility];pt request cb when sent to pharmacy. Pt is out of Meloxicam and med helped with pain in buttock.

## 2014-05-26 NOTE — Telephone Encounter (Signed)
Ms. Lantier notified Meloxicam refill has been sent to her pharmacy.

## 2014-05-29 ENCOUNTER — Telehealth: Payer: Self-pay

## 2014-05-29 DIAGNOSIS — M7072 Other bursitis of hip, left hip: Secondary | ICD-10-CM

## 2014-05-29 DIAGNOSIS — M7071 Other bursitis of hip, right hip: Secondary | ICD-10-CM

## 2014-05-29 NOTE — Telephone Encounter (Signed)
Pt left note requesting referral to Dr Leanord Hawking at [facility] for local injection of the bursa. Pt brought letter from [employer]. That he suspects pt has a sub acute post traumatic contussion the the rt and lt ischiogluteal bursae. Copy of letter placed in Dr Rometta Emery in box and sent for scanning.

## 2014-06-07 ENCOUNTER — Other Ambulatory Visit: Payer: Self-pay | Admitting: Physical Medicine and Rehabilitation

## 2014-06-07 ENCOUNTER — Other Ambulatory Visit: Payer: Commercial Managed Care - HMO

## 2014-06-07 DIAGNOSIS — M25559 Pain in unspecified hip: Secondary | ICD-10-CM

## 2014-06-08 ENCOUNTER — Ambulatory Visit
Admission: RE | Admit: 2014-06-08 | Discharge: 2014-06-08 | Disposition: A | Discharge status: Home / Self Care | Payer: Commercial Managed Care - HMO | Source: Ambulatory Visit | Attending: Physical Medicine and Rehabilitation | Admitting: Physical Medicine and Rehabilitation

## 2014-06-08 DIAGNOSIS — M25559 Pain in unspecified hip: Secondary | ICD-10-CM

## 2014-06-19 ENCOUNTER — Other Ambulatory Visit: Payer: Self-pay | Admitting: Physical Medicine and Rehabilitation

## 2014-06-19 DIAGNOSIS — M25551 Pain in right hip: Secondary | ICD-10-CM

## 2014-06-19 DIAGNOSIS — M25552 Pain in left hip: Principal | ICD-10-CM

## 2014-06-29 ENCOUNTER — Other Ambulatory Visit: Payer: Self-pay | Admitting: Physical Medicine and Rehabilitation

## 2014-06-29 ENCOUNTER — Ambulatory Visit
Admission: RE | Admit: 2014-06-29 | Discharge: 2014-06-29 | Disposition: A | Discharge status: Home / Self Care | Payer: Commercial Managed Care - HMO | Source: Ambulatory Visit | Attending: Physical Medicine and Rehabilitation | Admitting: Physical Medicine and Rehabilitation

## 2014-06-29 DIAGNOSIS — M25552 Pain in left hip: Principal | ICD-10-CM

## 2014-06-29 DIAGNOSIS — M25551 Pain in right hip: Secondary | ICD-10-CM

## 2014-06-29 MED ORDER — METHYLPREDNISOLONE ACETATE 40 MG/ML INJ SUSP (RADIOLOG
120.0000 mg | Freq: Once | INTRAMUSCULAR | Status: AC
  Administered 2014-06-29: 120 mg via INTRA_ARTICULAR

## 2014-07-06 ENCOUNTER — Encounter (INDEPENDENT_AMBULATORY_CARE_PROVIDER_SITE_OTHER): Payer: Self-pay

## 2014-07-06 ENCOUNTER — Ambulatory Visit
Admission: RE | Admit: 2014-07-06 | Discharge: 2014-07-06 | Disposition: A | Discharge status: Home / Self Care | Payer: Commercial Managed Care - HMO | Source: Ambulatory Visit | Attending: Oncology | Admitting: Oncology

## 2014-07-06 DIAGNOSIS — C50919 Malignant neoplasm of unspecified site of unspecified female breast: Secondary | ICD-10-CM

## 2014-07-27 ENCOUNTER — Encounter: Payer: Self-pay | Admitting: Family Medicine

## 2014-07-27 ENCOUNTER — Ambulatory Visit (INDEPENDENT_AMBULATORY_CARE_PROVIDER_SITE_OTHER): Payer: Commercial Managed Care - HMO | Admitting: Family Medicine

## 2014-07-27 VITALS — BP 130/70 | HR 88 | Temp 98.5°F | Ht 61.0 in | Wt 147.8 lb

## 2014-07-27 DIAGNOSIS — R21 Rash and other nonspecific skin eruption: Secondary | ICD-10-CM

## 2014-07-27 NOTE — Progress Notes (Signed)
[place] [address] Phone: [phone] Fax: [phone]  Patient ID: Adrienne [name] MRN: [id number], DOB: [birth year], 73 y.o. Date of Encounter: 07/27/2014  Primary Physician:  Eliezer Lofts, MD   Chief Complaint: Rash   Subjective:   History of Present Illness:  Adrienne [name] is a 73 y.o. very pleasant female patient who presents with the following:  Some type of bites. These are scattered and more on the extremities are normal lower extremities. They're somewhat also on the buttocks region.  Mild case of shingles on the right. She states that it felt like what she felt like when she had shingles, but this was a much milder case.    Past Medical History, Surgical History, Social History, Family History, Problem List, Medications, and Allergies have been reviewed and updated if relevant.  Review of Systems:  GEN: No acute illnesses, no fevers, chills. GI: No n/v/d, eating normally Pulm: No SOB Interactive and getting along well at home.  Otherwise, ROS is as per the HPI.  Objective:   Physical Examination: BP 130/70  Pulse 88  Temp(Src) 98.5 F (36.9 C) (Oral)  Ht 5\' 1"  (1.549 m)  Wt 147 lb 12 oz (67.019 kg)  BMI 27.93 kg/m2   GEN: WDWN, NAD, Non-toxic, A & O x 3 HEENT: Atraumatic, Normocephalic. Neck supple. No masses, No LAD. Ears and Nose: No external deformity. CV: RRR, No M/G/R. No JVD. No thrill. No extra heart sounds. PULM: CTA B, no wheezes, crackles, rhonchi. No retractions. No resp. distress. No accessory muscle use. EXTR: No c/c/e NEURO Normal gait.  PSYCH: Normally interactive. Conversant. Not depressed or anxious appearing.  Calm demeanor.   Right-sided flat fading rassh  Scattered elevated reddish nodules with evidence of excoriation on the LE  Laboratory and Imaging Data:  Assessment & Plan:   Rash and nonspecific skin eruption  Reassurance. cortaid - 10 otc to lesions. I am not clear she had shingles, but  resolving  New Prescriptions   No medications on file   Discontinued Medications   No medications on file   Modified Medication Dosage or Refilled Medications 07/27/2014: Modified Medications   No medications on file   No orders of the defined types were placed in this encounter.   Follow-up: No Follow-up on file. Unless noted above, the patient is to follow-up if symptoms worsen. Red flags were reviewed with the patient.  Signed,  Maud Deed. Richards Pherigo, MD, [facility]  Current Medications at Discharge:   Medication List       This list is accurate as of: 07/27/14 11:59 PM.  Always use your most recent med list.               aspirin 81 MG EC tablet  Take 81 mg by mouth daily.     CALCIUM 600 PO  Take 600 mg by mouth 2 (two) times daily.     FIBERCON PO  Take by mouth as needed.     fish oil-omega-3 fatty acids 1000 MG capsule  Take 1 g by mouth daily.     gabapentin 300 MG capsule  Commonly known as:  NEURONTIN  TAKE 2 CAPSULES TWICE DAILY     IMODIUM A-D PO  Take 1 capsule by mouth 2 (two) times daily.     LORazepam 1 MG tablet  Commonly known as:  ATIVAN  Take 0.5 mg by mouth at bedtime as needed.     meloxicam 15 MG tablet  Commonly  known as:  MOBIC  TAKE 1 TABLET BY MOUTH DAILY     multivitamin capsule  Take 1 capsule by mouth daily.     OCUVITE PO  Take 1 tablet by mouth 2 (two) times daily.     phenelzine 15 MG tablet  Commonly known as:  NARDIL  Take 30 mg by mouth 2 (two) times daily.     tamsulosin 0.4 MG Caps capsule  Commonly known as:  FLOMAX  Take 1 capsule (0.4 mg total) by mouth daily.     Vitamin D 1000 UNITS capsule  Take 1,000 Units by mouth daily.

## 2014-07-27 NOTE — Progress Notes (Signed)
Pre visit review using our clinic review tool, if applicable. No additional management support is needed unless otherwise documented below in the visit note. 

## 2014-09-06 ENCOUNTER — Ambulatory Visit (INDEPENDENT_AMBULATORY_CARE_PROVIDER_SITE_OTHER): Payer: Commercial Managed Care - HMO | Admitting: Internal Medicine

## 2014-09-06 ENCOUNTER — Encounter: Payer: Self-pay | Admitting: Internal Medicine

## 2014-09-06 VITALS — BP 118/68 | HR 73 | Temp 98.8°F | Wt 146.0 lb

## 2014-09-06 DIAGNOSIS — B9789 Other viral agents as the cause of diseases classified elsewhere: Secondary | ICD-10-CM

## 2014-09-06 DIAGNOSIS — R52 Pain, unspecified: Secondary | ICD-10-CM

## 2014-09-06 DIAGNOSIS — B349 Viral infection, unspecified: Secondary | ICD-10-CM

## 2014-09-06 DIAGNOSIS — R509 Fever, unspecified: Secondary | ICD-10-CM

## 2014-09-06 NOTE — Patient Instructions (Addendum)

## 2014-09-06 NOTE — Progress Notes (Signed)
Subjective:    Patient ID: Adrienne [name], female    DOB: Jul 24, 1941, 73 y.o.   MRN: [id number]  HPI  Pt presents to the clinic today with c/o fever, chills and body aches. This started 2 days ago. She reports that her fever has been as high as 102. She has tried tylenol OTC with little relief. She does feel a little better today than she did yesterday. She has not had sick contacts that she is aware of.  Review of Systems      Past Medical History  Diagnosis Date  . Lumbago   . Genital herpes, unspecified   . Depressive disorder, not elsewhere classified   . Spinal stenosis, unspecified region other than cervical   . Anemia, unspecified   . Breast cancer     left  . Disorder of bone and cartilage, unspecified   . Macular degeneration   . Bronchitis   . External hemorrhoids   . IBS (irritable bowel syndrome)   . Depression   . Bipolar affective disorder     Current Outpatient Prescriptions  Medication Sig Dispense Refill  . aspirin 81 MG EC tablet Take 81 mg by mouth daily.        . Calcium Carbonate (CALCIUM 600 PO) Take 600 mg by mouth 2 (two) times daily.        . Calcium Polycarbophil (FIBERCON PO) Take by mouth as needed.      . Cholecalciferol (VITAMIN D) 1000 UNITS capsule Take 1,000 Units by mouth daily.        . fish oil-omega-3 fatty acids 1000 MG capsule Take 1 g by mouth daily.      Marland Kitchen gabapentin (NEURONTIN) 300 MG capsule TAKE 2 CAPSULES TWICE DAILY  360 capsule  3  . Loperamide HCl (IMODIUM A-D PO) Take 1 capsule by mouth 2 (two) times daily.      Marland Kitchen LORazepam (ATIVAN) 1 MG tablet Take 0.5 mg by mouth at bedtime as needed.       . meloxicam (MOBIC) 15 MG tablet TAKE 1 TABLET BY MOUTH DAILY  30 tablet  0  . Multiple Vitamin (MULTIVITAMIN) capsule Take 1 capsule by mouth daily.        . Multiple Vitamins-Minerals (OCUVITE PO) Take 1 tablet by mouth 2 (two) times daily.        . phenelzine (NARDIL) 15 MG tablet Take 30 mg by mouth 2 (two) times daily.       .  tamsulosin (FLOMAX) 0.4 MG CAPS capsule Take 1 capsule (0.4 mg total) by mouth daily.  30 capsule  0  . [DISCONTINUED] Omega-3-acid Ethyl Esters (LOVAZA PO) Take 2,000 mg by mouth 2 (two) times daily in the am and at bedtime..         No current facility-administered medications for this visit.    Allergies  Allergen Reactions  . Penicillins Anaphylaxis  . Ivp Dye [Iodinated Diagnostic Agents]     CARDIAC ARREST  . Metoclopramide Hcl Anxiety and Rash    Family History  Problem Relation Age of Onset  . Pancreatic cancer Father   . Prostate cancer Father   . Alzheimer's disease Father   . Heart attack Father   . Ulcerative colitis Mother   . Breast cancer Mother   . Hypertension Brother   . Hyperlipidemia Brother   . Ulcerative colitis Daughter   . Colon cancer Neg Hx     History   Social History  . Marital Status: Divorced  Spouse Name: N/A    Number of Children: 1  . Years of Education: N/A   Occupational History  . Med tech     Retired  .     Social History Main Topics  . Smoking status: Never Smoker   . Smokeless tobacco: Never Used  . Alcohol Use: 0.0 oz/week    10-14 Glasses of wine per week     Comment: 1 cocktail and 1 glass of wine daily  . Drug Use: No  . Sexual Activity: Not on file   Other Topics Concern  . Not on file   Social History Narrative   Working at [employer]      Divorced      No regular exercise; decreased because of spinal stenosis   Has MOST FORM completed, full code (reviewed 2014)                 Constitutional: Pt reports fever and chills. Denies malaise, fatigue, headache or abrupt weight changes.  HEENT: Denies eye pain, eye redness, ear pain, ringing in the ears, wax buildup, runny nose, nasal congestion, bloody nose, or sore throat. Respiratory: Denies difficulty breathing, shortness of breath, cough or sputum production.   Cardiovascular: Denies chest pain, chest tightness, palpitations or swelling in the  hands or feet.  GU: Denies urgency, frequency, pain with urination, burning sensation, blood in urine, odor or discharge. Musculoskeletal: Pt reports body aches. Denies decrease in range of motion, difficulty with gait, or joint swelling.   No other specific complaints in a complete review of systems (except as listed in HPI above).  Objective:   Physical Exam  BP 118/68  Pulse 73  Temp(Src) 98.8 F (37.1 C) (Oral)  Wt 146 lb (66.225 kg)  SpO2 98% Wt Readings from Last 3 Encounters:  09/06/14 146 lb (66.225 kg)  07/27/14 147 lb 12 oz (67.019 kg)  04/14/14 151 lb 8 oz (68.72 kg)    General: Appears her stated age, well developed, well nourished in NAD. HEENT:  Ears: Tm's gray and intact, normal light reflex; Nose: mucosa pink and moist, septum midline; Throat/Mouth: Teeth present, mucosa erythematous and moist, no exudate, lesions or ulcerations noted.  Cardiovascular: Normal rate and rhythm. S1,S2 noted.  No murmur, rubs or gallops noted.  Pulmonary/Chest: Normal effort and positive vesicular breath sounds. No respiratory distress. No wheezes, rales or ronchi noted.    BMET    Component Value Date/Time   NA 142 03/07/2014 1400   NA 141 07/27/2013 1015   NA 141 11/05/2009 0918   K 4.7 03/07/2014 1400   K 4.1 07/27/2013 1015   K 4.4 11/05/2009 0918   CL 103 07/27/2013 1015   CL 103 03/08/2013 1203   CL 98 11/05/2009 0918   CO2 29 03/07/2014 1400   CO2 30 07/27/2013 1015   CO2 30 11/05/2009 0918   GLUCOSE 110 03/07/2014 1400   GLUCOSE 99 07/27/2013 1015   GLUCOSE 101* 03/08/2013 1203   GLUCOSE 119* 11/05/2009 0918   BUN 16.2 03/07/2014 1400   BUN 16 07/27/2013 1015   BUN 15 11/05/2009 0918   CREATININE 0.8 03/07/2014 1400   CREATININE 0.7 07/27/2013 1015   CREATININE 0.6 11/05/2009 0918   CALCIUM 9.9 03/07/2014 1400   CALCIUM 9.5 07/27/2013 1015   CALCIUM 9.3 11/05/2009 0918   GFRNONAA >60 07/05/2008 1450   GFRAA  Value: >60        The eGFR has been calculated using the MDRD equation. This  calculation has  not been validated in all clinical 07/05/2008 1450    Lipid Panel     Component Value Date/Time   CHOL 232* 07/27/2013 1015   TRIG 114.0 07/27/2013 1015   HDL 74.80 07/27/2013 1015   CHOLHDL 3 07/27/2013 1015   VLDL 22.8 07/27/2013 1015   LDLCALC 113 11/07/2010 0823    CBC    Component Value Date/Time   WBC 5.0 03/07/2014 1359   WBC 4.8 01/14/2011   WBC 3.5* 11/05/2009 0918   RBC 3.84 03/07/2014 1359   RBC 3.68 01/14/2011   RBC 3.60* 11/05/2009 0918   HGB 10.9* 03/07/2014 1359   HGB 10.6 01/14/2011   HGB 10.6* 11/05/2009 0918   HCT 34.0* 03/07/2014 1359   HCT 32.6 01/14/2011   HCT 31.6* 11/05/2009 0918   PLT 267 03/07/2014 1359   PLT 231 01/14/2011   PLT 233 11/05/2009 0918   MCV 88.6 03/07/2014 1359   MCV 88.6 01/14/2011   MCV 88 11/05/2009 0918   MCH 28.4 03/07/2014 1359   MCH 28.8 01/14/2011 1530   MCH 29.5 11/05/2009 0918   MCHC 32.1 03/07/2014 1359   MCHC 31.5 11/07/2010 0823   MCHC 33.7 11/05/2009 0918   RDW 13.7 03/07/2014 1359   RDW 13.1 01/14/2011   RDW 12.5 11/05/2009 0918   LYMPHSABS 1.7 03/07/2014 1359   LYMPHSABS 1.3 11/05/2009 0918   LYMPHSABS 2.4 07/05/2008 1450   MONOABS 0.5 03/07/2014 1359   MONOABS 0.4 07/05/2008 1450   EOSABS 0.3 03/07/2014 1359   EOSABS 0.2 11/05/2009 0918   EOSABS 0.3 07/05/2008 1450   BASOSABS 0.1 03/07/2014 1359   BASOSABS 0.0 11/05/2009 0918   BASOSABS 0.0 07/05/2008 1450    Hgb A1C No results found for this basename: HGBA1C         Assessment & Plan:   Fever, chills and body aches due to viral illness:  Symptoms improving Continue fluids, rest, tylenol  RTC as needed or if symptoms persist or worsen

## 2014-09-06 NOTE — Progress Notes (Signed)
Subjective:    Patient ID: Adrienne [name], female    DOB: 1941/04/01, 73 y.o.   MRN: [id number]  HPI  Patient presents with chills, fever and dizziness for the past three days.  She states that she woke up Monday morning drenched in sweat and measured a 102 fever.  She reports resting, lots of fluids and Tylenol with relief.  Her last dose of Tylenol was this morning and she felt feverish at that time, shortly after she felt the fever lift and has not returned and that she is feeling much better.  Denies N/V/diarrhea.  No none sick contacts.  No recent medication changes. She is up-to-date on flu shot.  Review of Systems    Past Medical History  Diagnosis Date  . Lumbago   . Genital herpes, unspecified   . Depressive disorder, not elsewhere classified   . Spinal stenosis, unspecified region other than cervical   . Anemia, unspecified   . Breast cancer     left  . Disorder of bone and cartilage, unspecified   . Macular degeneration   . Bronchitis   . External hemorrhoids   . IBS (irritable bowel syndrome)   . Depression   . Bipolar affective disorder     Family History  Problem Relation Age of Onset  . Pancreatic cancer Father   . Prostate cancer Father   . Alzheimer's disease Father   . Heart attack Father   . Ulcerative colitis Mother   . Breast cancer Mother   . Hypertension Brother   . Hyperlipidemia Brother   . Ulcerative colitis Daughter   . Colon cancer Neg Hx     History   Social History  . Marital Status: Divorced    Spouse Name: N/A    Number of Children: 1  . Years of Education: N/A   Occupational History  . Med tech     Retired  .     Social History Main Topics  . Smoking status: Never Smoker   . Smokeless tobacco: Never Used  . Alcohol Use: 0.0 oz/week    10-14 Glasses of wine per week     Comment: 1 cocktail and 1 glass of wine daily  . Drug Use: No  . Sexual Activity: Not on file   Other Topics Concern  . Not on file   Social  History Narrative   Working at [employer]      Divorced      No regular exercise; decreased because of spinal stenosis   Has MOST FORM completed, full code (reviewed 2014)                Allergies  Allergen Reactions  . Penicillins Anaphylaxis  . Ivp Dye [Iodinated Diagnostic Agents]     CARDIAC ARREST  . Metoclopramide Hcl Anxiety and Rash     Constitutional: Positive headache, fatigue and fever. Denies abrupt weight changes.  HEENT:  Denies rhinorrhea Respiratory: Denies cough. Denies difficulty breathing or shortness of breath.   No other specific complaints in a complete review of systems (except as listed in HPI above).  Objective:  BP 118/68  Pulse 73  Temp(Src) 98.8 F (37.1 C) (Oral)  Wt 146 lb (66.225 kg)  SpO2 98%   General: Appears her stated age, well developed, well nourished in NAD. HEENT:  no sinus tenderness noted; Eyes: sclera white, no icterus, conjunctiva pink Ears: Tm's gray and intact, normal light reflex; Nose: mucosa pink and moist, septum midline; Throat/Mouth: + PND.  Teeth present, mucosa pink and moist, no exudate noted, no lesions or ulcerations noted.  Neck: Neck supple. No lymphadenopathy noted Cardiovascular: Normal rate and rhythm. S1,S2 noted.  No murmur, rubs or gallops noted. No JVD or BLE edema.. Pulmonary/Chest: Normal effort and positive vesicular breath sounds. No respiratory distress. No wheezes, rales or ronchi noted.      Assessment & Plan:   Viral illness   Continue with rest, fluids and Tylenol as needed for pain or fevers.  Please call our office if yur symptoms worsen or do not improve.        Physical Exam        Assessment & Plan:

## 2014-09-08 ENCOUNTER — Encounter: Payer: Self-pay | Admitting: Internal Medicine

## 2014-09-08 ENCOUNTER — Ambulatory Visit (INDEPENDENT_AMBULATORY_CARE_PROVIDER_SITE_OTHER): Payer: Commercial Managed Care - HMO | Admitting: Internal Medicine

## 2014-09-08 VITALS — BP 138/64 | HR 93 | Temp 98.9°F | Wt 147.0 lb

## 2014-09-08 DIAGNOSIS — I803 Phlebitis and thrombophlebitis of lower extremities, unspecified: Secondary | ICD-10-CM

## 2014-09-08 NOTE — Progress Notes (Signed)
Subjective:    Patient ID: Adrienne [name], female    DOB: 1941-01-10, 73 y.o.   MRN: [id number]  HPI  Pt presents to the clinic today with c/o of redness and swelling of the left thigh. She reports this started 2 days ago. The area is slightly warm, but is not painful or tender to touch. She denies fever or chills. She does have a history of varicose veins but they have never become swollen like this one.  Review of Systems      Past Medical History  Diagnosis Date  . Lumbago   . Genital herpes, unspecified   . Depressive disorder, not elsewhere classified   . Spinal stenosis, unspecified region other than cervical   . Anemia, unspecified   . Breast cancer     left  . Disorder of bone and cartilage, unspecified   . Macular degeneration   . Bronchitis   . External hemorrhoids   . IBS (irritable bowel syndrome)   . Depression   . Bipolar affective disorder     Current Outpatient Prescriptions  Medication Sig Dispense Refill  . aspirin 81 MG EC tablet Take 81 mg by mouth daily.        . Calcium Carbonate (CALCIUM 600 PO) Take 600 mg by mouth 2 (two) times daily.        . Calcium Polycarbophil (FIBERCON PO) Take by mouth as needed.      . Cholecalciferol (VITAMIN D) 1000 UNITS capsule Take 1,000 Units by mouth daily.        . fish oil-omega-3 fatty acids 1000 MG capsule Take 1 g by mouth daily.      Marland Kitchen gabapentin (NEURONTIN) 300 MG capsule TAKE 2 CAPSULES TWICE DAILY  360 capsule  3  . Loperamide HCl (IMODIUM A-D PO) Take 1 capsule by mouth 2 (two) times daily.      Marland Kitchen LORazepam (ATIVAN) 1 MG tablet Take 0.5 mg by mouth at bedtime as needed.       . Multiple Vitamins-Minerals (OCUVITE PO) Take 1 tablet by mouth 2 (two) times daily.        . phenelzine (NARDIL) 15 MG tablet Take 30 mg by mouth 2 (two) times daily.       . [DISCONTINUED] Omega-3-acid Ethyl Esters (LOVAZA PO) Take 2,000 mg by mouth 2 (two) times daily in the am and at bedtime..         No current  facility-administered medications for this visit.    Allergies  Allergen Reactions  . Penicillins Anaphylaxis  . Ivp Dye [Iodinated Diagnostic Agents]     CARDIAC ARREST  . Metoclopramide Hcl Anxiety and Rash    Family History  Problem Relation Age of Onset  . Pancreatic cancer Father   . Prostate cancer Father   . Alzheimer's disease Father   . Heart attack Father   . Ulcerative colitis Mother   . Breast cancer Mother   . Hypertension Brother   . Hyperlipidemia Brother   . Ulcerative colitis Daughter   . Colon cancer Neg Hx     History   Social History  . Marital Status: Divorced    Spouse Name: N/A    Number of Children: 1  . Years of Education: N/A   Occupational History  . Med tech     Retired  .     Social History Main Topics  . Smoking status: Never Smoker   . Smokeless tobacco: Never Used  . Alcohol Use: 0.0 oz/week  10-14 Glasses of wine per week     Comment: 1 cocktail and 1 glass of wine daily  . Drug Use: No  . Sexual Activity: Not on file   Other Topics Concern  . Not on file   Social History Narrative   Working at [employer]      Divorced      No regular exercise; decreased because of spinal stenosis   Has MOST FORM completed, full code (reviewed 2014)                 Constitutional: Denies fever, malaise, fatigue, headache or abrupt weight changes.  Skin: Pt reports redness of left inner thigh. Denies rashes, lesions or ulcercations.    No other specific complaints in a complete review of systems (except as listed in HPI above).  Objective:   Physical Exam   BP 138/64  Pulse 93  Temp(Src) 98.9 F (37.2 C) (Oral)  Wt 147 lb (66.679 kg)  SpO2 97% Wt Readings from Last 3 Encounters:  09/08/14 147 lb (66.679 kg)  09/06/14 146 lb (66.225 kg)  07/27/14 147 lb 12 oz (67.019 kg)    General: Appears her stated age, well developed, well nourished in NAD. Skin: Area of erythema noted on left medial thigh, 3 x 3 cm  round. Petechial rash noted around area of erythema. Slightly warm to touch. No pain with palpation. Cardiovascular: Normal rate and rhythm. S1,S2 noted.  No murmur, rubs or gallops noted. Multiple varicose veins noted on BLE. Pulmonary/Chest: Normal effort and positive vesicular breath sounds. No respiratory distress. No wheezes, rales or ronchi noted.   BMET    Component Value Date/Time   NA 142 03/07/2014 1400   NA 141 07/27/2013 1015   NA 141 11/05/2009 0918   K 4.7 03/07/2014 1400   K 4.1 07/27/2013 1015   K 4.4 11/05/2009 0918   CL 103 07/27/2013 1015   CL 103 03/08/2013 1203   CL 98 11/05/2009 0918   CO2 29 03/07/2014 1400   CO2 30 07/27/2013 1015   CO2 30 11/05/2009 0918   GLUCOSE 110 03/07/2014 1400   GLUCOSE 99 07/27/2013 1015   GLUCOSE 101* 03/08/2013 1203   GLUCOSE 119* 11/05/2009 0918   BUN 16.2 03/07/2014 1400   BUN 16 07/27/2013 1015   BUN 15 11/05/2009 0918   CREATININE 0.8 03/07/2014 1400   CREATININE 0.7 07/27/2013 1015   CREATININE 0.6 11/05/2009 0918   CALCIUM 9.9 03/07/2014 1400   CALCIUM 9.5 07/27/2013 1015   CALCIUM 9.3 11/05/2009 0918   GFRNONAA >60 07/05/2008 1450   GFRAA  Value: >60        The eGFR has been calculated using the MDRD equation. This calculation has not been validated in all clinical 07/05/2008 1450    Lipid Panel     Component Value Date/Time   CHOL 232* 07/27/2013 1015   TRIG 114.0 07/27/2013 1015   HDL 74.80 07/27/2013 1015   CHOLHDL 3 07/27/2013 1015   VLDL 22.8 07/27/2013 1015   LDLCALC 113 11/07/2010 0823    CBC    Component Value Date/Time   WBC 5.0 03/07/2014 1359   WBC 4.8 01/14/2011   WBC 3.5* 11/05/2009 0918   RBC 3.84 03/07/2014 1359   RBC 3.68 01/14/2011   RBC 3.60* 11/05/2009 0918   HGB 10.9* 03/07/2014 1359   HGB 10.6 01/14/2011   HGB 10.6* 11/05/2009 0918   HCT 34.0* 03/07/2014 1359   HCT 32.6 01/14/2011   HCT 31.6* 11/05/2009 0960  PLT 267 03/07/2014 1359   PLT 231 01/14/2011   PLT 233 11/05/2009 0918   MCV 88.6 03/07/2014 1359   MCV 88.6  01/14/2011   MCV 88 11/05/2009 0918   MCH 28.4 03/07/2014 1359   MCH 28.8 01/14/2011 1530   MCH 29.5 11/05/2009 0918   MCHC 32.1 03/07/2014 1359   MCHC 31.5 11/07/2010 0823   MCHC 33.7 11/05/2009 0918   RDW 13.7 03/07/2014 1359   RDW 13.1 01/14/2011   RDW 12.5 11/05/2009 0918   LYMPHSABS 1.7 03/07/2014 1359   LYMPHSABS 1.3 11/05/2009 0918   LYMPHSABS 2.4 07/05/2008 1450   MONOABS 0.5 03/07/2014 1359   MONOABS 0.4 07/05/2008 1450   EOSABS 0.3 03/07/2014 1359   EOSABS 0.2 11/05/2009 0918   EOSABS 0.3 07/05/2008 1450   BASOSABS 0.1 03/07/2014 1359   BASOSABS 0.0 11/05/2009 0918   BASOSABS 0.0 07/05/2008 1450    Hgb A1C No results found for this basename: HGBA1C        Assessment & Plan:   Phlebitis of left medial thigh:  Keep the area elevated Cool compresses TID Take some Aleve BID for the inflammation  Should resolve in 3-5 days but if worsens over the weekend, go straight to UC

## 2014-09-08 NOTE — Progress Notes (Signed)
Pre visit review using our clinic review tool, if applicable. No additional management support is needed unless otherwise documented below in the visit note. 

## 2014-09-08 NOTE — Patient Instructions (Signed)
Phlebitis Phlebitis is soreness and swelling (inflammation) of a vein. This can occur in your arms, legs, or torso (trunk), as well as deeper inside your body. Phlebitis is usually not serious when it occurs close to the surface of the body. However, it can cause serious problems when it occurs in a vein deeper inside the body. CAUSES  Phlebitis can be triggered by various things, including:   Reduced blood flow through your veins. This can happen with:  Bed rest over a long period.  Long-distance travel.  Injury.  Surgery.  Being overweight (obese) or pregnant.  Having an IV tube put in the vein and getting certain medicines through the vein.  Cancer and cancer treatment.  Use of illegal drugs taken through the vein.  Inflammatory diseases.  Inherited (genetic) diseases that increase the risk of blood clots.  Hormone therapy, such as birth control pills. SIGNS AND SYMPTOMS   Red, tender, swollen, and painful area on your skin. Usually, the area will be long and narrow.  Firmness along the center of the affected area. This can indicate that a blood clot has formed.  Low-grade fever. DIAGNOSIS  A health care provider can usually diagnose phlebitis by examining the affected area and asking about your symptoms. To check for infection or blood clots, your health care provider may order blood tests or an ultrasound exam of the area. Blood tests and your family history may also indicate if you have an underlying genetic disease that causes blood clots. Occasionally, a piece of tissue is taken from the body (biopsy sample) if an unusual cause of phlebitis is suspected. TREATMENT  Treatment will vary depending on the severity of the condition and the area of the body affected. Treatment may include:  Use of a warm compress or heating pad.  Use of compression stockings or bandages.  Anti-inflammatory medicines.  Removal of any IV tube that may be causing the problem.  Medicines  that kill germs (antibiotics) if an infection is present.  Blood-thinning medicines if a blood clot is suspected or present.  In rare cases, surgery may be needed to remove damaged sections of vein. HOME CARE INSTRUCTIONS   Only take over-the-counter or prescription medicines as directed by your health care provider. Take all medicines exactly as prescribed.  Raise (elevate) the affected area above the level of your heart as directed by your health care provider.  Apply a warm compress or heating pad to the affected area as directed by your health care provider. Do not sleep with the heating pad.  Use compression stockings or bandages as directed. These will speed healing and prevent the condition from coming back.  If you are on blood thinners:  Get follow-up blood tests as directed by your health care provider.  Check with your health care provider before using any new medicines.  Carry a medical alert card or wear your medical alert jewelry to show that you are on blood thinners.  For phlebitis in the legs:  Avoid prolonged standing or bed rest.  Keep your legs moving. Raise your legs when sitting or lying.  Do not smoke.  Women, particularly those over the age of 35, should consider the risks and benefits of taking the contraceptive pill. This kind of hormone treatment can increase your risk for blood clots.  Follow up with your health care provider as directed. SEEK MEDICAL CARE IF:   You have unusual bruising or any bleeding problems.  Your swelling or pain in the affected area   is not improving. °· You are on anti-inflammatory medicine, and you develop belly (abdominal) pain. °SEEK IMMEDIATE MEDICAL CARE IF:  °· You have a sudden onset of chest pain or difficulty breathing. °· You have a fever or persistent symptoms for more than 2-3 days. °· You have a fever and your symptoms suddenly get worse. °MAKE SURE YOU: °· Understand these instructions. °· Will watch your  condition. °· Will get help right away if you are not doing well or get worse. °Document Released: 12/09/2001 Document Revised: 10/05/2013 Document Reviewed: 08/22/2013 °ExitCare® Patient Information ©2015 [employer]. This information is not intended to replace advice given to you by your health care provider. Make sure you discuss any questions you have with your health care provider. ° °

## 2014-09-11 ENCOUNTER — Encounter (HOSPITAL_COMMUNITY): Payer: Self-pay | Admitting: Emergency Medicine

## 2014-09-11 ENCOUNTER — Emergency Department (HOSPITAL_COMMUNITY)
Admission: EM | Admit: 2014-09-11 | Discharge: 2014-09-11 | Disposition: A | Discharge status: Nursing Home (Includes ICF) | Payer: Medicare HMO | Source: Home / Self Care | Attending: Family Medicine | Admitting: Family Medicine

## 2014-09-11 ENCOUNTER — Emergency Department (HOSPITAL_COMMUNITY)
Admission: EM | Admit: 2014-09-11 | Discharge: 2014-09-12 | Disposition: A | Discharge status: Home / Self Care | Payer: Medicare HMO | Attending: Emergency Medicine | Admitting: Emergency Medicine

## 2014-09-11 ENCOUNTER — Emergency Department (HOSPITAL_COMMUNITY): Payer: Medicare HMO

## 2014-09-11 ENCOUNTER — Telehealth: Payer: Self-pay | Admitting: Family Medicine

## 2014-09-11 DIAGNOSIS — R21 Rash and other nonspecific skin eruption: Secondary | ICD-10-CM | POA: Diagnosis not present

## 2014-09-11 DIAGNOSIS — Z8709 Personal history of other diseases of the respiratory system: Secondary | ICD-10-CM | POA: Insufficient documentation

## 2014-09-11 DIAGNOSIS — F319 Bipolar disorder, unspecified: Secondary | ICD-10-CM | POA: Insufficient documentation

## 2014-09-11 DIAGNOSIS — Z8719 Personal history of other diseases of the digestive system: Secondary | ICD-10-CM | POA: Insufficient documentation

## 2014-09-11 DIAGNOSIS — Z880 Allergy status to penicillin: Secondary | ICD-10-CM | POA: Diagnosis not present

## 2014-09-11 DIAGNOSIS — Z862 Personal history of diseases of the blood and blood-forming organs and certain disorders involving the immune mechanism: Secondary | ICD-10-CM | POA: Insufficient documentation

## 2014-09-11 DIAGNOSIS — R509 Fever, unspecified: Secondary | ICD-10-CM

## 2014-09-11 DIAGNOSIS — Z7982 Long term (current) use of aspirin: Secondary | ICD-10-CM | POA: Insufficient documentation

## 2014-09-11 DIAGNOSIS — Z8669 Personal history of other diseases of the nervous system and sense organs: Secondary | ICD-10-CM | POA: Insufficient documentation

## 2014-09-11 DIAGNOSIS — Z8619 Personal history of other infectious and parasitic diseases: Secondary | ICD-10-CM | POA: Diagnosis not present

## 2014-09-11 DIAGNOSIS — Z8739 Personal history of other diseases of the musculoskeletal system and connective tissue: Secondary | ICD-10-CM | POA: Insufficient documentation

## 2014-09-11 DIAGNOSIS — Z79899 Other long term (current) drug therapy: Secondary | ICD-10-CM | POA: Insufficient documentation

## 2014-09-11 DIAGNOSIS — Z853 Personal history of malignant neoplasm of breast: Secondary | ICD-10-CM | POA: Insufficient documentation

## 2014-09-11 DIAGNOSIS — R000 Tachycardia, unspecified: Secondary | ICD-10-CM

## 2014-09-11 LAB — CBC WITH DIFFERENTIAL/PLATELET
Basophils Absolute: 0 10*3/uL (ref 0.0–0.1)
Basophils Relative: 0 % (ref 0–1)
Eosinophils Absolute: 0.1 10*3/uL (ref 0.0–0.7)
Eosinophils Relative: 1 % (ref 0–5)
HCT: 30.7 % — ABNORMAL LOW (ref 36.0–46.0)
Hemoglobin: 10 g/dL — ABNORMAL LOW (ref 12.0–15.0)
Lymphocytes Relative: 39 % (ref 12–46)
Lymphs Abs: 2.2 10*3/uL (ref 0.7–4.0)
MCH: 29.2 pg (ref 26.0–34.0)
MCHC: 32.6 g/dL (ref 30.0–36.0)
MCV: 89.5 fL (ref 78.0–100.0)
MONOS PCT: 6 % (ref 3–12)
Monocytes Absolute: 0.3 10*3/uL (ref 0.1–1.0)
NEUTROS PCT: 54 % (ref 43–77)
Neutro Abs: 3 10*3/uL (ref 1.7–7.7)
PLATELETS: 213 10*3/uL (ref 150–400)
RBC: 3.43 MIL/uL — AB (ref 3.87–5.11)
RDW: 13.6 % (ref 11.5–15.5)
WBC: 5.6 10*3/uL (ref 4.0–10.5)

## 2014-09-11 LAB — URINALYSIS, ROUTINE W REFLEX MICROSCOPIC
Bilirubin Urine: NEGATIVE
GLUCOSE, UA: NEGATIVE mg/dL
Hgb urine dipstick: NEGATIVE
Ketones, ur: 15 mg/dL — AB
Nitrite: NEGATIVE
PROTEIN: NEGATIVE mg/dL
Specific Gravity, Urine: 1.02 (ref 1.005–1.030)
Urobilinogen, UA: 0.2 mg/dL (ref 0.0–1.0)
pH: 7.5 (ref 5.0–8.0)

## 2014-09-11 LAB — COMPREHENSIVE METABOLIC PANEL
ALBUMIN: 3.8 g/dL (ref 3.5–5.2)
ALT: 26 U/L (ref 0–35)
ANION GAP: 13 (ref 5–15)
AST: 43 U/L — ABNORMAL HIGH (ref 0–37)
Alkaline Phosphatase: 78 U/L (ref 39–117)
BUN: 13 mg/dL (ref 6–23)
CALCIUM: 9.3 mg/dL (ref 8.4–10.5)
CO2: 25 mEq/L (ref 19–32)
Chloride: 99 mEq/L (ref 96–112)
Creatinine, Ser: 0.59 mg/dL (ref 0.50–1.10)
GFR calc Af Amer: >90 mL/min (ref >90)
GFR calc non Af Amer: >90 mL/min (ref >90)
Glucose, Bld: 106 mg/dL — ABNORMAL HIGH (ref 70–99)
Potassium: 4.5 mEq/L (ref 3.7–5.3)
SODIUM: 137 meq/L (ref 137–147)
TOTAL PROTEIN: 7.2 g/dL (ref 6.0–8.3)
Total Bilirubin: 0.4 mg/dL (ref 0.3–1.2)

## 2014-09-11 LAB — POCT URINALYSIS DIP (DEVICE)
Bilirubin Urine: NEGATIVE
Glucose, UA: NEGATIVE mg/dL
Ketones, ur: 15 mg/dL — AB
Nitrite: NEGATIVE
PROTEIN: 30 mg/dL — AB
SPECIFIC GRAVITY, URINE: 1.015 (ref 1.005–1.030)
UROBILINOGEN UA: 1 mg/dL (ref 0.0–1.0)
pH: 8.5 — ABNORMAL HIGH (ref 5.0–8.0)

## 2014-09-11 LAB — URINE MICROSCOPIC-ADD ON

## 2014-09-11 MED ORDER — ACETAMINOPHEN 325 MG PO TABS: 325.0000 mg | ORAL_TABLET | Freq: Once | ORAL | Status: DC

## 2014-09-11 MED ORDER — DOXYCYCLINE HYCLATE 100 MG PO CAPS: 100.0000 mg | ORAL_CAPSULE | Freq: Two times a day (BID) | ORAL | Status: DC

## 2014-09-11 MED ORDER — ACETAMINOPHEN 325 MG PO TABS
650.0000 mg | ORAL_TABLET | Freq: Once | ORAL | Status: AC
  Administered 2014-09-11: 650 mg via ORAL

## 2014-09-11 MED ORDER — SODIUM CHLORIDE 0.9 % IV BOLUS (SEPSIS)
1000.0000 mL | Freq: Once | INTRAVENOUS | Status: AC
  Administered 2014-09-11: 1000 mL via INTRAVENOUS

## 2014-09-11 MED ORDER — DOXYCYCLINE HYCLATE 100 MG PO TABS
100.0000 mg | ORAL_TABLET | Freq: Once | ORAL | Status: AC
  Administered 2014-09-11: 100 mg via ORAL
  Filled 2014-09-11: qty 1

## 2014-09-11 NOTE — Telephone Encounter (Signed)
Patient Information:  Caller Name: Vi  Phone: [phone]  Patient: Adrienne [name], Adrienne [name]  Gender: Female  DOB: [birth year]  Age: 73 Years  PCP: Eliezer Lofts (Family Practice)  Office Follow Up:  Does the office need to follow up with this patient?: No  Instructions For The Office: N/A  RN Note:  Localized area with spreading redness on inner thigh with ongoing fever. No drainage. Informed must be seen for antibiotics. Upset that she called so late because she had plans to go kayaking this afternoon.  No appointments remain at [facility] or [facility].  Discussed disposition with Blair/office nurse who approved UC. Advised pt to be seen at [facility].   Symptoms  Reason For Call & Symptoms: Concerned about possible bug bite on left inner thigh for > 1 week.  Seen X 2 week of 09/04/14 for fever;  Diagnosed with phlebitis which she feels is an error. Has 50-cent sized area of redness with papule in center and feels hot.  Crying on phone; feels "beat, exhausted."  "Im just getting weaker."  Does not want to see a NP; wants only an MD to see her.  Asking for antibiotic to be called in.  Reviewed Health History In EMR: Yes  Reviewed Medications In EMR: Yes  Reviewed Allergies In EMR: Yes  Reviewed Surgeries / Procedures: Yes  Date of Onset of Symptoms: 09/04/2014  Treatments Tried: Wearing pressure hose, Tylenol  Treatments Tried Worked: No  Any Fever: Yes  Fever Taken: Oral  Fever Time Of Reading: 08:00:00  Fever Last Reading: 101.1  Guideline(s) Used:  Wound Infection  Disposition Per Guideline:   Go to ED Now (or to Office with PCP Approval)  Reason For Disposition Reached:   Looks infected (spreading redness, red streak, pus) and fever  Advice Given:  Warm Soaks or Local Heat:  If the wound is open, soak it in warm water or put a warm wet cloth on the wound for 20 minutes 3 times per day. Use a warm saltwater solution containing 2 teaspoons of table salt per quart of water.  If the wound is closed, apply a heating pad or warm, moist washcloth to the reddened area for 20 minutes 3 times per day.  Antibiotic Ointment:  Apply an antibiotic ointment 3 times a day. If the area could become dirty, cover with a Band-Aid or a clean gauze dressing.  Pain Medicines:  For pain relief, you can take either acetaminophen, ibuprofen, or naproxen.  Acetaminophen (e.g., Tylenol):  Regular Strength Tylenol: Take 650 mg (two 325 mg pills) by mouth every 4-6 hours as needed. Each Regular Strength Tylenol pill has 325 mg of acetaminophen.  Extra Strength Tylenol: Take 1,000 mg (two 500 mg pills) every 8 hours as needed. Each Extra Strength Tylenol pill has 500 mg of acetaminophen.  The most you should take each day is 3,000 mg (10 Regular Strength or 6 Extra Strength pills a day).  Expected Course:  Pain and swelling normally peak on day 2. Any redness should go away by day 3 or 4. Complete healing should occur by day 10.  Call Back If:   Wound becomes more tender  Redness starts to spread  Pus, drainage, or fever occurs  You become worse  Patient Will Follow Care Advice:  YES

## 2014-09-11 NOTE — ED Notes (Signed)
C/o poss abscess to inner left thigh onset 1 week; getting bigger Also reports fevers, chills and BA x1 week Denies v/n/d, cold sx Alert, no signs of acute distress.

## 2014-09-11 NOTE — ED Provider Notes (Signed)
  Face-to-face evaluation   History: She claims having a fever for one week with aches and chills, despite taking Tylenol. She denies cough, nausea, vomiting, chest pain, weakness, or dizziness. She does rash on her left leg 5 days ago, and does not know what it is. She denies known sick exposure, history of known tick bite or spider bite. She was seen earlier today in urgent care, and sent here for evaluation. She saw her PCP, place, last week for the same problem.  Physical exam: Alert, somewhat anxious, cooperative. Heart-  rate and rhythm, no murmur. Lungs clear to auscultation. Skin, left medial distal thigh has a 1 cm, raised area with small eschar, surrounded by irregular, redness, areas, which are confluent in some areas are punctate. There is no associated fluctuance or proximal streaking.  Medical screening examination/treatment/procedure(s) were conducted as a shared visit with non-physician practitioner(s) and myself.  I personally evaluated the patient during the encounter  Richarda Blade, MD 09/13/14 [phone]

## 2014-09-11 NOTE — ED Notes (Signed)
Pt declined shuttle service; did not want to leave vehicle here.

## 2014-09-11 NOTE — ED Provider Notes (Signed)
Medical screening examination/treatment/procedure(s) were performed by resident physician or non-physician practitioner and as supervising physician I was immediately available for consultation/collaboration.   Pauline Good MD.   Billy Fischer, MD 09/11/14 [phone]

## 2014-09-11 NOTE — ED Notes (Signed)
The pt is c/o of a fever chills  Generalized aches and pains that she has had for a week.  She has been seen several times by her regular doctor and  They have told her she does not have the flu.  She jsut feels miserable.  She wss sent here from [facility] and sent here for  Treatment.

## 2014-09-11 NOTE — ED Notes (Signed)
Pt states that she was bitten by an insect, but she is not sure what kind the week before Labor day, and she is been having chills and fever since Labor day she is been taking Tylenol for it but she still getting the fevers. Pt has a small rash on her left inner leg.

## 2014-09-11 NOTE — Discharge Instructions (Signed)
Please follow with your primary care doctor in the next 2 days for a check-up. They must obtain records for further management.   Do not hesitate to return to the Emergency Department for any new, worsening or concerning symptoms.   Take your antibiotics as directed and to completion. You should never have any leftover antibiotics! Push fluids and stay well hydrated.

## 2014-09-11 NOTE — ED Provider Notes (Signed)
CSN: [id number]     Arrival date & time 09/11/14  1940 History   First MD Initiated Contact with Patient 09/11/14 2224     Chief Complaint  Patient presents with  . Fever     (Consider location/radiation/quality/duration/timing/severity/associated sxs/prior Treatment) HPI  Adrienne [name] is a 73 y.o. female complaining of fever with MAXIMUM TEMPERATURE of 102.0 over the course of 7 days. Associated symptoms of shakes and chills. Patient noticed a rash to the inner right thigh approximately 1.5 weeks ago. Rash is painless, non pruritic, no known trauma. [place] bites but the last one was approximately 3 months ago. Patient denies cough, runny nose, ear pain, sore throat, shortness of breath, chest pain, abdominal pain, nausea, vomiting, change in bowel or bladder habits including dysuria, hematuria, urinary frequency or foul-smelling urine, headache, cervicalgia, decreased by mouth intake, myalgia.    Past Medical History  Diagnosis Date  . Lumbago   . Genital herpes, unspecified   . Depressive disorder, not elsewhere classified   . Spinal stenosis, unspecified region other than cervical   . Anemia, unspecified   . Breast cancer     left  . Disorder of bone and cartilage, unspecified   . Macular degeneration   . Bronchitis   . External hemorrhoids   . IBS (irritable bowel syndrome)   . Depression   . Bipolar affective disorder    Past Surgical History  Procedure Laterality Date  . Abdominal hysterectomy      Total but cervix left  . Breast lumpectomy  6/09    Left; high grade carcinoma in situ  . Rectocele repair    . Mri lumbar  05/2007    L4-L5 spinal stenosis  . Bilateral tuboplasty    . Tonsillectomy    . Dilation and curettage of uterus      x 2   Family History  Problem Relation Age of Onset  . Pancreatic cancer Father   . Prostate cancer Father   . Alzheimer's disease Father   . Heart attack Father   .  Ulcerative colitis Mother   . Breast cancer Mother   . Hypertension Brother   . Hyperlipidemia Brother   . Ulcerative colitis Daughter   . Colon cancer Neg Hx    History  Substance Use Topics  . Smoking status: Never Smoker   . Smokeless tobacco: Never Used  . Alcohol Use: 0.0 oz/week    10-14 Glasses of wine per week     Comment: 1 cocktail and 1 glass of wine daily   OB History   Grav Para Term Preterm Abortions TAB SAB Ect Mult Living                 Review of Systems  10 systems reviewed and found to be negative, except as noted in the HPI.   Allergies  Ivp dye; Penicillins; and Metoclopramide hcl  Home Medications   Prior to Admission medications   Medication Sig Start Date End Date Taking? Authorizing Provider  acetaminophen (TYLENOL) 500 MG tablet Take 1,000 mg by mouth every 6 (six) hours as needed for moderate pain.   Yes Historical Provider, MD  aspirin 81 MG EC tablet Take 81 mg by mouth daily.     Yes Historical Provider, MD  Calcium Carbonate (CALCIUM 600 PO) Take 600 mg by mouth 2 (two) times daily.     Yes Historical Provider, MD  Calcium Polycarbophil (FIBERCON PO)  Take 1 capsule by mouth daily as needed (for fiber support).    Yes Historical Provider, MD  Cholecalciferol (VITAMIN D) 1000 UNITS capsule Take 1,000 Units by mouth daily.     Yes Historical Provider, MD  fish oil-omega-3 fatty acids 1000 MG capsule Take 1 g by mouth daily.   Yes Historical Provider, MD  gabapentin (NEURONTIN) 300 MG capsule Take 600 mg by mouth 2 (two) times daily.   Yes Historical Provider, MD  Loperamide HCl (IMODIUM A-D PO) Take 1 capsule by mouth 2 (two) times daily.   Yes Historical Provider, MD  LORazepam (ATIVAN) 1 MG tablet Take 0.5 mg by mouth at bedtime.    Yes Historical Provider, MD  Multiple Vitamins-Minerals (OCUVITE PO) Take 1 tablet by mouth 2 (two) times daily.    Yes Historical Provider, MD  phenelzine (NARDIL) 15 MG tablet Take 30 mg by mouth 2 (two) times  daily.    Yes Historical Provider, MD  doxycycline (VIBRAMYCIN) 100 MG capsule Take 1 capsule (100 mg total) by mouth 2 (two) times daily. 09/11/14   Analena Gama, PA-C   BP 143/56  Pulse 84  Temp(Src) 97.8 F (36.6 C) (Oral)  Resp 20  Wt 142 lb (64.411 kg)  SpO2 100% Physical Exam  Nursing note and vitals reviewed. Constitutional: She is oriented to person, place, and time. She appears well-developed and well-nourished. No distress.  Well-appearing  HENT:  Head: Normocephalic and atraumatic.  Mouth/Throat: Oropharynx is clear and moist.  Eyes: Conjunctivae and EOM are normal. Pupils are equal, round, and reactive to light.  Neck: Normal range of motion.  Cardiovascular: Normal rate, regular rhythm and intact distal pulses.   Pulmonary/Chest: Effort normal and breath sounds normal. No stridor.  Abdominal: Soft. Bowel sounds are normal.  Musculoskeletal: Normal range of motion.  Neurological: She is alert and oriented to person, place, and time.  Skin: Rash noted.  Erythematous confluence macular lesions to the left inner thigh with central excoriation, non-blanchable.  No lesions to the palms soles or mucous membranes  Psychiatric: She has a normal mood and affect.        ED Course  Procedures (including critical care time) Labs Review Labs Reviewed  URINALYSIS, ROUTINE W REFLEX MICROSCOPIC - Abnormal; Notable for the following:    Color, Urine AMBER (*)    Ketones, ur 15 (*)    Leukocytes, UA SMALL (*)    All other components within normal limits  CBC WITH DIFFERENTIAL - Abnormal; Notable for the following:    RBC 3.43 (*)    Hemoglobin 10.0 (*)    HCT 30.7 (*)    All other components within normal limits  COMPREHENSIVE METABOLIC PANEL - Abnormal; Notable for the following:    Glucose, Bld 106 (*)    AST 43 (*)    All other components within normal limits  URINE MICROSCOPIC-ADD ON - Abnormal; Notable for the following:    Bacteria, UA FEW (*)    Crystals CA  OXALATE CRYSTALS (*)    All other components within normal limits  URINE CULTURE  CULTURE, BLOOD (ROUTINE X 2)  CULTURE, BLOOD (ROUTINE X 2)  LACTIC ACID, PLASMA  B. BURGDORFI ANTIBODIES  LYME DISEASE DNA BY PCR(BORRELIA BURG)  ROCKY MTN SPOTTED FVR AB, IGG-BLOOD  ROCKY MTN SPOTTED FVR AB, IGM-BLOOD    Imaging Review Dg Chest 2 View  09/11/2014   CLINICAL DATA:  Fever.  EXAM: CHEST  2 VIEW  COMPARISON:  Chest radiograph from 07/05/2008  FINDINGS:  The lungs are well-aerated and clear. There is no evidence of focal opacification, pleural effusion or pneumothorax.  The heart is normal in size; the mediastinal contour is within normal limits. No acute osseous abnormalities are seen.  IMPRESSION: No acute cardiopulmonary process seen.   Electronically Signed   By: Garald Balding M.D.   On: 09/11/2014 23:55     EKG Interpretation   Date/Time:  Monday September 11 2014 22:14:29 EDT Ventricular Rate:  74 PR Interval:  177 QRS Duration: 91 QT Interval:  392 QTC Calculation: 435 R Axis:   58 Text Interpretation:  Sinus rhythm since last tracing no significant  change Confirmed by Eulis Foster  MD, ELLIOTT [id number]) on 09/11/2014 10:30:15 PM      MDM   Final diagnoses:  Fever, unspecified fever cause  Rash    Filed Vitals:   09/11/14 2245 09/11/14 2300 09/11/14 2315 09/12/14 0006  BP: 135/61 154/71 143/56   Pulse: 70 73 84   Temp:    97.8 F (36.6 C)  TempSrc:    Oral  Resp: 16 16 20    Weight:      SpO2: 98% 99% 100%     Medications  acetaminophen (TYLENOL) tablet 650 mg (650 mg Oral Given 09/11/14 2001)  sodium chloride 0.9 % bolus 1,000 mL (0 mLs Intravenous Stopped 09/12/14 0006)  doxycycline (VIBRA-TABS) tablet 100 mg (100 mg Oral Given 09/11/14 2251)    STEPHIE XU is a 73 y.o. female sent from urgent care for evaluation of fever of unknown origin and tachycardia. Patient is a well appearing. Patient has not been tachycardic in the ED. Urinalysis is consistent with  infection, however patient has no symptoms. Culture pending. She has no leukocytosis. EKG without abnormality and chest x-ray without infiltrate. Lyme and Rocky mouth and spotted fever testing drawn and patient request. Patient is instructed that her primary care physician will have to request results for management as an outpatient. 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 Blood cultures drawn.  This is a shared visit with the attending physician who personally evaluated the patient and agrees with the care plan.   Evaluation does not show pathology that would require ongoing emergent intervention or inpatient treatment. Pt is hemodynamically stable and mentating appropriately. Discussed findings and plan with patient/guardian, who agrees with care plan. All questions answered. Return precautions discussed and outpatient follow up given.   New Prescriptions   DOXYCYCLINE (VIBRAMYCIN) 100 MG CAPSULE    Take 1 capsule (100 mg total) by mouth 2 (two) times daily.        Monico Blitz, PA-C 09/12/14 [phone]

## 2014-09-11 NOTE — ED Provider Notes (Signed)
CSN: [id number]     Arrival date & time 09/11/14  1707 History   None    Chief Complaint  Patient presents with  . Abscess   (Consider location/radiation/quality/duration/timing/severity/associated sxs/prior Treatment) HPI Comments: 73 year old female presents for evaluation of fever and chills and a lesion on her left upper mid thigh. She has had all of the symptoms for one week. Her fever was first measured as 102F last week on Monday, exactly 7 days ago. She has been to her primary care doctor twice who told her to take Tylenol because she probably had a virus. She has done this but her fever persists. Also she has had a swollen, red, minimally tender lesion on her upper mid left thigh. Her primary care doctor told her that this was phlebitis. She denies any and all other symptoms.  Patient is a 73 y.o. female presenting with abscess.  Abscess Associated symptoms: fever     Past Medical History  Diagnosis Date  . Lumbago   . Genital herpes, unspecified   . Depressive disorder, not elsewhere classified   . Spinal stenosis, unspecified region other than cervical   . Anemia, unspecified   . Breast cancer     left  . Disorder of bone and cartilage, unspecified   . Macular degeneration   . Bronchitis   . External hemorrhoids   . IBS (irritable bowel syndrome)   . Depression   . Bipolar affective disorder    Past Surgical History  Procedure Laterality Date  . Abdominal hysterectomy      Total but cervix left  . Breast lumpectomy  6/09    Left; high grade carcinoma in situ  . Rectocele repair    . Mri lumbar  05/2007    L4-L5 spinal stenosis  . Bilateral tuboplasty    . Tonsillectomy    . Dilation and curettage of uterus      x 2   Family History  Problem Relation Age of Onset  . Pancreatic cancer Father   . Prostate cancer Father   . Alzheimer's disease Father   . Heart attack Father   . Ulcerative colitis Mother   . Breast cancer Mother   . Hypertension Brother    . Hyperlipidemia Brother   . Ulcerative colitis Daughter   . Colon cancer Neg Hx    History  Substance Use Topics  . Smoking status: Never Smoker   . Smokeless tobacco: Never Used  . Alcohol Use: 0.0 oz/week    10-14 Glasses of wine per week     Comment: 1 cocktail and 1 glass of wine daily   OB History   Grav Para Term Preterm Abortions TAB SAB Ect Mult Living                 Review of Systems  Constitutional: Positive for fever and chills.  Skin:       See history of present illness  All other systems reviewed and are negative.   Allergies  Penicillins; Ivp dye; and Metoclopramide hcl  Home Medications   Prior to Admission medications   Medication Sig Start Date End Date Taking? Authorizing Provider  aspirin 81 MG EC tablet Take 81 mg by mouth daily.      Historical Provider, MD  Calcium Carbonate (CALCIUM 600 PO) Take 600 mg by mouth 2 (two) times daily.      Historical Provider, MD  Calcium Polycarbophil (FIBERCON PO) Take by mouth as needed.    Historical Provider, MD  Cholecalciferol (VITAMIN D) 1000 UNITS capsule Take 1,000 Units by mouth daily.      Historical Provider, MD  fish oil-omega-3 fatty acids 1000 MG capsule Take 1 g by mouth daily.    Historical Provider, MD  gabapentin (NEURONTIN) 300 MG capsule TAKE 2 CAPSULES TWICE DAILY 11/22/13   Amy Cletis [place], MD  Loperamide HCl (IMODIUM A-D PO) Take 1 capsule by mouth 2 (two) times daily.    Historical Provider, MD  LORazepam (ATIVAN) 1 MG tablet Take 0.5 mg by mouth at bedtime as needed.     Historical Provider, MD  Multiple Vitamins-Minerals (OCUVITE PO) Take 1 tablet by mouth 2 (two) times daily.      Historical Provider, MD  phenelzine (NARDIL) 15 MG tablet Take 30 mg by mouth 2 (two) times daily.     Historical Provider, MD   BP 119/68  Pulse 110  Temp(Src) 100.1 F (37.8 C) (Oral)  Resp 18  SpO2 96% Physical Exam  Nursing note and vitals reviewed. Constitutional: She is oriented to person, place, and  time. Vital signs are normal. She appears well-developed and well-nourished. No distress.  HENT:  Head: Normocephalic and atraumatic.  Right Ear: External ear normal.  Left Ear: External ear normal.  Nose: Nose normal.  Mouth/Throat: Uvula is midline, oropharynx is clear and moist and mucous membranes are normal. No oropharyngeal exudate.  Eyes: Conjunctivae are normal. Right eye exhibits no discharge. Left eye exhibits no discharge.  Neck: Neck supple. No thyromegaly present.  Cardiovascular: Regular rhythm, normal heart sounds and intact distal pulses.  Tachycardia present.  Exam reveals no gallop and no friction rub.   No murmur heard. Pulmonary/Chest: Effort normal and breath sounds normal. No respiratory distress. She has no wheezes. She has no rales. She exhibits no tenderness.  Abdominal: Soft. There is no tenderness. There is no rebound and no guarding.  Musculoskeletal: Normal range of motion. She exhibits no tenderness.  Lymphadenopathy:    She has no cervical adenopathy.  Neurological: She is alert and oriented to person, place, and time. She has normal strength and normal reflexes. She exhibits normal muscle tone. Coordination normal.  Skin: Skin is warm and dry. Lesion (left upper mid thigh-erythema, mild fluctuance, without tenderness or induration, with surrounding petechia) noted. No rash noted. She is not diaphoretic.  Psychiatric: She has a normal mood and affect. Judgment normal.    ED Course  Procedures (including critical care time) Labs Review Labs Reviewed  POCT URINALYSIS DIP (DEVICE) - Abnormal; Notable for the following:    Ketones, ur 15 (*)    Hgb urine dipstick SMALL (*)    pH 8.5 (*)    Protein, ur 30 (*)    Leukocytes, UA TRACE (*)    All other components within normal limits    Imaging Review No results found.   MDM   1. FUO (fever of unknown origin)   2. Tachycardia    Patient has a measured fever for an entire week, also tachycardic at 110  beats per minute. History of breast cancer. Urine is normal. Physical exam is normal. Needs further evaluation, transferred to the emergency department.       Liam Graham, PA-C 09/11/14 [place] Juanitta Earnhardt, PA-C 09/11/14 1911

## 2014-09-12 ENCOUNTER — Telehealth: Payer: Self-pay

## 2014-09-12 LAB — URINE CULTURE
Colony Count: NO GROWTH
Culture: NO GROWTH

## 2014-09-12 LAB — LACTIC ACID, PLASMA: LACTIC ACID, VENOUS: 0.7 mmol/L (ref 0.5–2.2)

## 2014-09-12 MED ORDER — DOXYCYCLINE HYCLATE 100 MG PO CAPS: 100.0000 mg | ORAL_CAPSULE | Freq: Two times a day (BID) | ORAL | Status: DC

## 2014-09-12 NOTE — Telephone Encounter (Signed)
Okay to wait until Fri for follow up.

## 2014-09-12 NOTE — Telephone Encounter (Signed)
Pt was seen [place] on 09/11/14 and dx with rocky mountain spotted fever; pt is taking doxycycline and supposed to f/u with Dr Diona Browner in 2 days. Next available with Dr Diona Browner is 09/15/14 at 10:30 for 86min appt. Pt wanted Dr Diona Browner to verify OK to wait until Fri for f/u. Advised pt if she did not hear back to keep 09/15/14 appt and if pt condition changes or worsens prior to appt to call office. Pt voiced understanding.

## 2014-09-12 NOTE — Telephone Encounter (Signed)
Adrienne [name] notified that waiting until Friday to see Dr. Diona Browner for ED follow up was fine.  Adrienne [name] states the hospital printed her prescription for her antibiotics and she was going to have to send a friend to pick up the Rx and take it to the pharmacy for her.  She was wondering why the nurse from the hospital just didn't send the Rx electronically to her pharmacy.  Per Dr. Diona Browner, ok to send prescription for Doxycycline electronically to [facility].

## 2014-09-13 LAB — B. BURGDORFI ANTIBODIES: B burgdorferi Ab IgG+IgM: 0.19 {ISR}

## 2014-09-13 LAB — ROCKY MTN SPOTTED FVR AB, IGM-BLOOD: RMSF IGM: 0.17 IV (ref 0.00–0.89)

## 2014-09-13 LAB — ROCKY MTN SPOTTED FVR AB, IGG-BLOOD: RMSF IgG: 1.29 IV — ABNORMAL HIGH

## 2014-09-14 ENCOUNTER — Telehealth (HOSPITAL_COMMUNITY): Payer: Self-pay

## 2014-09-15 ENCOUNTER — Ambulatory Visit (INDEPENDENT_AMBULATORY_CARE_PROVIDER_SITE_OTHER): Payer: Commercial Managed Care - HMO | Admitting: Family Medicine

## 2014-09-15 ENCOUNTER — Encounter: Payer: Self-pay | Admitting: Family Medicine

## 2014-09-15 VITALS — BP 130/60 | HR 90 | Temp 98.3°F | Ht 61.0 in | Wt 145.2 lb

## 2014-09-15 DIAGNOSIS — A779 Spotted fever, unspecified: Secondary | ICD-10-CM

## 2014-09-15 DIAGNOSIS — A770 Spotted fever due to Rickettsia rickettsii: Secondary | ICD-10-CM

## 2014-09-15 NOTE — Progress Notes (Signed)
Pre visit review using our clinic review tool, if applicable. No additional management support is needed unless otherwise documented below in the visit note. 

## 2014-09-15 NOTE — Patient Instructions (Signed)
Push fluids. Complete antibiotics entirely. Continue probiotics. Follow up as needed.

## 2014-09-15 NOTE — Progress Notes (Signed)
   Subjective:    Patient ID: Adrienne [name], female    DOB: May 20, 1941, 73 y.o.   MRN: [id number]  HPI  73 year old female presents for ER follow up .  She was seen at [facility] ER on 9/14 for fever, aches and chills. Rash on left inner leg. She had tick bite in July but pulls ticks off her dog all the time and gardens. She had lab testing performed. Lyme neg, UA neg, RMSF IGG positive,blood cultures neg. Cbc ( nml for her she is chronically low)  She was started on doxycycline 4 days ago.  Since then she feels dramatically better, no fever, no chills, no body aches, rash is resolving.   Review of Systems  Constitutional: Negative for fever and fatigue.  HENT: Negative for ear pain.   Eyes: Negative for pain.  Respiratory: Negative for chest tightness and shortness of breath.   Cardiovascular: Negative for chest pain, palpitations and leg swelling.  Gastrointestinal: Negative for abdominal pain.  Genitourinary: Negative for dysuria.       Objective:   Physical Exam  Constitutional: Vital signs are normal. She appears well-developed and well-nourished. She is cooperative.  Non-toxic appearance. She does not appear ill. No distress.  HENT:  Head: Normocephalic.  Right Ear: Hearing, tympanic membrane, external ear and ear canal normal. Tympanic membrane is not erythematous, not retracted and not bulging.  Left Ear: Hearing, tympanic membrane, external ear and ear canal normal. Tympanic membrane is not erythematous, not retracted and not bulging.  Nose: No mucosal edema or rhinorrhea. Right sinus exhibits no maxillary sinus tenderness and no frontal sinus tenderness. Left sinus exhibits no maxillary sinus tenderness and no frontal sinus tenderness.  Mouth/Throat: Uvula is midline, oropharynx is clear and moist and mucous membranes are normal.  Eyes: Conjunctivae, EOM and lids are normal. Pupils are equal, round, and reactive to light. Lids are everted and swept, no foreign bodies found.    Neck: Trachea normal and normal range of motion. Neck supple. Carotid bruit is not present. No mass and no thyromegaly present.  Cardiovascular: Normal rate, regular rhythm, S1 normal, S2 normal, normal heart sounds, intact distal pulses and normal pulses.  Exam reveals no gallop and no friction rub.   No murmur heard. Pulmonary/Chest: Effort normal and breath sounds normal. Not tachypneic. No respiratory distress. She has no decreased breath sounds. She has no wheezes. She has no rhonchi. She has no rales.  Abdominal: Soft. Normal appearance and bowel sounds are normal. There is no tenderness.  Neurological: She is alert.  Skin: Skin is warm, dry and intact. Rash noted.     Hyperpigmentation on left inner thigh, no warmth.   Psychiatric: Her speech is normal and behavior is normal. Judgment and thought content normal. Her mood appears not anxious. Cognition and memory are normal. She does not exhibit a depressed mood.          Assessment & Plan:

## 2014-09-15 NOTE — Assessment & Plan Note (Signed)
Resolving on antibiotics.

## 2014-09-18 LAB — CULTURE, BLOOD (ROUTINE X 2)
CULTURE: NO GROWTH
Culture: NO GROWTH

## 2014-09-26 ENCOUNTER — Telehealth: Payer: Self-pay

## 2014-09-26 NOTE — Telephone Encounter (Signed)
Pt left v/m; pt will get flu shot and pt is getting over RMSP and pt wants to know if after effect can be tremor to fingers.ot  Finished antibiotic. Pt request cb.

## 2014-09-28 ENCOUNTER — Telehealth: Payer: Self-pay | Admitting: Family Medicine

## 2014-09-28 ENCOUNTER — Ambulatory Visit (INDEPENDENT_AMBULATORY_CARE_PROVIDER_SITE_OTHER): Payer: Commercial Managed Care - HMO | Admitting: Internal Medicine

## 2014-09-28 ENCOUNTER — Encounter: Payer: Self-pay | Admitting: Internal Medicine

## 2014-09-28 VITALS — BP 118/64 | HR 79 | Temp 98.9°F | Resp 14 | Wt 145.5 lb

## 2014-09-28 DIAGNOSIS — Z23 Encounter for immunization: Secondary | ICD-10-CM

## 2014-09-28 DIAGNOSIS — R251 Tremor, unspecified: Secondary | ICD-10-CM | POA: Insufficient documentation

## 2014-09-28 NOTE — Assessment & Plan Note (Signed)
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

## 2014-09-28 NOTE — Addendum Note (Signed)
Addended by: Carter Kitten on: 09/28/2014 05:27 PM   Modules accepted: Orders

## 2014-09-28 NOTE — Progress Notes (Signed)
Subjective:    Patient ID: Adrienne [name], female    DOB: [birth year], 73 y.o.   MRN: [id number]  HPI Reviewed her illness course Finished the doxycycline  Having trouble with printing letters Easier to do cursive Very fine tremor No problems with any other ADLs--- like brushing teeth and hair, eating, etc  No headache Feels a little confused about days of the week now--but is "very busy" No problems with her voluminous reading No weakness No diplopia or unilateral vision loss No facial droop  Current Outpatient Prescriptions on File Prior to Visit  Medication Sig Dispense Refill  . acetaminophen (TYLENOL) 500 MG tablet Take 1,000 mg by mouth every 6 (six) hours as needed for moderate pain.      Marland Kitchen aspirin 81 MG EC tablet Take 81 mg by mouth daily.        . Calcium Carbonate (CALCIUM 600 PO) Take 600 mg by mouth 2 (two) times daily.        . Calcium Polycarbophil (FIBERCON PO) Take 1 capsule by mouth daily as needed (for fiber support).       . Cholecalciferol (VITAMIN D) 1000 UNITS capsule Take 1,000 Units by mouth daily.        . fish oil-omega-3 fatty acids 1000 MG capsule Take 1 g by mouth daily.      Marland Kitchen gabapentin (NEURONTIN) 300 MG capsule Take 600 mg by mouth 2 (two) times daily.      . Loperamide HCl (IMODIUM A-D PO) Take 1 capsule by mouth 2 (two) times daily.      Marland Kitchen LORazepam (ATIVAN) 1 MG tablet Take 0.5 mg by mouth at bedtime.       . Multiple Vitamins-Minerals (OCUVITE PO) Take 1 tablet by mouth 2 (two) times daily.       . phenelzine (NARDIL) 15 MG tablet Take 30 mg by mouth 2 (two) times daily.       . [DISCONTINUED] Omega-3-acid Ethyl Esters (LOVAZA PO) Take 2,000 mg by mouth 2 (two) times daily in the am and at bedtime..         No current facility-administered medications on file prior to visit.    Allergies  Allergen Reactions  . Ivp Dye [Iodinated Diagnostic Agents] Other (See Comments)    CARDIAC ARREST  . Penicillins Anaphylaxis  . Metoclopramide Hcl  Anxiety and Rash    Past Medical History  Diagnosis Date  . Lumbago   . Genital herpes, unspecified   . Depressive disorder, not elsewhere classified   . Spinal stenosis, unspecified region other than cervical   . Anemia, unspecified   . Breast cancer     left  . Disorder of bone and cartilage, unspecified   . Macular degeneration   . Bronchitis   . External hemorrhoids   . IBS (irritable bowel syndrome)   . Depression   . Bipolar affective disorder     Past Surgical History  Procedure Laterality Date  . Abdominal hysterectomy      Total but cervix left  . Breast lumpectomy  6/09    Left; high grade carcinoma in situ  . Rectocele repair    . Mri lumbar  05/2007    L4-L5 spinal stenosis  . Bilateral tuboplasty    . Tonsillectomy    . Dilation and curettage of uterus      x 2    Family History  Problem Relation Age of Onset  . Pancreatic cancer Father   . Prostate cancer Father   .  Alzheimer's disease Father   . Heart attack Father   . Ulcerative colitis Mother   . Breast cancer Mother   . Hypertension Brother   . Hyperlipidemia Brother   . Ulcerative colitis Daughter   . Colon cancer Neg Hx     History   Social History  . Marital Status: Divorced    Spouse Name: N/A    Number of Children: 1  . Years of Education: N/A   Occupational History  . Med tech     Retired  .     Social History Main Topics  . Smoking status: Never Smoker   . Smokeless tobacco: Never Used  . Alcohol Use: 0.0 oz/week    10-14 Glasses of wine per week     Comment: 1 cocktail and 1 glass of wine daily  . Drug Use: No  . Sexual Activity: Not on file   Other Topics Concern  . Not on file   Social History Narrative   Working at [employer]      Divorced      No regular exercise; decreased because of spinal stenosis   Has MOST FORM completed, full code (reviewed 2014)               Review of Systems Not sleeping quite as well as usual Good appetite  No  stomach troubles on the antibiotic No syncope    Objective:   Physical Exam  Constitutional: She appears well-developed and well-nourished. No distress.  Eyes: Conjunctivae and EOM are normal. Pupils are equal, round, and reactive to light.  Neck: Normal range of motion. Neck supple. No thyromegaly present.  Lymphadenopathy:    She has no cervical adenopathy.  Neurological: She has normal strength. She displays no atrophy. No sensory deficit. She exhibits normal muscle tone. She displays a negative Romberg sign. Coordination and gait normal.  Does have faint tremor at rest in hands and head. Somewhat coarse movements with finger to nose and other active movements.          Assessment & Plan:

## 2014-09-28 NOTE — Telephone Encounter (Signed)
Patient Information:  Caller Name: Adrienne [name]  Phone: [phone]  Patient: Adrienne [name], Adrienne [name]  Gender: Female  DOB: 1941-06-25  Age: 73 Years  PCP: Eliezer Lofts [facility])  Office Follow Up:  Does the office need to follow up with this patient?: Yes  Instructions For The Office: Dr. Diona Browner is not in office. Declined to be seen by NP. Scheduled with Dr. Silvio Pate.  Post RMSF treatment. Has developed minor tremor in hands and legs. Medication treatment completed. Please review.  Scheduled 09/28/14 with Dr. Silvio Pate at 16:30 pm  RN Note:  Home care advice and call back parameters reviewed. Appt scheduled with Dr. Silvio Pate 16:30 pm. Declined to schedule with NP.  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  She has completed all medication and was followed up in the office 09/15/2014 with Dr. Diona Browner.  Patient states she has developed a minor tremor in bilateral hands and legs on 09/25/14. She reports that it "is not noticeable".  She is concerned . No rash , no fever. There have been no changes in walking , talking or speech. Voice is clear. [id number].  Reviewed Health History In EMR: Yes  Reviewed Medications In EMR: Yes  Reviewed Allergies In EMR: Yes  Reviewed Surgeries / Procedures: Yes  Date of Onset of Symptoms: 09/25/2014  Guideline(s) Used:  Tick Bite  Neurologic Deficit  Disposition Per Guideline:   See Today in Office  Reason For Disposition Reached:   Patient wants to be seen  Advice Given:  Call Back If:  Symptoms do not go away within 30 minutes  You become worse.  Patient Will Follow Care Advice:  YES  Appointment Scheduled:  09/28/2014 16:30:00 Appointment Scheduled Provider:  [name] [facility])

## 2014-09-28 NOTE — Patient Instructions (Signed)
If your tremor and mild thinking problems don't go away in the next 2 weeks or so, please let me know (and I will set you up with a neurologist).

## 2014-09-28 NOTE — Telephone Encounter (Signed)
FYI: patient on schedule to be seen today at 4:30pm

## 2014-09-29 NOTE — Telephone Encounter (Signed)
Let pt know that RMSF can cause focal neuro symptoms but I have not seen tremors before, but it is possible. Should resolved with time.

## 2014-09-29 NOTE — Telephone Encounter (Signed)
Patient came in yesterday and was seen by Dr. Silvio Pate about tremor.  See office note.

## 2014-09-29 NOTE — Telephone Encounter (Signed)
Please ignore the previosu note. I see she has seen Dr. Silvio Pate. Let her know I agree with his recommendations.

## 2014-09-29 NOTE — Telephone Encounter (Signed)
Adrienne [name] notified as instructed by telephone.  She would like to change PCP to Dr. Silvio Pate.  She felt like she really connected with him yesterday. Please advise.

## 2014-09-29 NOTE — Telephone Encounter (Signed)
No problem, she is a very nice lady.

## 2014-09-30 NOTE — Telephone Encounter (Signed)
Okay for her to change to me if she desires

## 2014-10-02 NOTE — Telephone Encounter (Signed)
Left message for Keyle that PCP has been changed to Dr. Silvio Pate.

## 2014-10-30 ENCOUNTER — Telehealth: Payer: Self-pay | Admitting: Internal Medicine

## 2014-10-30 NOTE — Telephone Encounter (Signed)
I had told her if her symptoms didn't improve that I would recommend her seeing a neurologist. Find out if she is willing to go and I will put in referral

## 2014-10-30 NOTE — Telephone Encounter (Signed)
Pt called and is having tremor in the right hand, pinkie on same hand is cold to the touch.  She is also having difficulty writing.  Please advise.  Best number to call pt is [phone]

## 2014-10-31 NOTE — Telephone Encounter (Signed)
.  left message to have patient return my call. Left detailed message on VM asking pt to return my call

## 2014-11-17 ENCOUNTER — Ambulatory Visit (INDEPENDENT_AMBULATORY_CARE_PROVIDER_SITE_OTHER): Payer: Commercial Managed Care - HMO | Admitting: Internal Medicine

## 2014-11-17 ENCOUNTER — Encounter (INDEPENDENT_AMBULATORY_CARE_PROVIDER_SITE_OTHER): Payer: Self-pay

## 2014-11-17 ENCOUNTER — Encounter: Payer: Self-pay | Admitting: Internal Medicine

## 2014-11-17 VITALS — BP 130/80 | HR 90 | Temp 97.8°F | Wt 149.0 lb

## 2014-11-17 DIAGNOSIS — R251 Tremor, unspecified: Secondary | ICD-10-CM

## 2014-11-17 NOTE — Progress Notes (Signed)
Subjective:    Patient ID: Adrienne [name], female    DOB: [birth year], 73 y.o.   MRN: [id number]  HPI Still having trouble with tremor Both hands but much more in right hand Very hard to write Only started since the RMSF Can't spread fingers in right hand 5th finger especially affected  Walks fine No slowing in movement No stiffness  Current Outpatient Prescriptions on File Prior to Visit  Medication Sig Dispense Refill  . acetaminophen (TYLENOL) 500 MG tablet Take 1,000 mg by mouth every 6 (six) hours as needed for moderate pain.    Marland Kitchen aspirin 81 MG EC tablet Take 81 mg by mouth daily.      . Calcium Carbonate (CALCIUM 600 PO) Take 600 mg by mouth 2 (two) times daily.      . Calcium Polycarbophil (FIBERCON PO) Take 1 capsule by mouth daily as needed (for fiber support).     . Cholecalciferol (VITAMIN D) 1000 UNITS capsule Take 1,000 Units by mouth daily.      . fish oil-omega-3 fatty acids 1000 MG capsule Take 1 g by mouth daily.    Marland Kitchen gabapentin (NEURONTIN) 300 MG capsule Take 600 mg by mouth 2 (two) times daily.    . Loperamide HCl (IMODIUM A-D PO) Take 1 capsule by mouth 2 (two) times daily.    Marland Kitchen LORazepam (ATIVAN) 1 MG tablet Take 0.5 mg by mouth at bedtime.     . Multiple Vitamins-Minerals (OCUVITE PO) Take 1 tablet by mouth 2 (two) times daily.     . phenelzine (NARDIL) 15 MG tablet Take 30 mg by mouth 2 (two) times daily.     . Probiotic Product (PROBIOTIC DAILY) CAPS Take 1 capsule by mouth daily.    . [DISCONTINUED] Omega-3-acid Ethyl Esters (LOVAZA PO) Take 2,000 mg by mouth 2 (two) times daily in the am and at bedtime..       No current facility-administered medications on file prior to visit.    Allergies  Allergen Reactions  . Ivp Dye [Iodinated Diagnostic Agents] Other (See Comments)    CARDIAC ARREST  . Penicillins Anaphylaxis  . Metoclopramide Hcl Anxiety and Rash    Past Medical History  Diagnosis Date  . Lumbago   . Genital herpes, unspecified   .  Depressive disorder, not elsewhere classified   . Spinal stenosis, unspecified region other than cervical   . Anemia, unspecified   . Breast cancer     left  . Disorder of bone and cartilage, unspecified   . Macular degeneration   . Bronchitis   . External hemorrhoids   . IBS (irritable bowel syndrome)   . Depression   . Bipolar affective disorder     Past Surgical History  Procedure Laterality Date  . Abdominal hysterectomy      Total but cervix left  . Breast lumpectomy  6/09    Left; high grade carcinoma in situ  . Rectocele repair    . Mri lumbar  05/2007    L4-L5 spinal stenosis  . Bilateral tuboplasty    . Tonsillectomy    . Dilation and curettage of uterus      x 2    Family History  Problem Relation Age of Onset  . Pancreatic cancer Father   . Prostate cancer Father   . Alzheimer's disease Father   . Heart attack Father   . Ulcerative colitis Mother   . Breast cancer Mother   . Hypertension Brother   . Hyperlipidemia Brother   .  Ulcerative colitis Daughter   . Colon cancer Neg Hx     History   Social History  . Marital Status: Divorced    Spouse Name: N/A    Number of Children: 1  . Years of Education: N/A   Occupational History  . Med tech     Retired  .     Social History Main Topics  . Smoking status: Never Smoker   . Smokeless tobacco: Never Used  . Alcohol Use: 0.0 oz/week    10-14 Glasses of wine per week     Comment: 1 cocktail and 1 glass of wine daily  . Drug Use: No  . Sexual Activity: Not on file   Other Topics Concern  . Not on file   Social History Narrative   Working at [employer] center      Divorced      No regular exercise; decreased because of spinal stenosis   Has MOST FORM completed, full code (reviewed 2014)               Review of Systems Still with pain in left buttock--relates to fall. Hard getting to sleep Does eventually sleep okay and is rested in AM    Objective:   Physical Exam  Constitutional:  She appears well-developed and well-nourished. No distress.  Neurological:  Gait is normal No shuffling No focal weakness Fine tremor in both hands---mild overshoot with finger to nose with both hands No increased tone No bradykinesia          Assessment & Plan:

## 2014-11-17 NOTE — Progress Notes (Signed)
Pre visit review using our clinic review tool, if applicable. No additional management support is needed unless otherwise documented below in the visit note. 

## 2014-11-17 NOTE — Assessment & Plan Note (Signed)
Tremors and functional issues are really noticeable Only started after the RMSF Doesn't seem to be Parkinsonism Will set up eval with Dr Tat

## 2014-11-30 ENCOUNTER — Ambulatory Visit (INDEPENDENT_AMBULATORY_CARE_PROVIDER_SITE_OTHER): Payer: Commercial Managed Care - HMO | Admitting: Neurology

## 2014-11-30 ENCOUNTER — Encounter: Payer: Self-pay | Admitting: Neurology

## 2014-11-30 VITALS — BP 116/68 | HR 72 | Ht 61.0 in | Wt 147.0 lb

## 2014-11-30 DIAGNOSIS — G2401 Drug induced subacute dyskinesia: Secondary | ICD-10-CM

## 2014-11-30 DIAGNOSIS — G255 Other chorea: Secondary | ICD-10-CM

## 2014-11-30 LAB — COMPREHENSIVE METABOLIC PANEL
ALK PHOS: 55 U/L (ref 39–117)
ALT: 13 U/L (ref 0–35)
AST: 22 U/L (ref 0–37)
Albumin: 4 g/dL (ref 3.5–5.2)
BILIRUBIN TOTAL: 0.2 mg/dL (ref 0.2–1.2)
BUN: 16 mg/dL (ref 6–23)
CO2: 31 mEq/L (ref 19–32)
Calcium: 9.6 mg/dL (ref 8.4–10.5)
Chloride: 104 mEq/L (ref 96–112)
Creat: 0.61 mg/dL (ref 0.50–1.10)
Glucose, Bld: 77 mg/dL (ref 70–99)
Potassium: 4.4 mEq/L (ref 3.5–5.3)
SODIUM: 142 meq/L (ref 135–145)
TOTAL PROTEIN: 6.6 g/dL (ref 6.0–8.3)

## 2014-11-30 LAB — CBC WITH DIFFERENTIAL/PLATELET
BASOS PCT: 1 % (ref 0–1)
Basophils Absolute: 0 10*3/uL (ref 0.0–0.1)
Eosinophils Absolute: 0.2 10*3/uL (ref 0.0–0.7)
Eosinophils Relative: 5 % (ref 0–5)
HCT: 32 % — ABNORMAL LOW (ref 36.0–46.0)
HEMOGLOBIN: 10.7 g/dL — AB (ref 12.0–15.0)
Lymphocytes Relative: 31 % (ref 12–46)
Lymphs Abs: 1.1 10*3/uL (ref 0.7–4.0)
MCH: 28.3 pg (ref 26.0–34.0)
MCHC: 33.4 g/dL (ref 30.0–36.0)
MCV: 84.7 fL (ref 78.0–100.0)
MPV: 9.2 fL — AB (ref 9.4–12.4)
Monocytes Absolute: 0.3 10*3/uL (ref 0.1–1.0)
Monocytes Relative: 8 % (ref 3–12)
NEUTROS PCT: 55 % (ref 43–77)
Neutro Abs: 2 10*3/uL (ref 1.7–7.7)
Platelets: 254 10*3/uL (ref 150–400)
RBC: 3.78 MIL/uL — ABNORMAL LOW (ref 3.87–5.11)
RDW: 14.5 % (ref 11.5–15.5)
WBC: 3.6 10*3/uL — ABNORMAL LOW (ref 4.0–10.5)

## 2014-11-30 LAB — HEPATIC FUNCTION PANEL
ALBUMIN: 4 g/dL (ref 3.5–5.2)
ALT: 13 U/L (ref 0–35)
AST: 22 U/L (ref 0–37)
Alkaline Phosphatase: 55 U/L (ref 39–117)
BILIRUBIN TOTAL: 0.2 mg/dL (ref 0.2–1.2)
Bilirubin, Direct: 0.1 mg/dL (ref 0.0–0.3)
Indirect Bilirubin: 0.1 mg/dL — ABNORMAL LOW (ref 0.2–1.2)
Total Protein: 6.6 g/dL (ref 6.0–8.3)

## 2014-11-30 LAB — SEDIMENTATION RATE: SED RATE: 20 mm/h (ref 0–22)

## 2014-11-30 LAB — VITAMIN B12: Vitamin B-12: 587 pg/mL (ref 211–911)

## 2014-11-30 LAB — RPR

## 2014-11-30 LAB — FERRITIN: FERRITIN: 75 ng/mL (ref 10–291)

## 2014-11-30 LAB — TSH: TSH: 1.645 u[IU]/mL (ref 0.350–4.500)

## 2014-11-30 NOTE — Progress Notes (Signed)
Note faxed to Dr Casimiro Needle at [phone] with confirmation received.

## 2014-11-30 NOTE — Progress Notes (Signed)
Subjective:   Adrienne [name] was seen in consultation in the movement disorder clinic at the request of [name], MD.  The evaluation is for tremor.  The patient is a 73 y.o.  female who believes that tremor started after the diagnosis of [place] spotted fever in september.  I did review those records as well as her labs.  Her IgG was positive for [facility] spotted fever, but the IgM was within normal limits.  Pt reports that tremor is better; pt. Reports that tremor is almost gone and is 70-80: better.  Pt states that her biggest issue is that her right hand isn't working well.  She is having trouble abducting the fingers on the right hand.  She plays the piano and she cannot stretch her right hand like she did.  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  She has a slight ache in the hand.  There is paresthesias throughout all of the fingers on the right.  No neck pain.  Walking well.   No issues with swallowing.   Pt is on nardil 30 mg bid and has been on this medication for 20 + years and "I will be on this for the rest of my life."  States that this is only drug that has worked.  Pt states that "I eat whatever I want and can still take that medication."  She is on gabapentin but states that she cannot remember why she is on the drug and has been on it for years.  She takes ativan only to sleep at night.     Outside reports reviewed: historical medical records, office notes and referral letter/letters.  Allergies  Allergen Reactions  . Ivp Dye [Iodinated Diagnostic Agents] Other (See Comments)    CARDIAC ARREST  . Penicillins Anaphylaxis  . Metoclopramide Hcl Anxiety and Rash    Outpatient Encounter Prescriptions as of 11/30/2014  Medication Sig  . acetaminophen (TYLENOL) 500 MG tablet Take 1,000 mg by mouth every 6 (six) hours as needed for moderate pain.  Marland Kitchen aspirin 81 MG EC tablet Take 81 mg by mouth daily.    .  Calcium Carbonate (CALCIUM 600 PO) Take 600 mg by mouth 2 (two) times daily.    . Calcium Polycarbophil (FIBERCON PO) Take 1 capsule by mouth daily as needed (for fiber support).   . Cholecalciferol (VITAMIN D) 1000 UNITS capsule Take 1,000 Units by mouth daily.    . fish oil-omega-3 fatty acids 1000 MG capsule Take 1 g by mouth daily.  Marland Kitchen gabapentin (NEURONTIN) 300 MG capsule Take 600 mg by mouth 2 (two) times daily.  . Loperamide HCl (IMODIUM A-D PO) Take 1 capsule by mouth 2 (two) times daily.  Marland Kitchen LORazepam (ATIVAN) 1 MG tablet Take 0.5 mg by mouth at bedtime.   . Multiple Vitamins-Minerals (OCUVITE PO) Take 1 tablet by mouth 2 (two) times daily.   . phenelzine (NARDIL) 15 MG tablet Take 30 mg by mouth 2 (two) times daily.   . Probiotic Product (PROBIOTIC DAILY) CAPS Take 1 capsule by mouth daily.    Past Medical History  Diagnosis Date  . Lumbago   . Genital herpes, unspecified   . Depressive disorder, not elsewhere classified   . Spinal stenosis, unspecified region other than cervical   . Anemia, unspecified   . Breast cancer     left  . Disorder of bone and cartilage, unspecified   .  Macular degeneration   . Bronchitis   . External hemorrhoids   . IBS (irritable bowel syndrome)   . Depression   . Bipolar affective disorder     Past Surgical History  Procedure Laterality Date  . Abdominal hysterectomy      Total but cervix left  . Breast lumpectomy Left 6/09    Left; high grade carcinoma in situ  . Rectocele repair    . Mri lumbar  05/2007    L4-L5 spinal stenosis  . Bilateral tuboplasty    . Tonsillectomy    . Dilation and curettage of uterus      x 2    History   Social History  . Marital Status: Divorced    Spouse Name: N/A    Number of Children: 1  . Years of Education: N/A   Occupational History  . Med tech     Retired  .     Social History Main Topics  . Smoking status: Never Smoker   . Smokeless tobacco: Never Used  . Alcohol Use: 0.0 oz/week     10-14 Glasses of wine per week     Comment: 1 cocktail (manhattan) and 1 glass of wine daily  . Drug Use: No  . Sexual Activity: Not on file   Other Topics Concern  . Not on file   Social History Narrative   Working at [employer] center      Divorced      No regular exercise; decreased because of spinal stenosis   Has MOST FORM completed, full code (reviewed 2014)                Family Status  Relation Status Death Age  . Father Deceased 71    MI, pancreatic, prostate cancer, alzheimer's  . Mother Deceased 52    breast cancer, "natural causes"  . Brother Alive     HTN  . Daughter Alive     ulcerative colitis    Review of Systems A complete 10 system ROS was obtained and was negative apart from what is mentioned.   Objective:   VITALS:   Filed Vitals:   11/30/14 0914  BP: 116/68  Pulse: 72  Height: 5\' 1"  (1.549 m)  Weight: 147 lb (66.679 kg)   Gen:  Appears stated age and in NAD. HEENT:  Normocephalic, atraumatic. The mucous membranes are moist. The superficial temporal arteries are without ropiness or tenderness. Cardiovascular: Regular rate and rhythm. Lungs: Clear to auscultation bilaterally. Neck: There are no carotid bruits noted bilaterally.  NEUROLOGICAL:  Orientation:  The patient is alert and oriented x 3.  Recent and remote memory are intact.  Attention span and concentration are normal.  Able to name objects and repeat without trouble.  Fund of knowledge is appropriate Cranial nerves: There is good facial symmetry. The pupils are equal round and reactive to light bilaterally. Fundoscopic exam reveals clear disc margins bilaterally. Extraocular muscles are intact and visual fields are full to confrontational testing. Speech is fluent and clear. Soft palate rises symmetrically and there is no tongue deviation. Hearing is intact to conversational tone. Tone: Tone is good throughout. Sensation: Sensation is intact to light touch and pinprick throughout  (facial, trunk, extremities). Vibration is intact at the bilateral big toe. There is no extinction with double simultaneous stimulation. There is no sensory dermatomal level identified. Coordination:  The patient has no dysdiadichokinesia or dysmetria. Motor: Strength is 5/5 in the bilateral upper and lower extremities.  Strength in the  finger abductors was good bilaterally.   Shoulder shrug is equal bilaterally.  There is no pronator drift.  There are no fasciculations noted. DTR's: Deep tendon reflexes are 2/4 at the bilateral biceps, triceps, brachioradialis, patella and achilles.  Plantar responses are downgoing bilaterally. Gait and Station: The patient is able to ambulate without difficulty. The patient is able to heel toe walk without any difficulty. The patient is able to ambulate in a tandem fashion. The patient is able to stand in the Romberg position.   MOVEMENT EXAM: Tremor:  There is choreiform like movements of the hands, legs and more rarely, mouth (not tongue)  LABS ordered today: CBC, CMP, ceruloplasmin, copper, LFTs, TSH, PTH, ferritin, sedimentation rate, ANA, antiphospholipid antibody, lupus anticoagulant, RPR, B12, antigliadin antibody, Athena labs: HD labs     Assessment/Plan:   1.  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  In fact, her IgM on RMSF serologies was negative and only IgG was positive.  She does state that tremor is better but she has choreiform movements of the arms, legs and oral dyskinesias (that I am not sure she really even notices).  I talked to her about my worry that this is from the nardil, which has been shown to cause tardive dyskinesia, although rarely.  I talked to Dr. Casimiro Needle about her and neither the patient nor Dr. Casimiro Needle think that getting off this medication is a realistic goal or expectation.  I will do labs to look at other causes.   She refused the HD panel today.    -Will do an MRI brain to make sure not missing anything there  -f/u after above completed.  Pt has appt with Dr. Casimiro Needle next week.  Appreciate his input.  Greater than 50% of 60 min visit in counseling and coordinating care.

## 2014-11-30 NOTE — Patient Instructions (Signed)
1. Your provider has requested that you have labwork completed today. Please go to [facility] on the first floor of this building before leaving the office today. **Chesley Noon will contact you to set up home lab test 2. We have scheduled you at [place] for your MRI on 12/09/14 at 10:00 am. Please arrive 30 minutes prior and go to [place]. If you need to change this appt please call [phone].

## 2014-12-01 LAB — LUPUS ANTICOAGULANT PANEL
DRVVT: 35.7 s (ref <42.9)
LUPUS ANTICOAGULANT: NOT DETECTED
PTT Lupus Anticoagulant: 37.1 secs (ref 28.0–43.0)

## 2014-12-01 LAB — GLIADIN ANTIBODIES, SERUM
Gliadin IgA: 4 U/mL (ref <20)
Gliadin IgG: 3 U/mL (ref <20)

## 2014-12-01 LAB — ANA: ANA: NEGATIVE

## 2014-12-01 LAB — PARATHYROID HORMONE, INTACT (NO CA): PTH: 25 pg/mL (ref 14–64)

## 2014-12-02 LAB — CARDIOLIPIN ANTIBODY: Phospholipids: 285 mg/dL — ABNORMAL HIGH (ref 151–264)

## 2014-12-02 LAB — COPPER, SERUM: Copper: 116 ug/dL (ref 70–175)

## 2014-12-04 ENCOUNTER — Telehealth: Payer: Self-pay | Admitting: Neurology

## 2014-12-04 LAB — CERULOPLASMIN: Ceruloplasmin: 30 mg/dL (ref 18–53)

## 2014-12-04 NOTE — Telephone Encounter (Signed)
Patient made aware Dr Tat reviewed labs and they look okay. We will call with MR results she is having Saturday. She does not want to having lab for [facility]'s Chorea through [facility] drawn right now. She will let us know if she changes her mind.

## 2014-12-04 NOTE — Telephone Encounter (Signed)
Pt called f/u on the results of her blood work. Please call pt # [phone]

## 2014-12-09 ENCOUNTER — Ambulatory Visit
Admission: RE | Admit: 2014-12-09 | Discharge: 2014-12-09 | Disposition: A | Discharge status: Home / Self Care | Payer: Commercial Managed Care - HMO | Source: Ambulatory Visit | Attending: Neurology | Admitting: Neurology

## 2014-12-09 DIAGNOSIS — G2401 Drug induced subacute dyskinesia: Secondary | ICD-10-CM

## 2014-12-09 DIAGNOSIS — G255 Other chorea: Secondary | ICD-10-CM

## 2014-12-09 MED ORDER — GADOBENATE DIMEGLUMINE 529 MG/ML IV SOLN
14.0000 mL | Freq: Once | INTRAVENOUS | Status: AC | PRN
  Administered 2014-12-09: 14 mL via INTRAVENOUS

## 2014-12-11 ENCOUNTER — Telehealth: Payer: Self-pay | Admitting: Neurology

## 2014-12-11 ENCOUNTER — Telehealth: Payer: Self-pay | Admitting: *Deleted

## 2014-12-11 NOTE — Telephone Encounter (Signed)
-----   Message from Chambers, DO sent at 12/11/2014  8:47 AM EST ----- Reviewed.  Mild number of T2 hyperintensities.  Mild atrophy.  Luvenia Starch, you can let pt know that no significant abnormality on MRI to account for sx's

## 2014-12-11 NOTE — Telephone Encounter (Signed)
I am happy to offer her another opinion.  Would she like that?

## 2014-12-11 NOTE — Telephone Encounter (Signed)
Patient returning your call Call back number [phone]

## 2014-12-11 NOTE — Telephone Encounter (Signed)
Left message on machine for patient to call back. To let us know if she wants a second opinion.

## 2014-12-11 NOTE — Telephone Encounter (Signed)
Spoke with patient and she does not want second opinion. She will call if she changes her mind.

## 2014-12-11 NOTE — Telephone Encounter (Signed)
Patient made aware that MR did not show any significant abnormalities. She asked what the next steps were - I advised that we don't really have a next step if she wasn't interested in the [facility]'s lab through [facility] and she could not stop medication. She states that this is not from her medication and she is not getting lab. I advised patient that we don't really have anything else to offer. She will call as needed.

## 2014-12-27 ENCOUNTER — Encounter: Payer: Self-pay | Admitting: Internal Medicine

## 2014-12-27 ENCOUNTER — Ambulatory Visit (INDEPENDENT_AMBULATORY_CARE_PROVIDER_SITE_OTHER): Payer: Commercial Managed Care - HMO | Admitting: Internal Medicine

## 2014-12-27 VITALS — BP 138/70 | HR 90 | Temp 98.3°F | Wt 149.0 lb

## 2014-12-27 DIAGNOSIS — M19042 Primary osteoarthritis, left hand: Secondary | ICD-10-CM

## 2014-12-27 DIAGNOSIS — M19041 Primary osteoarthritis, right hand: Secondary | ICD-10-CM

## 2014-12-27 DIAGNOSIS — S40021A Contusion of right upper arm, initial encounter: Secondary | ICD-10-CM

## 2014-12-27 NOTE — Progress Notes (Signed)
Subjective:    Patient ID: Adrienne [name], female    DOB: [birth year], 73 y.o.   MRN: [id number]  HPI Here for evaluation of her right hand and a rash on right arm  Thinks it started 2 days ago Wrapped lights around around a fake tree Noticed a rash after that No itching but is sore  Feels she cannot stretch her fingers as much in her right hand No pain Some trouble playing the piano Slight numbness  Has noticed a change in her writing Right handed---feels she had to reteach herself to print Better again now Trouble holding spoon and fork  Current Outpatient Prescriptions on File Prior to Visit  Medication Sig Dispense Refill  . acetaminophen (TYLENOL) 500 MG tablet Take 1,000 mg by mouth every 6 (six) hours as needed for moderate pain.    Marland Kitchen aspirin 81 MG EC tablet Take 81 mg by mouth daily.      . Calcium Carbonate (CALCIUM 600 PO) Take 600 mg by mouth 2 (two) times daily.      . Calcium Polycarbophil (FIBERCON PO) Take 1 capsule by mouth daily as needed (for fiber support).     . Cholecalciferol (VITAMIN D) 1000 UNITS capsule Take 1,000 Units by mouth daily.      . fish oil-omega-3 fatty acids 1000 MG capsule Take 1 g by mouth daily.    Marland Kitchen gabapentin (NEURONTIN) 300 MG capsule Take 600 mg by mouth 2 (two) times daily.    . Loperamide HCl (IMODIUM A-D PO) Take 1 capsule by mouth 2 (two) times daily.    Marland Kitchen LORazepam (ATIVAN) 1 MG tablet Take 0.5 mg by mouth at bedtime.     . Multiple Vitamins-Minerals (OCUVITE PO) Take 1 tablet by mouth 2 (two) times daily.     . phenelzine (NARDIL) 15 MG tablet Take 30 mg by mouth 2 (two) times daily.     . Probiotic Product (PROBIOTIC DAILY) CAPS Take 1 capsule by mouth daily.    . [DISCONTINUED] Omega-3-acid Ethyl Esters (LOVAZA PO) Take 2,000 mg by mouth 2 (two) times daily in the am and at bedtime..       No current facility-administered medications on file prior to visit.    Allergies  Allergen Reactions  . Ivp Dye [Iodinated  Diagnostic Agents] Other (See Comments)    CARDIAC ARREST  . Penicillins Anaphylaxis  . Metoclopramide Hcl Anxiety and Rash    Past Medical History  Diagnosis Date  . Lumbago   . Genital herpes, unspecified   . Depressive disorder, not elsewhere classified   . Spinal stenosis, unspecified region other than cervical   . Anemia, unspecified   . Breast cancer     left  . Disorder of bone and cartilage, unspecified   . Macular degeneration   . Bronchitis   . External hemorrhoids   . IBS (irritable bowel syndrome)   . Depression   . Bipolar affective disorder     Past Surgical History  Procedure Laterality Date  . Abdominal hysterectomy      Total but cervix left  . Breast lumpectomy Left 6/09    Left; high grade carcinoma in situ  . Rectocele repair    . Mri lumbar  05/2007    L4-L5 spinal stenosis  . Bilateral tuboplasty    . Tonsillectomy    . Dilation and curettage of uterus      x 2    Family History  Problem Relation Age of Onset  . Pancreatic  cancer Father   . Prostate cancer Father   . Alzheimer's disease Father   . Heart attack Father   . Ulcerative colitis Mother   . Breast cancer Mother   . Hypertension Brother   . Hyperlipidemia Brother   . Ulcerative colitis Daughter   . Colon cancer Neg Hx     History   Social History  . Marital Status: Divorced    Spouse Name: N/A    Number of Children: 1  . Years of Education: N/A   Occupational History  . Med tech     Retired  .     Social History Main Topics  . Smoking status: Never Smoker   . Smokeless tobacco: Never Used  . Alcohol Use: 0.0 oz/week    10-14 Glasses of wine per week     Comment: 1 cocktail (manhattan) and 1 glass of wine daily  . Drug Use: No  . Sexual Activity: Not on file   Other Topics Concern  . Not on file   Social History Narrative   Working at [employer]      Divorced      No regular exercise; decreased because of spinal stenosis   Has MOST FORM completed,  full code (reviewed 2014)               Review of Systems  Not sick No fever     Objective:   Physical Exam  Musculoskeletal:  Mild thickening of PIPs in both hands Can't spread right fingers as far as left (which is more than normal) but no weakness or obvious pathology  Skin:  Purplish contusion ~5 x 8cm along medial right forearm above elbow--and small area along lateral side higher (like something was pressing while she was wrapping the lights)          Assessment & Plan:

## 2014-12-27 NOTE — Assessment & Plan Note (Signed)
Mild She may want to work on exercises to hopefully get more of the finger movement on the right

## 2014-12-27 NOTE — Progress Notes (Signed)
Pre visit review using our clinic review tool, if applicable. No additional management support is needed unless otherwise documented below in the visit note. 

## 2014-12-27 NOTE — Assessment & Plan Note (Signed)
Reassured--not a rash Should resolve on its own No action needed

## 2015-02-12 ENCOUNTER — Other Ambulatory Visit: Payer: Self-pay | Admitting: Family Medicine

## 2015-02-12 NOTE — Telephone Encounter (Addendum)
Last office visit 12/27/2014.  Ok to refill?

## 2015-02-13 NOTE — Telephone Encounter (Signed)
Approved: okay for a year 

## 2015-02-14 ENCOUNTER — Telehealth: Payer: Self-pay | Admitting: Hematology and Oncology

## 2015-02-14 NOTE — Telephone Encounter (Signed)
, °

## 2015-02-20 ENCOUNTER — Telehealth: Payer: Self-pay | Admitting: Internal Medicine

## 2015-02-20 NOTE — Telephone Encounter (Signed)
Pt dropped off [employer] form that needs to be filled out and signed.  Pt requests that it be directly faxed to [phone] so she doesn't have to come back. Thanks.

## 2015-02-21 ENCOUNTER — Telehealth: Payer: Self-pay | Admitting: Hematology and Oncology

## 2015-02-21 NOTE — Telephone Encounter (Signed)
pt cld left vm wanting appt time-cld & left message of appt time & date

## 2015-02-21 NOTE — Telephone Encounter (Signed)
Form faxed to [place] [facility] at [phone].  Put in to be scanned folder.

## 2015-03-12 ENCOUNTER — Other Ambulatory Visit: Payer: Commercial Managed Care - HMO

## 2015-03-12 ENCOUNTER — Other Ambulatory Visit: Payer: Self-pay | Admitting: *Deleted

## 2015-03-12 ENCOUNTER — Ambulatory Visit: Payer: Medicare PPO

## 2015-03-12 DIAGNOSIS — C50919 Malignant neoplasm of unspecified site of unspecified female breast: Secondary | ICD-10-CM

## 2015-03-13 ENCOUNTER — Other Ambulatory Visit: Payer: Commercial Managed Care - HMO

## 2015-03-13 ENCOUNTER — Ambulatory Visit: Payer: Commercial Managed Care - HMO | Admitting: Hematology and Oncology

## 2015-03-27 ENCOUNTER — Telehealth: Payer: Self-pay | Admitting: Hematology and Oncology

## 2015-03-27 NOTE — Telephone Encounter (Signed)
PT WANTS TO TRANSFER CARE TO [facility].  FAXED PT [place] [phone]

## 2015-04-16 ENCOUNTER — Ambulatory Visit
Admit: 2015-04-16 | Disposition: A | Discharge status: Home / Self Care | Payer: Self-pay | Attending: Hematology and Oncology | Admitting: Hematology and Oncology

## 2015-04-16 DIAGNOSIS — Z853 Personal history of malignant neoplasm of breast: Secondary | ICD-10-CM | POA: Diagnosis not present

## 2015-04-16 DIAGNOSIS — Z90710 Acquired absence of both cervix and uterus: Secondary | ICD-10-CM | POA: Diagnosis not present

## 2015-04-16 DIAGNOSIS — F329 Major depressive disorder, single episode, unspecified: Secondary | ICD-10-CM | POA: Diagnosis not present

## 2015-04-16 DIAGNOSIS — Z880 Allergy status to penicillin: Secondary | ICD-10-CM | POA: Diagnosis not present

## 2015-04-16 DIAGNOSIS — Z79899 Other long term (current) drug therapy: Secondary | ICD-10-CM | POA: Diagnosis not present

## 2015-04-16 DIAGNOSIS — D649 Anemia, unspecified: Secondary | ICD-10-CM | POA: Diagnosis not present

## 2015-04-16 DIAGNOSIS — Z932 Ileostomy status: Secondary | ICD-10-CM | POA: Diagnosis not present

## 2015-04-16 DIAGNOSIS — Z800 Family history of malignant neoplasm of digestive organs: Secondary | ICD-10-CM | POA: Diagnosis not present

## 2015-05-01 ENCOUNTER — Other Ambulatory Visit: Payer: Self-pay | Admitting: Hematology and Oncology

## 2015-05-01 DIAGNOSIS — C50919 Malignant neoplasm of unspecified site of unspecified female breast: Secondary | ICD-10-CM

## 2015-05-07 DIAGNOSIS — F330 Major depressive disorder, recurrent, mild: Secondary | ICD-10-CM | POA: Diagnosis not present

## 2015-05-22 ENCOUNTER — Encounter: Payer: Commercial Managed Care - HMO | Admitting: Internal Medicine

## 2015-07-11 ENCOUNTER — Encounter: Payer: Self-pay | Admitting: Primary Care

## 2015-07-11 ENCOUNTER — Ambulatory Visit (INDEPENDENT_AMBULATORY_CARE_PROVIDER_SITE_OTHER): Payer: Commercial Managed Care - HMO | Admitting: Primary Care

## 2015-07-11 VITALS — BP 126/76 | HR 67 | Temp 97.8°F | Ht 61.0 in | Wt 142.8 lb

## 2015-07-11 DIAGNOSIS — W57XXXA Bitten or stung by nonvenomous insect and other nonvenomous arthropods, initial encounter: Secondary | ICD-10-CM

## 2015-07-11 DIAGNOSIS — T148 Other injury of unspecified body region: Secondary | ICD-10-CM

## 2015-07-11 MED ORDER — CLOBETASOL PROPIONATE 0.05 % EX CREA: 1.0000 "application " | TOPICAL_CREAM | Freq: Two times a day (BID) | CUTANEOUS | Status: DC

## 2015-07-11 NOTE — Progress Notes (Signed)
Pre visit review using our clinic review tool, if applicable. No additional management support is needed unless otherwise documented below in the visit note. 

## 2015-07-11 NOTE — Progress Notes (Signed)
Subjective:    Patient ID: Adrienne [name], female    DOB: 1941/07/14, 74 y.o.   MRN: [id number]  HPI  Adrienne [name] is a 74 year old female who presents today with a chief complaint of rash/bug bites. She first noticed the bites/rash Monday morning. She was working in her garden Sunday morning in her flower beds. She has not come across poison ivy that she's aware of. Her bites have enlarged over the past 2 days. Denies itching, pain. She has applied hydrocortisone cream with some relief. She denies finding ticks to her skin and has not been out in the woods.  Review of Systems  Constitutional: Negative for fever and chills.  Respiratory: Negative for shortness of breath.   Cardiovascular: Negative for chest pain.  Musculoskeletal: Negative for myalgias.  Neurological: Negative for headaches.       Past Medical History  Diagnosis Date  . Lumbago   . Genital herpes, unspecified   . Depressive disorder, not elsewhere classified   . Spinal stenosis, unspecified region other than cervical   . Anemia, unspecified   . Breast cancer     left  . Disorder of bone and cartilage, unspecified   . Macular degeneration   . Bronchitis   . External hemorrhoids   . IBS (irritable bowel syndrome)   . Depression   . Bipolar affective disorder     History   Social History  . Marital Status: Divorced    Spouse Name: N/A  . Number of Children: 1  . Years of Education: N/A   Occupational History  . Med tech     Retired  .     Social History Main Topics  . Smoking status: Never Smoker   . Smokeless tobacco: Never Used  . Alcohol Use: 0.0 oz/week    10-14 Glasses of wine per week     Comment: 1 cocktail (manhattan) and 1 glass of wine daily  . Drug Use: No  . Sexual Activity: Not on file   Other Topics Concern  . Not on file   Social History Narrative   Working at [employer] center      Divorced      No regular exercise; decreased because of spinal stenosis   Has MOST  FORM completed, full code (reviewed 2014)                Past Surgical History  Procedure Laterality Date  . Abdominal hysterectomy      Total but cervix left  . Breast lumpectomy Left 6/09    Left; high grade carcinoma in situ  . Rectocele repair    . Mri lumbar  05/2007    L4-L5 spinal stenosis  . Bilateral tuboplasty    . Tonsillectomy    . Dilation and curettage of uterus      x 2    Family History  Problem Relation Age of Onset  . Pancreatic cancer Father   . Prostate cancer Father   . Alzheimer's disease Father   . Heart attack Father   . Ulcerative colitis Mother   . Breast cancer Mother   . Hypertension Brother   . Hyperlipidemia Brother   . Ulcerative colitis Daughter   . Colon cancer Neg Hx     Allergies  Allergen Reactions  . Ivp Dye [Iodinated Diagnostic Agents] Other (See Comments)    CARDIAC ARREST  . Penicillins Anaphylaxis  . Metoclopramide Hcl Anxiety and Rash    Current Outpatient Prescriptions on File  Prior to Visit  Medication Sig Dispense Refill  . acetaminophen (TYLENOL) 500 MG tablet Take 1,000 mg by mouth every 6 (six) hours as needed for moderate pain.    Marland Kitchen aspirin 81 MG EC tablet Take 81 mg by mouth daily.      . Calcium Carbonate (CALCIUM 600 PO) Take 600 mg by mouth 2 (two) times daily.      . Calcium Polycarbophil (FIBERCON PO) Take 1 capsule by mouth daily as needed (for fiber support).     . Cholecalciferol (VITAMIN D) 1000 UNITS capsule Take 1,000 Units by mouth daily.      . fish oil-omega-3 fatty acids 1000 MG capsule Take 1 g by mouth daily.    Marland Kitchen gabapentin (NEURONTIN) 300 MG capsule TAKE 2 CAPSULES TWICE DAILY 360 capsule 3  . Loperamide HCl (IMODIUM A-D PO) Take 1 capsule by mouth 2 (two) times daily.    Marland Kitchen LORazepam (ATIVAN) 1 MG tablet Take 0.5 mg by mouth at bedtime.     . Multiple Vitamins-Minerals (OCUVITE PO) Take 1 tablet by mouth 2 (two) times daily.     . phenelzine (NARDIL) 15 MG tablet Take 30 mg by mouth 2 (two)  times daily.     . Probiotic Product (PROBIOTIC DAILY) CAPS Take 1 capsule by mouth daily.    . [DISCONTINUED] Omega-3-acid Ethyl Esters (LOVAZA PO) Take 2,000 mg by mouth 2 (two) times daily in the am and at bedtime..       No current facility-administered medications on file prior to visit.    BP 126/76 mmHg  Pulse 67  Temp(Src) 97.8 F (36.6 C) (Oral)  Ht 5\' 1"  (1.549 m)  Wt 142 lb 12.8 oz (64.774 kg)  BMI 27.00 kg/m2  SpO2 98%    Objective:   Physical Exam  Cardiovascular: Normal rate.   Pulmonary/Chest: Effort normal and breath sounds normal.  Skin: Skin is warm and dry.  Multiple red bug bites present to bilateral upper legs (inner thighs) and groin area. These represent what appears to be chigger bites. No cellulitis present.          Assessment & Plan:  Chigger bites:  Numerous. Present to bilateral inner thighs. RX for clobetasol 0.05% cream to apply twice daily. No evidence for cellulitis or infection. Bites are intact but with reddened circumference.  Denies fevers, headaches, body aches. Do no suspect tick bites. Follow up PRN.

## 2015-07-11 NOTE — Patient Instructions (Signed)
Apply cream twice daily to bug bites. Ensure you keep the bites clean and dry. It was nice to meet you!  Insect Bite Mosquitoes, flies, fleas, bedbugs, and many other insects can bite. Insect bites are different from insect stings. A sting is when venom is injected into the skin. Some insect bites can transmit infectious diseases. SYMPTOMS  Insect bites usually turn red, swell, and itch for 2 to 4 days. They often go away on their own. TREATMENT  Your caregiver may prescribe antibiotic medicines if a bacterial infection develops in the bite. HOME CARE INSTRUCTIONS  Do not scratch the bite area.  Keep the bite area clean and dry. Wash the bite area thoroughly with soap and water.  Put ice or cool compresses on the bite area.  Put ice in a plastic bag.  Place a towel between your skin and the bag.  Leave the ice on for 20 minutes, 4 times a day for the first 2 to 3 days, or as directed.  You may apply a baking soda paste, cortisone cream, or calamine lotion to the bite area as directed by your caregiver. This can help reduce itching and swelling.  Only take over-the-counter or prescription medicines as directed by your caregiver.  If you are given antibiotics, take them as directed. Finish them even if you start to feel better. You may need a tetanus shot if:  You cannot remember when you had your last tetanus shot.  You have never had a tetanus shot.  The injury broke your skin. If you get a tetanus shot, your arm may swell, get red, and feel warm to the touch. This is common and not a problem. If you need a tetanus shot and you choose not to have one, there is a rare chance of getting tetanus. Sickness from tetanus can be serious. SEEK IMMEDIATE MEDICAL CARE IF:   You have increased pain, redness, or swelling in the bite area.  You see a red line on the skin coming from the bite.  You have a fever.  You have joint pain.  You have a headache or neck pain.  You have  unusual weakness.  You have a rash.  You have chest pain or shortness of breath.  You have abdominal pain, nausea, or vomiting.  You feel unusually tired or sleepy. MAKE SURE YOU:   Understand these instructions.  Will watch your condition.  Will get help right away if you are not doing well or get worse. Document Released: 01/22/2005 Document Revised: 03/08/2012 Document Reviewed: 07/16/2011 [facility] Patient Information 2015 [place], [place]. This information is not intended to replace advice given to you by your health care provider. Make sure you discuss any questions you have with your health care provider.

## 2015-07-13 ENCOUNTER — Ambulatory Visit
Admission: RE | Admit: 2015-07-13 | Discharge: 2015-07-13 | Disposition: A | Discharge status: Home / Self Care | Payer: Commercial Managed Care - HMO | Source: Ambulatory Visit | Attending: Hematology and Oncology | Admitting: Hematology and Oncology

## 2015-07-13 DIAGNOSIS — Z853 Personal history of malignant neoplasm of breast: Secondary | ICD-10-CM | POA: Diagnosis not present

## 2015-07-13 DIAGNOSIS — R922 Inconclusive mammogram: Secondary | ICD-10-CM | POA: Diagnosis not present

## 2015-07-13 DIAGNOSIS — C50919 Malignant neoplasm of unspecified site of unspecified female breast: Secondary | ICD-10-CM

## 2015-09-10 ENCOUNTER — Encounter: Payer: Self-pay | Admitting: Internal Medicine

## 2015-09-10 ENCOUNTER — Ambulatory Visit (INDEPENDENT_AMBULATORY_CARE_PROVIDER_SITE_OTHER): Payer: Commercial Managed Care - HMO | Admitting: Internal Medicine

## 2015-09-10 VITALS — BP 138/80 | HR 68 | Temp 98.3°F | Ht 61.0 in | Wt 143.0 lb

## 2015-09-10 DIAGNOSIS — E785 Hyperlipidemia, unspecified: Secondary | ICD-10-CM | POA: Diagnosis not present

## 2015-09-10 DIAGNOSIS — Z7189 Other specified counseling: Secondary | ICD-10-CM | POA: Insufficient documentation

## 2015-09-10 DIAGNOSIS — M545 Low back pain: Secondary | ICD-10-CM | POA: Diagnosis not present

## 2015-09-10 DIAGNOSIS — Z23 Encounter for immunization: Secondary | ICD-10-CM | POA: Diagnosis not present

## 2015-09-10 DIAGNOSIS — Z0000 Encounter for general adult medical examination without abnormal findings: Secondary | ICD-10-CM | POA: Diagnosis not present

## 2015-09-10 DIAGNOSIS — F329 Major depressive disorder, single episode, unspecified: Secondary | ICD-10-CM

## 2015-09-10 DIAGNOSIS — D638 Anemia in other chronic diseases classified elsewhere: Secondary | ICD-10-CM

## 2015-09-10 DIAGNOSIS — F322 Major depressive disorder, single episode, severe without psychotic features: Secondary | ICD-10-CM

## 2015-09-10 LAB — CBC WITH DIFFERENTIAL/PLATELET
BASOS ABS: 0 10*3/uL (ref 0.0–0.1)
Basophils Relative: 0.8 % (ref 0.0–3.0)
EOS PCT: 6.5 % — AB (ref 0.0–5.0)
Eosinophils Absolute: 0.3 10*3/uL (ref 0.0–0.7)
HCT: 29.9 % — ABNORMAL LOW (ref 36.0–46.0)
Hemoglobin: 10 g/dL — ABNORMAL LOW (ref 12.0–15.0)
LYMPHS ABS: 1.5 10*3/uL (ref 0.7–4.0)
LYMPHS PCT: 34.4 % (ref 12.0–46.0)
MCHC: 33.3 g/dL (ref 30.0–36.0)
MCV: 85.9 fl (ref 78.0–100.0)
MONOS PCT: 7.8 % (ref 3.0–12.0)
Monocytes Absolute: 0.3 10*3/uL (ref 0.1–1.0)
NEUTROS PCT: 50.5 % (ref 43.0–77.0)
Neutro Abs: 2.2 10*3/uL (ref 1.4–7.7)
Platelets: 243 10*3/uL (ref 150.0–400.0)
RBC: 3.49 Mil/uL — AB (ref 3.87–5.11)
RDW: 14.6 % (ref 11.5–15.5)
WBC: 4.4 10*3/uL (ref 4.0–10.5)

## 2015-09-10 LAB — COMPREHENSIVE METABOLIC PANEL
ALK PHOS: 48 U/L (ref 39–117)
ALT: 14 U/L (ref 0–35)
AST: 26 U/L (ref 0–37)
Albumin: 3.9 g/dL (ref 3.5–5.2)
BUN: 20 mg/dL (ref 6–23)
CALCIUM: 10 mg/dL (ref 8.4–10.5)
CO2: 32 mEq/L (ref 19–32)
Chloride: 105 mEq/L (ref 96–112)
Creatinine, Ser: 0.76 mg/dL (ref 0.40–1.20)
GFR: 79.49 mL/min (ref >60.00)
GLUCOSE: 96 mg/dL (ref 70–99)
POTASSIUM: 4.3 meq/L (ref 3.5–5.1)
Sodium: 142 mEq/L (ref 135–145)
Total Bilirubin: 0.4 mg/dL (ref 0.2–1.2)
Total Protein: 6.8 g/dL (ref 6.0–8.3)

## 2015-09-10 LAB — LIPID PANEL
CHOLESTEROL: 198 mg/dL (ref 0–200)
HDL: 76.6 mg/dL (ref >39.00)
LDL Cholesterol: 108 mg/dL — ABNORMAL HIGH (ref 0–99)
NonHDL: 121.78
Total CHOL/HDL Ratio: 3
Triglycerides: 68 mg/dL (ref 0.0–149.0)
VLDL: 13.6 mg/dL (ref 0.0–40.0)

## 2015-09-10 LAB — T4, FREE: Free T4: 0.66 ng/dL (ref 0.60–1.60)

## 2015-09-10 NOTE — Assessment & Plan Note (Signed)
Low risk profile with high HDL Will recheck

## 2015-09-10 NOTE — Assessment & Plan Note (Signed)
Seems to be well controlled on nardil

## 2015-09-10 NOTE — Assessment & Plan Note (Signed)
Long standing  Will recheck labs

## 2015-09-10 NOTE — Assessment & Plan Note (Signed)
I have personally reviewed the Medicare Annual Wellness questionnaire and have noted 1. The patient's medical and social history 2. Their use of alcohol, tobacco or illicit drugs 3. Their current medications and supplements 4. The patient's functional ability including ADL's, fall risks, home safety risks and hearing or visual             impairment. 5. Diet and physical activities 6. Evidence for depression or mood disorders  The patients weight, height, BMI and visual acuity have been recorded in the chart I have made referrals, counseling and provided education to the patient based review of the above and I have provided the pt with a written personalized care plan for preventive services.  I have provided you with a copy of your personalized plan for preventive services. Please take the time to review along with your updated medication list.  Will give prevnar and flu vaccine Yearly mammogram still Colonoscopy not due to 2023--will consider then Discussed exercise---core strengthening

## 2015-09-10 NOTE — Progress Notes (Signed)
Subjective:    Patient ID: Adrienne [name], female    DOB: [birth year], 74 y.o.   MRN: [id number]  HPI Here for Medicare wellness visit and follow up of chronic medical conditions Reviewed form and advanced directives Reviewed other doctors Does walk some --and [facility] (but doesn't like Chief of Staff) Vision okay--may need Rx change Hearing is good No falls Independent with instrumental ADLs No memory or cognitive problems 1-2 drinks daily (Manhattan or wine) No tobacco  Sees Dr Teacher, early years/pre ---for chronic back pain He recommended Dr Sharlet Salina but he is not in her HMO Has done well with ESI in past Hard getting out of bed in AM Uses tylenol at bedtime if she has had a busy day (like in the garden) Uses the gabapentin chronically for this  Breast cancer 5-6 years ago Had RT Keeps up with mammograms and cancer follow up  Chronic anemia Hemoglobin runs around 11 regularly This affects her aerobic fitness  Continues to see Dr Casimiro Needle for psychiatry Chronic depression which is controlled on her nardil Mild tremor in hands--- better than before (?tardive dyskinesia--but she still relates to [facility])  Current Outpatient Prescriptions on File Prior to Visit  Medication Sig Dispense Refill  . acetaminophen (TYLENOL) 500 MG tablet Take 1,000 mg by mouth every 6 (six) hours as needed for moderate pain.    Marland Kitchen aspirin 81 MG EC tablet Take 81 mg by mouth daily.      . Calcium Carbonate (CALCIUM 600 PO) Take 600 mg by mouth 2 (two) times daily.      . Cholecalciferol (VITAMIN D) 1000 UNITS capsule Take 1,000 Units by mouth daily.      . clobetasol cream (TEMOVATE) 3.84 % Apply 1 application topically 2 (two) times daily. 30 g 0  . fish oil-omega-3 fatty acids 1000 MG capsule Take 1 g by mouth daily.    Marland Kitchen gabapentin (NEURONTIN) 300 MG capsule TAKE 2 CAPSULES TWICE DAILY 360 capsule 3  . Loperamide HCl (IMODIUM A-D PO) Take 1 capsule by mouth 2 (two) times daily.    Marland Kitchen LORazepam  (ATIVAN) 1 MG tablet Take 0.5 mg by mouth at bedtime.     . Multiple Vitamins-Minerals (OCUVITE PO) Take 1 tablet by mouth 2 (two) times daily.     . phenelzine (NARDIL) 15 MG tablet Take 30 mg by mouth 2 (two) times daily.     . [DISCONTINUED] Omega-3-acid Ethyl Esters (LOVAZA PO) Take 2,000 mg by mouth 2 (two) times daily in the am and at bedtime..       No current facility-administered medications on file prior to visit.    Allergies  Allergen Reactions  . Ivp Dye [Iodinated Diagnostic Agents] Other (See Comments)    CARDIAC ARREST  . Penicillins Anaphylaxis  . Metoclopramide Hcl Anxiety and Rash    Past Medical History  Diagnosis Date  . Lumbago   . Genital herpes, unspecified   . Depressive disorder, not elsewhere classified   . Spinal stenosis, unspecified region other than cervical   . Anemia, unspecified   . Breast cancer     left  . Disorder of bone and cartilage, unspecified   . Macular degeneration   . Bronchitis   . External hemorrhoids   . IBS (irritable bowel syndrome)   . Depression   . Bipolar affective disorder     Past Surgical History  Procedure Laterality Date  . Abdominal hysterectomy      Total but cervix left  . Breast lumpectomy  Left 6/09    Left; high grade carcinoma in situ  . Rectocele repair    . Mri lumbar  05/2007    L4-L5 spinal stenosis  . Bilateral tuboplasty    . Tonsillectomy    . Dilation and curettage of uterus      x 2    Family History  Problem Relation Age of Onset  . Pancreatic cancer Father   . Prostate cancer Father   . Alzheimer's disease Father   . Heart attack Father   . Ulcerative colitis Mother   . Breast cancer Mother   . Hypertension Brother   . Hyperlipidemia Brother   . Ulcerative colitis Daughter   . Colon cancer Neg Hx     Social History   Social History  . Marital Status: Divorced    Spouse Name: N/A  . Number of Children: 1  . Years of Education: N/A   Occupational History  . Med tech      Retired   Social History Main Topics  . Smoking status: Never Smoker   . Smokeless tobacco: Never Used  . Alcohol Use: 0.0 oz/week    10-14 Glasses of wine per week     Comment: 1 cocktail (manhattan) and 1 glass of wine daily  . Drug Use: No  . Sexual Activity: Not on file   Other Topics Concern  . Not on file   Social History Narrative   Has living will   Daughter is health care POA   Would accept resuscitation attempts   Probably wouldn't want tube feedings if cognitively aware   Body donation to [facility] already arranged         Review of Systems Trouble initiating sleep--then does okay Appetite is good Weight stable Teeth are fine--keeps up with dentist [facility] clinic) Wears seat belt No rash or suspicious skin lesions No cough or SOB No dizziness or syncope No CP or palpitations    Objective:   Physical Exam  Constitutional: She is oriented to person, place, and time. She appears well-developed and well-nourished. No distress.  HENT:  Mouth/Throat: Oropharynx is clear and moist. No oropharyngeal exudate.  Neck: Normal range of motion. Neck supple. No thyromegaly present.  Cardiovascular: Normal rate, regular rhythm, normal heart sounds and intact distal pulses.  Exam reveals no gallop.   No murmur heard. Pulmonary/Chest: Effort normal and breath sounds normal. No respiratory distress. She has no wheezes. She has no rales.  Abdominal: Soft. There is no tenderness.  Musculoskeletal: She exhibits no edema or tenderness.  Lymphadenopathy:    She has no cervical adenopathy.  Neurological: She is alert and oriented to person, place, and time.  President-- "Obama, Reagan?Marland Kitchen... " [id number] Recall 2/3  Skin: No erythema.  Psychiatric: She has a normal mood and affect. Her behavior is normal.          Assessment & Plan:

## 2015-09-10 NOTE — Progress Notes (Signed)
Pre visit review using our clinic review tool, if applicable. No additional management support is needed unless otherwise documented below in the visit note. 

## 2015-09-10 NOTE — Assessment & Plan Note (Signed)
See social history 

## 2015-09-10 NOTE — Progress Notes (Signed)
   Subjective:    Patient ID: Adrienne [name], female    DOB: July 06, 1941, 74 y.o.   MRN: [id number]  HPI    Review of Systems     Objective:   Physical Exam  Cardiovascular:  Soft systolic murmur at base          Assessment & Plan:

## 2015-09-10 NOTE — Assessment & Plan Note (Signed)
Discussed back strengthening Consider physiatry if worsens

## 2015-09-10 NOTE — Addendum Note (Signed)
Addended by: Despina Hidden on: 09/10/2015 12:39 PM   Modules accepted: Orders

## 2015-09-10 NOTE — Patient Instructions (Signed)
Please ask a trainer about a core strengthening program to help your back pain.

## 2015-10-02 DIAGNOSIS — F330 Major depressive disorder, recurrent, mild: Secondary | ICD-10-CM | POA: Diagnosis not present

## 2015-10-11 ENCOUNTER — Telehealth: Payer: Self-pay | Admitting: Internal Medicine

## 2015-10-11 NOTE — Telephone Encounter (Signed)
Form on your desk  

## 2015-10-11 NOTE — Telephone Encounter (Signed)
Form signed No charge 

## 2015-10-11 NOTE — Telephone Encounter (Signed)
Pt brought in ppw from [facility] to be sign (on second page/hightlighted) by Dr. Silvio Pate regarding [facility] referrals. Best number to contact pt is [phone]. PPW is in Dr. Alla German slot in rx tower.

## 2015-10-11 NOTE — Telephone Encounter (Signed)
I left a message on patient's voice mail notifying her that form is ready for pick up.

## 2015-10-19 ENCOUNTER — Telehealth: Payer: Self-pay | Admitting: Internal Medicine

## 2015-10-19 NOTE — Telephone Encounter (Signed)
Spoke with patient and advised results, and she doesn't think it's an infection but will call if anything changes.

## 2015-10-19 NOTE — Telephone Encounter (Signed)
If this is new, we need to make sure she doesn't have an infection---and this requires a visit. If it is ongoing, she can try to do pelvic muscle strengthening (Kegel's) and then tighten her muscles in the morning and hope that she can hold till she can make it to the bathroom. There are some medications for overactive bladder (including one that is now OTC) --but they have potential side effects and don't always work Many women just use a pad at night--to catch any leakage before they can make it to the bathroom

## 2015-10-19 NOTE — Telephone Encounter (Signed)
Pt called stating she is having urinary incontinence in the morning when she get out of bed.  She goes the bathroom before she goes to bed. She is ok all day long only  the first thing in the morning.  Offered appointment  Pt refused she wanted to know what she can do

## 2015-10-25 DIAGNOSIS — H2513 Age-related nuclear cataract, bilateral: Secondary | ICD-10-CM | POA: Diagnosis not present

## 2015-10-31 DIAGNOSIS — Z0100 Encounter for examination of eyes and vision without abnormal findings: Secondary | ICD-10-CM | POA: Diagnosis not present

## 2015-11-01 ENCOUNTER — Encounter: Payer: Self-pay | Admitting: Internal Medicine

## 2015-12-18 ENCOUNTER — Other Ambulatory Visit: Payer: Self-pay | Admitting: Internal Medicine

## 2016-02-07 ENCOUNTER — Other Ambulatory Visit: Payer: Self-pay

## 2016-02-07 MED ORDER — GABAPENTIN 300 MG PO CAPS: 600.0000 mg | ORAL_CAPSULE | Freq: Two times a day (BID) | ORAL | Status: DC

## 2016-02-07 NOTE — Telephone Encounter (Signed)
Pt has changed mail order pharmacy to [facility] and request remaining refills from previous rx for gabapentin that went to [place] [facility]. Advised pt done and pt voiced understanding. Pt said [facility] will no longer send her meds due to pharmacy change.

## 2016-02-11 ENCOUNTER — Telehealth: Payer: Self-pay | Admitting: Internal Medicine

## 2016-02-11 NOTE — Telephone Encounter (Signed)
[facility]  Patient Name: Adrienne [name]  DOB: 07-13-1941    Initial Comment Caller states dizziness 3 days;    Nurse Assessment  Nurse: Wayne Sever, RN, Tillie Rung Date/Time (Eastern Time): 02/11/2016 11:48:29 AM  Confirm and document reason for call. If symptomatic, describe symptoms. You must click the next button to save text entered. ---Caller is c/o dizziness for 3 days. She states she feels fine other than the dizziness  Has the patient traveled out of the country within the last 30 days? ---Not Applicable  Does the patient have any new or worsening symptoms? ---Yes  Will a triage be completed? ---Yes  Related visit to physician within the last 2 weeks? ---N/A  Does the PT have any chronic conditions? (i.e. diabetes, asthma, etc.) ---Yes  List chronic conditions. ---Depression, Chronic Leg Pain  Is this a behavioral health or substance abuse call? ---No     Guidelines    Guideline Title Affirmed Question Affirmed Notes  Dizziness - Lightheadedness [1] MODERATE dizziness (e.g., interferes with normal activities) AND [2] has NOT been evaluated by physician for this (Exception: dizziness caused by heat exposure, sudden standing, or poor fluid intake)    Final Disposition User   See Physician within 24 Hours Wayne Sever, RN, [employer]    Referrals  REFERRED TO PCP OFFICE   Disagree/Comply: Leta Baptist

## 2016-02-11 NOTE — Telephone Encounter (Signed)
Pt has appt with Dr Diona Browner on 02/12/16 at 9 AM.

## 2016-02-12 ENCOUNTER — Ambulatory Visit (INDEPENDENT_AMBULATORY_CARE_PROVIDER_SITE_OTHER): Payer: Medicare Other | Admitting: Family Medicine

## 2016-02-12 ENCOUNTER — Encounter: Payer: Self-pay | Admitting: Family Medicine

## 2016-02-12 VITALS — BP 124/72 | HR 81 | Temp 98.1°F | Ht 61.0 in | Wt 147.0 lb

## 2016-02-12 DIAGNOSIS — F418 Other specified anxiety disorders: Secondary | ICD-10-CM | POA: Diagnosis not present

## 2016-02-12 DIAGNOSIS — F329 Major depressive disorder, single episode, unspecified: Secondary | ICD-10-CM | POA: Diagnosis not present

## 2016-02-12 NOTE — Assessment & Plan Note (Signed)
Nml neuro exam. Likely dizziness with situational anxiety and panic. Pt much better now and has a plan.  Symtpoms resolved.

## 2016-02-12 NOTE — Progress Notes (Signed)
Pre visit review using our clinic review tool, if applicable. No additional management support is needed unless otherwise documented below in the visit note. 

## 2016-02-12 NOTE — Assessment & Plan Note (Signed)
Stable control on nardil.

## 2016-02-12 NOTE — Progress Notes (Signed)
Subjective:    Patient ID: Adrienne [name], female    DOB: Mar 15, 1941, 75 y.o.   MRN: [id number]  HPI  75 year old female pt with history of depression, iron def anemia presents with new onset dizziness.  She reports she had an episode of lightheadedness, blurred vision. Off balance, no room spinning. No headache, no new numbness, no weakness. No known bleeding.  She was very stressed out about bills at bank. Account went below zero. She felt very panick andupset. She called financial advisor on Monday. After she talked to him dizziness went away.  She feels depression is well controlled on nardil long term.      BP Readings from Last 3 Encounters:  02/12/16 124/72  09/10/15 138/80  07/11/15 126/76      Social History /Family History/Past Medical History reviewed and updated if needed.   Review of Systems  Constitutional: Negative for fever and fatigue.  HENT: Negative for ear pain.   Eyes: Negative for pain.  Respiratory: Negative for chest tightness and shortness of breath.   Cardiovascular: Negative for chest pain, palpitations and leg swelling.  Gastrointestinal: Negative for abdominal pain.  Genitourinary: Negative for dysuria.  Neurological: Positive for dizziness. Negative for tremors, syncope, speech difficulty, weakness and numbness.       Objective:   Physical Exam  Constitutional: She is oriented to person, place, and time. Vital signs are normal. She appears well-developed and well-nourished. She is cooperative.  Non-toxic appearance. She does not appear ill. No distress.  HENT:  Head: Normocephalic.  Right Ear: Hearing, tympanic membrane, external ear and ear canal normal. Tympanic membrane is not erythematous, not retracted and not bulging.  Left Ear: Hearing, tympanic membrane, external ear and ear canal normal. Tympanic membrane is not erythematous, not retracted and not bulging.  Nose: No mucosal edema or rhinorrhea. Right sinus exhibits no  maxillary sinus tenderness and no frontal sinus tenderness. Left sinus exhibits no maxillary sinus tenderness and no frontal sinus tenderness.  Mouth/Throat: Uvula is midline, oropharynx is clear and moist and mucous membranes are normal.  Eyes: Conjunctivae, EOM and lids are normal. Pupils are equal, round, and reactive to light. Lids are everted and swept, no foreign bodies found.  Neck: Trachea normal and normal range of motion. Neck supple. Carotid bruit is not present. No thyroid mass and no thyromegaly present.  Cardiovascular: Normal rate, regular rhythm, S1 normal, S2 normal, normal heart sounds, intact distal pulses and normal pulses.  Exam reveals no gallop and no friction rub.   No murmur heard. Pulmonary/Chest: Effort normal and breath sounds normal. No tachypnea. No respiratory distress. She has no decreased breath sounds. She has no wheezes. She has no rhonchi. She has no rales.  Abdominal: Soft. Normal appearance and bowel sounds are normal. There is no tenderness.  Neurological: She is alert and oriented to person, place, and time. She has normal strength and normal reflexes. No cranial nerve deficit or sensory deficit. She exhibits normal muscle tone. She displays a negative Romberg sign. Coordination and gait normal. GCS eye subscore is 4. GCS verbal subscore is 5. GCS motor subscore is 6.  Nml cerebellar exam   No papilledema  Skin: Skin is warm, dry and intact. No rash noted.  Psychiatric: She has a normal mood and affect. Her speech is normal and behavior is normal. Judgment and thought content normal. Her mood appears not anxious. Cognition and memory are normal. Cognition and memory are not impaired. She does not exhibit a  depressed mood. She exhibits normal recent memory and normal remote memory.          Assessment & Plan:

## 2016-04-01 DIAGNOSIS — F330 Major depressive disorder, recurrent, mild: Secondary | ICD-10-CM | POA: Diagnosis not present

## 2016-04-17 ENCOUNTER — Other Ambulatory Visit: Payer: Self-pay | Admitting: *Deleted

## 2016-04-17 DIAGNOSIS — D0512 Intraductal carcinoma in situ of left breast: Secondary | ICD-10-CM

## 2016-04-18 ENCOUNTER — Inpatient Hospital Stay: Payer: Medicare Other | Admitting: Hematology and Oncology

## 2016-04-18 ENCOUNTER — Inpatient Hospital Stay: Payer: Medicare Other

## 2016-05-12 ENCOUNTER — Ambulatory Visit: Payer: Medicare Other | Admitting: Hematology and Oncology

## 2016-05-12 ENCOUNTER — Inpatient Hospital Stay (HOSPITAL_BASED_OUTPATIENT_CLINIC_OR_DEPARTMENT_OTHER): Payer: Medicare Other | Admitting: Hematology and Oncology

## 2016-05-12 ENCOUNTER — Inpatient Hospital Stay: Payer: Medicare Other | Attending: Hematology and Oncology

## 2016-05-12 VITALS — BP 138/78 | HR 82 | Temp 96.2°F | Resp 18 | Ht 61.0 in | Wt 143.6 lb

## 2016-05-12 DIAGNOSIS — F319 Bipolar disorder, unspecified: Secondary | ICD-10-CM | POA: Insufficient documentation

## 2016-05-12 DIAGNOSIS — K589 Irritable bowel syndrome without diarrhea: Secondary | ICD-10-CM | POA: Diagnosis not present

## 2016-05-12 DIAGNOSIS — Z7982 Long term (current) use of aspirin: Secondary | ICD-10-CM | POA: Insufficient documentation

## 2016-05-12 DIAGNOSIS — M4806 Spinal stenosis, lumbar region: Secondary | ICD-10-CM | POA: Insufficient documentation

## 2016-05-12 DIAGNOSIS — Z171 Estrogen receptor negative status [ER-]: Secondary | ICD-10-CM | POA: Insufficient documentation

## 2016-05-12 DIAGNOSIS — Z79899 Other long term (current) drug therapy: Secondary | ICD-10-CM | POA: Diagnosis not present

## 2016-05-12 DIAGNOSIS — D649 Anemia, unspecified: Secondary | ICD-10-CM | POA: Insufficient documentation

## 2016-05-12 DIAGNOSIS — Z923 Personal history of irradiation: Secondary | ICD-10-CM

## 2016-05-12 DIAGNOSIS — D0512 Intraductal carcinoma in situ of left breast: Secondary | ICD-10-CM | POA: Diagnosis not present

## 2016-05-12 DIAGNOSIS — Z780 Asymptomatic menopausal state: Secondary | ICD-10-CM

## 2016-05-12 DIAGNOSIS — H3530 Unspecified macular degeneration: Secondary | ICD-10-CM | POA: Diagnosis not present

## 2016-05-12 DIAGNOSIS — C50912 Malignant neoplasm of unspecified site of left female breast: Secondary | ICD-10-CM

## 2016-05-12 DIAGNOSIS — D638 Anemia in other chronic diseases classified elsewhere: Secondary | ICD-10-CM

## 2016-05-12 LAB — CBC WITH DIFFERENTIAL/PLATELET
Basophils Absolute: 0 10*3/uL (ref 0–0.1)
Basophils Relative: 1 %
Eosinophils Absolute: 0.3 10*3/uL (ref 0–0.7)
Eosinophils Relative: 7 %
HCT: 33.4 % — ABNORMAL LOW (ref 35.0–47.0)
Hemoglobin: 11.4 g/dL — ABNORMAL LOW (ref 12.0–16.0)
Lymphocytes Relative: 32 %
Lymphs Abs: 1.5 10*3/uL (ref 1.0–3.6)
MCH: 29.5 pg (ref 26.0–34.0)
MCHC: 34.1 g/dL (ref 32.0–36.0)
MCV: 86.5 fL (ref 80.0–100.0)
Monocytes Absolute: 0.4 10*3/uL (ref 0.2–0.9)
Monocytes Relative: 9 %
Neutro Abs: 2.5 10*3/uL (ref 1.4–6.5)
Neutrophils Relative %: 51 %
Platelets: 246 10*3/uL (ref 150–440)
RBC: 3.86 MIL/uL (ref 3.80–5.20)
RDW: 13.7 % (ref 11.5–14.5)
WBC: 4.8 10*3/uL (ref 3.6–11.0)

## 2016-05-12 LAB — COMPREHENSIVE METABOLIC PANEL
ALT: 16 U/L (ref 14–54)
AST: 28 U/L (ref 15–41)
Albumin: 4.2 g/dL (ref 3.5–5.0)
Alkaline Phosphatase: 55 U/L (ref 38–126)
Anion gap: 8 (ref 5–15)
BUN: 19 mg/dL (ref 6–20)
CO2: 30 mmol/L (ref 22–32)
Calcium: 9.6 mg/dL (ref 8.9–10.3)
Chloride: 102 mmol/L (ref 101–111)
Creatinine, Ser: 0.81 mg/dL (ref 0.44–1.00)
GFR calc Af Amer: >60 mL/min (ref >60)
GFR calc non Af Amer: >60 mL/min (ref >60)
Glucose, Bld: 108 mg/dL — ABNORMAL HIGH (ref 65–99)
Potassium: 4.3 mmol/L (ref 3.5–5.1)
Sodium: 140 mmol/L (ref 135–145)
Total Bilirubin: 0.5 mg/dL (ref 0.3–1.2)
Total Protein: 7.4 g/dL (ref 6.5–8.1)

## 2016-05-12 LAB — FERRITIN: Ferritin: 67 ng/mL (ref 11–307)

## 2016-05-12 NOTE — Progress Notes (Signed)
Pt reports pulling off tick three days ago and would like to know if we could prescribe antibiotics for that.  Pt reports she was sent to ER last time for tick for [facility] spotted fever.  No other concerns breast related noted at this time.   Pt reports Mammo is due in August.

## 2016-05-12 NOTE — Progress Notes (Signed)
[facility] day:  05/12/2016  Chief Complaint: Adrienne [name] is a 75 y.o. female with a history of DCIS and a normocytic anemia who is seen for 1 year assessment.  HPI: The patient was last seen in the medical oncology clinic on 04/16/2015.  At that time, the patient was seen for initial assessment by me.  Symptomatically, her energy level was good. Her weight was stable. She denied any breast concerns. Exam was unremarkable.  Bilateral mammogram on 07/13/2015 revealed no mammographic evidence of malignancy in either breast.  During the interim, she has done well.  She denies any breast concerns.  Regarding her anemia, she notes a barely low hemoglobin for years.  She notes a very good diet.  She denies any melena or hematochezia.  She notes a history of [facility] Spotted fever years ago. She notes a recent history of ticks while working in the garden.   Past Medical History  Diagnosis Date  . Lumbago   . Genital herpes, unspecified   . Depressive disorder, not elsewhere classified   . Spinal stenosis, unspecified region other than cervical   . Anemia, unspecified   . Breast cancer ([facility])     left  . Disorder of bone and cartilage, unspecified   . Macular degeneration   . Bronchitis   . External hemorrhoids   . IBS (irritable bowel syndrome)   . Depression   . Bipolar affective disorder [facility])     Past Surgical History  Procedure Laterality Date  . Abdominal hysterectomy      Total but cervix left  . Breast lumpectomy Left 6/09    Left; high grade carcinoma in situ  . Rectocele repair    . Mri lumbar  05/2007    L4-L5 spinal stenosis  . Bilateral tuboplasty    . Tonsillectomy    . Dilation and curettage of uterus      x 2    Family History  Problem Relation Age of Onset  . Pancreatic cancer Father   . Prostate cancer Father   . Alzheimer's disease Father   . Heart attack Father   . Ulcerative colitis Mother    . Breast cancer Mother   . Hypertension Brother   . Hyperlipidemia Brother   . Ulcerative colitis Daughter   . Colon cancer Neg Hx     Social History:  reports that she has never smoked. She has never used smokeless tobacco. She reports that she drinks alcohol. She reports that she does not use illicit drugs.  The patient lives in [place] in [place].. She states that she is into "everything". 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  She may have a drink before dinner. The patient is alone today.  Allergies:  Allergies  Allergen Reactions  . Ivp Dye [Iodinated Diagnostic Agents] Other (See Comments)    CARDIAC ARREST  . Penicillins Anaphylaxis  . Metoclopramide Hcl Anxiety and Rash    Current Medications: Current Outpatient Prescriptions  Medication Sig Dispense Refill  . acetaminophen (TYLENOL) 500 MG tablet Take 1,000 mg by mouth every 6 (six) hours as needed for moderate pain.    Marland Kitchen aspirin 81 MG EC tablet Take 81 mg by mouth daily.      . Calcium Carbonate (CALCIUM 600 PO) Take 600 mg by mouth 2 (two) times daily.      . Cholecalciferol (VITAMIN D) 1000 UNITS capsule Take 1,000 Units by mouth daily.      Marland Kitchen  fish oil-omega-3 fatty acids 1000 MG capsule Take 1 g by mouth daily.    Marland Kitchen gabapentin (NEURONTIN) 300 MG capsule Take 2 capsules (600 mg total) by mouth 2 (two) times daily. 360 capsule 2  . Loperamide HCl (IMODIUM A-D PO) Take 1 capsule by mouth 2 (two) times daily.    Marland Kitchen LORazepam (ATIVAN) 1 MG tablet Take 0.5 mg by mouth at bedtime.     . Multiple Vitamins-Minerals (OCUVITE PO) Take 1 tablet by mouth 2 (two) times daily.     . phenelzine (NARDIL) 15 MG tablet Take 30 mg by mouth 2 (two) times daily.     . [DISCONTINUED] Omega-3-acid Ethyl Esters (LOVAZA PO) Take 2,000 mg by mouth 2 (two) times daily in the am and at bedtime..       No current facility-administered medications for this visit.    Review of Systems:  GENERAL:  Feels good.  Active.  No  fevers, sweats or weight loss. PERFORMANCE STATUS (ECOG):  0 HEENT:  No visual changes, runny nose, sore throat, mouth sores or tenderness. Lungs: No shortness of breath or cough.  No hemoptysis. Cardiac:  No chest pain, palpitations, orthopnea, or PND. GI:  No nausea, vomiting, diarrhea, constipation, melena or hematochezia. GU:  No urgency, frequency, dysuria, or hematuria. Musculoskeletal:  No back pain.  No joint pain.  No muscle tenderness. Extremities:  No pain or swelling. Skin:  No rashes or skin changes. Neuro:  No headache, numbness or weakness, balance or coordination issues. Endocrine:  No diabetes, thyroid issues, hot flashes or night sweats. Psych:  No mood changes, depression or anxiety. Pain:  No focal pain. Review of systems:  All other systems reviewed and found to be negative.  Physical Exam: Blood pressure 138/78, pulse 82, temperature 96.2 F (35.7 C), temperature source Tympanic, resp. rate 18, height '5\' 1"'$  (1.549 m), weight 143 lb 10.1 oz (65.15 kg). GENERAL:  Well developed, well nourished, woman sitting comfortably in the exam room in no acute distress. MENTAL STATUS:  Alert and oriented to person, place and time. HEAD:  Short styled hair.  Normocephalic, atraumatic, face symmetric, no Cushingoid features. EYES:  Glasses. Brown eyes.  Pupils equal round and reactive to light and accomodation.  No conjunctivitis or scleral icterus. ENT:  Oropharynx clear without lesion.  Tongue normal. Mucous membranes moist.  RESPIRATORY:  Clear to auscultation without rales, wheezes or rhonchi. CARDIOVASCULAR:  Regular rate and rhythm without murmur, rub or gallop. BREASTS:  Right breast without masses, skin changes or nipple discharge.  Fibrocystic changes.  Left breast with post-operative changes at the 5 o'clock position.  Inverted nipple (chronic).  No masses, skin changes or nipple discharge.  ABDOMEN:  Soft, non-tender, with active bowel sounds, and no hepatosplenomegaly.   No masses. SKIN:  No rashes, ulcers or lesions. EXTREMITIES: No edema, no skin discoloration or tenderness.  No palpable cords. LYMPH NODES: No palpable cervical, supraclavicular, axillary or inguinal adenopathy  NEUROLOGICAL: Unremarkable. PSYCH:  Appropriate.  Appointment on 05/12/2016  Component Date Value Ref Range Status  . WBC 05/12/2016 4.8  3.6 - 11.0 K/uL Final  . RBC 05/12/2016 3.86  3.80 - 5.20 MIL/uL Final  . Hemoglobin 05/12/2016 11.4* 12.0 - 16.0 g/dL Final  . HCT 05/12/2016 33.4* 35.0 - 47.0 % Final  . MCV 05/12/2016 86.5  80.0 - 100.0 fL Final  . MCH 05/12/2016 29.5  26.0 - 34.0 pg Final  . MCHC 05/12/2016 34.1  32.0 - 36.0 g/dL Final  . RDW 05/12/2016  13.7  11.5 - 14.5 % Final  . Platelets 05/12/2016 246  150 - 440 K/uL Final  . Neutrophils Relative % 05/12/2016 51   Final  . Neutro Abs 05/12/2016 2.5  1.4 - 6.5 K/uL Final  . Lymphocytes Relative 05/12/2016 32   Final  . Lymphs Abs 05/12/2016 1.5  1.0 - 3.6 K/uL Final  . Monocytes Relative 05/12/2016 9   Final  . Monocytes Absolute 05/12/2016 0.4  0.2 - 0.9 K/uL Final  . Eosinophils Relative 05/12/2016 7   Final  . Eosinophils Absolute 05/12/2016 0.3  0 - 0.7 K/uL Final  . Basophils Relative 05/12/2016 1   Final  . Basophils Absolute 05/12/2016 0.0  0 - 0.1 K/uL Final  . Sodium 05/12/2016 140  135 - 145 mmol/L Final  . Potassium 05/12/2016 4.3  3.5 - 5.1 mmol/L Final  . Chloride 05/12/2016 102  101 - 111 mmol/L Final  . CO2 05/12/2016 30  22 - 32 mmol/L Final  . Glucose, Bld 05/12/2016 108* 65 - 99 mg/dL Final  . BUN 05/12/2016 19  6 - 20 mg/dL Final  . Creatinine, Ser 05/12/2016 0.81  0.44 - 1.00 mg/dL Final  . Calcium 05/12/2016 9.6  8.9 - 10.3 mg/dL Final  . Total Protein 05/12/2016 7.4  6.5 - 8.1 g/dL Final  . Albumin 05/12/2016 4.2  3.5 - 5.0 g/dL Final  . AST 05/12/2016 28  15 - 41 U/L Final  . ALT 05/12/2016 16  14 - 54 U/L Final  . Alkaline Phosphatase 05/12/2016 55  38 - 126 U/L Final  . Total  Bilirubin 05/12/2016 0.5  0.3 - 1.2 mg/dL Final  . GFR calc non Af Amer 05/12/2016 >60  >60 mL/min Final  . GFR calc Af Amer 05/12/2016 >60  >60 mL/min Final   Comment: (NOTE) The eGFR has been calculated using the CKD EPI equation. This calculation has not been validated in all clinical situations. eGFR's persistently <60 mL/min signify possible Chronic Kidney Disease.   . Anion gap 05/12/2016 8  5 - 15 Final    Assessment:  Adrienne [name] is a 75 y.o. 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 Margins were negative. Estrogen receptor was 0% and progesterone receptor 0%.  She received her radiation following her lumpectomy. Bilateral mammogram on 07/13/2015 revealed no mammographic evidence of malignancy in either breast.  She has a longstanding history of a mild normocytic normochromic anemia. Hemoglobin has ranged between 10.6 and 10.9.  She has no evidence of renal insufficiency. Workup for multiple myeloma was negative. She denies any melena or hematochezia.  Colonoscopy 5-6 years ago was negative.  Symptomatically, she feels good. 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 Exam is unremarkable.  Ferritin is 67.  Plan: 1.  Labs today:  CBC with diff, CMP, ferritin. 2.  Bilateral mammogram 07/12/2016. 3.  Patient to follow-up with PCP regarding tick bites. 4.  RTC in 1 year for MD assessment and labs (CBC with diff, CMP, ferritin).    Lequita Asal, MD  05/12/2016, 11:12 AM

## 2016-05-18 ENCOUNTER — Encounter: Payer: Self-pay | Admitting: Hematology and Oncology

## 2016-06-16 ENCOUNTER — Ambulatory Visit: Payer: Medicare Other | Attending: Hematology and Oncology

## 2016-07-15 ENCOUNTER — Other Ambulatory Visit: Payer: Self-pay | Admitting: Internal Medicine

## 2016-07-15 DIAGNOSIS — Z1231 Encounter for screening mammogram for malignant neoplasm of breast: Secondary | ICD-10-CM

## 2016-07-29 ENCOUNTER — Ambulatory Visit
Admission: RE | Admit: 2016-07-29 | Discharge: 2016-07-29 | Disposition: A | Discharge status: Home / Self Care | Payer: Medicare Other | Source: Ambulatory Visit | Attending: Internal Medicine | Admitting: Internal Medicine

## 2016-07-29 DIAGNOSIS — Z1231 Encounter for screening mammogram for malignant neoplasm of breast: Secondary | ICD-10-CM

## 2016-09-02 DIAGNOSIS — F330 Major depressive disorder, recurrent, mild: Secondary | ICD-10-CM | POA: Diagnosis not present

## 2016-09-24 DIAGNOSIS — Z23 Encounter for immunization: Secondary | ICD-10-CM | POA: Diagnosis not present

## 2016-11-17 ENCOUNTER — Ambulatory Visit (INDEPENDENT_AMBULATORY_CARE_PROVIDER_SITE_OTHER): Payer: Medicare Other | Admitting: Internal Medicine

## 2016-11-17 ENCOUNTER — Encounter: Payer: Self-pay | Admitting: Internal Medicine

## 2016-11-17 VITALS — BP 128/80 | HR 68 | Temp 98.3°F | Ht 60.0 in | Wt 141.0 lb

## 2016-11-17 DIAGNOSIS — Z0000 Encounter for general adult medical examination without abnormal findings: Secondary | ICD-10-CM | POA: Diagnosis not present

## 2016-11-17 DIAGNOSIS — G8929 Other chronic pain: Secondary | ICD-10-CM

## 2016-11-17 DIAGNOSIS — M545 Low back pain, unspecified: Secondary | ICD-10-CM

## 2016-11-17 DIAGNOSIS — G629 Polyneuropathy, unspecified: Secondary | ICD-10-CM | POA: Insufficient documentation

## 2016-11-17 DIAGNOSIS — Z7189 Other specified counseling: Secondary | ICD-10-CM | POA: Diagnosis not present

## 2016-11-17 DIAGNOSIS — F339 Major depressive disorder, recurrent, unspecified: Secondary | ICD-10-CM

## 2016-11-17 MED ORDER — GABAPENTIN 300 MG PO CAPS: 600.0000 mg | ORAL_CAPSULE | Freq: Two times a day (BID) | ORAL | 3 refills | Status: DC

## 2016-11-17 NOTE — Assessment & Plan Note (Signed)
Controlled with meds Continues with psychiatrist

## 2016-11-17 NOTE — Assessment & Plan Note (Signed)
This is better now No action needed

## 2016-11-17 NOTE — Progress Notes (Signed)
Pre visit review using our clinic review tool, if applicable. No additional management support is needed unless otherwise documented below in the visit note. 

## 2016-11-17 NOTE — Assessment & Plan Note (Signed)
See social history 

## 2016-11-17 NOTE — Assessment & Plan Note (Addendum)
In right hand---since RMSF Continues on the gabapentin

## 2016-11-17 NOTE — Progress Notes (Signed)
Subjective:    Patient ID: Adrienne [name], female    DOB: 02/22/1941, 75 y.o.   MRN: [id number]  HPI Here for Medicare wellness and follow up of chronic medical conditions Reviewed form and advanced directives Reviewed other doctors No tobacco Enjoys 1-2 [place] before dinner Some exercise--not much No falls Chronic depression is controlled Independent with instrumental ADLs No sig issues with memory  Adopted a dog Cat in renal failure--will just do supportive care Just out in [facility] to see a close friend  Did see Dr Mike Gip in May No new concerns with the breast cancer  Has trouble stretching right hand Had to relearn how to write after the [facility] 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 Not much set exercise Discussed adding  [place]  Current Outpatient Prescriptions on File Prior to Visit  Medication Sig Dispense Refill  . acetaminophen (TYLENOL) 500 MG tablet Take 1,000 mg by mouth every 6 (six) hours as needed for moderate pain.    Marland Kitchen aspirin 81 MG EC tablet Take 81 mg by mouth daily.      . Calcium Carbonate (CALCIUM 600 PO) Take 600 mg by mouth 2 (two) times daily.      . Cholecalciferol (VITAMIN D) 1000 UNITS capsule Take 1,000 Units by mouth daily.      . fish oil-omega-3 fatty acids 1000 MG capsule Take 1 g by mouth daily.    Marland Kitchen gabapentin (NEURONTIN) 300 MG capsule Take 2 capsules (600 mg total) by mouth 2 (two) times daily. 360 capsule 2  . Loperamide HCl (IMODIUM A-D PO) Take 1 capsule by mouth 2 (two) times daily.    Marland Kitchen LORazepam (ATIVAN) 1 MG tablet Take 0.5 mg by mouth at bedtime.     . Multiple Vitamins-Minerals (OCUVITE PO) Take 1 tablet by mouth 2 (two) times daily.     . phenelzine (NARDIL) 15 MG tablet Take 30  mg by mouth 2 (two) times daily.     . [DISCONTINUED] Omega-3-acid Ethyl Esters (LOVAZA PO) Take 2,000 mg by mouth 2 (two) times daily in the am and at bedtime..       No current facility-administered medications on file prior to visit.     Allergies  Allergen Reactions  . Ivp Dye [Iodinated Diagnostic Agents] Other (See Comments)    CARDIAC ARREST  . Penicillins Anaphylaxis  . Metoclopramide Hcl Anxiety and Rash    Past Medical History:  Diagnosis Date  . Anemia, unspecified   . Bipolar affective disorder ([place])   . Breast cancer ([facility])    left  . Bronchitis   . Depression   . Depressive disorder, not elsewhere classified   . Disorder of bone and cartilage, unspecified   . External hemorrhoids   . Genital herpes, unspecified   . IBS (irritable bowel syndrome)   . Lumbago   . Macular degeneration   . Spinal stenosis, unspecified region other than cervical     Past Surgical History:  Procedure Laterality Date  . ABDOMINAL HYSTERECTOMY     Total but cervix left  . bilateral tuboplasty    . BREAST LUMPECTOMY Left 6/09   Left; high grade carcinoma in situ  . DILATION AND CURETTAGE OF UTERUS     x 2  . MRI lumbar  05/2007   L4-L5 spinal stenosis  . RECTOCELE REPAIR    . TONSILLECTOMY      Family History  Problem Relation Age of Onset  . Pancreatic cancer Father   . Prostate cancer Father   . Alzheimer's disease Father   . Heart attack Father   . Ulcerative colitis Mother   . Breast cancer Mother   . Hypertension Brother   . Hyperlipidemia Brother   . Ulcerative colitis Daughter   . Colon cancer Neg Hx     Social History   Social History  . Marital status: Divorced    Spouse name: N/A  . Number of children: 1  . Years of education: N/A   Occupational History  . Med tech     Retired   Social History Main Topics  . Smoking status: Never Smoker  . Smokeless tobacco: Never Used  . Alcohol use 0.0 oz/week    10 - 14 Glasses of wine per week     Comment:  1 cocktail (manhattan) and 1 glass of wine daily  . Drug use: No  . Sexual activity: Not on file   Other Topics Concern  . Not on file   Social History Narrative   Has living will   Daughter is health care POA   Would accept resuscitation attempts   Probably wouldn't want tube feedings if cognitively aware   Body donation to [facility] already arranged         Review of Systems Appetite is good Weight is stable Sleeps well--dog sleeps with her Wears seat belt Teeth are okay--- regular with dentist [facility]) No chest pain or SOB No dizziness or syncope No edema No trouble with bowels or blood, Voids fine--- no problems. May have slight stress incontinence if she hold too long Dry skin on feet---no set ulcers or suspicious lesions    Objective:   Physical Exam  Constitutional: She is oriented to person, place, and time. She appears well-developed and well-nourished. No distress.  HENT:  Mouth/Throat: Oropharynx is clear and moist. No oropharyngeal exudate.  Eyes: Conjunctivae are normal. Pupils are equal, round, and reactive to light.  Neck: Normal range of motion. Neck supple. No thyromegaly present.  Cardiovascular: Normal rate, regular rhythm, normal heart sounds and intact distal pulses.  Exam reveals no gallop.   No murmur heard. Pulmonary/Chest: Effort normal and breath sounds normal. No respiratory distress. She has no wheezes. She has no rales.  Abdominal: Soft. There is no tenderness.  Musculoskeletal: She exhibits no edema or tenderness.  Lymphadenopathy:    She has no cervical adenopathy.  Neurological: She is alert and oriented to person, place, and time.  President--- "Dwaine Deter, [place]---- ?" [phone] [id number] Recall 3/3  Skin:  Dry skin on left heel---no ulcers  Psychiatric: She has a normal mood and affect. Her behavior is normal.          Assessment & Plan:

## 2016-11-17 NOTE — Assessment & Plan Note (Signed)
I have personally reviewed the Medicare Annual Wellness questionnaire and have noted 1. The patient's medical and social history 2. Their use of alcohol, tobacco or illicit drugs 3. Their current medications and supplements 4. The patient's functional ability including ADL's, fall risks, home safety risks and hearing or visual             impairment. 5. Diet and physical activities 6. Evidence for depression or mood disorders  The patients weight, height, BMI and visual acuity have been recorded in the chart I have made referrals, counseling and provided education to the patient based review of the above and I have provided the pt with a written personalized care plan for preventive services.  I have provided you with a copy of your personalized plan for preventive services. Please take the time to review along with your updated medication list.  UTD on imms Gets yearly follow up about breast cancer Colon would be due 2023---will probably not do this

## 2016-12-15 DIAGNOSIS — H2513 Age-related nuclear cataract, bilateral: Secondary | ICD-10-CM | POA: Diagnosis not present

## 2017-02-02 DIAGNOSIS — F330 Major depressive disorder, recurrent, mild: Secondary | ICD-10-CM | POA: Diagnosis not present

## 2017-02-13 ENCOUNTER — Telehealth: Payer: Self-pay | Admitting: Internal Medicine

## 2017-02-13 NOTE — Telephone Encounter (Signed)
Patient dropped off a form to be completed for hiking clearance. Form is in the Rx tower.

## 2017-02-16 NOTE — Telephone Encounter (Signed)
Form done No charge 

## 2017-02-16 NOTE — Telephone Encounter (Signed)
Spoke to pt. Form faxed. Mailed original to pt

## 2017-02-16 NOTE — Telephone Encounter (Signed)
Form in Dr Alla German inbox on his desk

## 2017-04-13 ENCOUNTER — Telehealth: Payer: Self-pay

## 2017-04-13 MED ORDER — DOXYCYCLINE HYCLATE 100 MG PO CAPS: 100.0000 mg | ORAL_CAPSULE | Freq: Two times a day (BID) | ORAL | 0 refills | Status: DC

## 2017-04-13 NOTE — Telephone Encounter (Signed)
Pt left v/m; pt has gotten off 7 ticks this season. Pt will be traveling in 2 weeks to [place] and pt request the previous abx given to pt when she had [place] spotted fever. Pt wants the rx to take with her to [place] in case pt gets sick; pt is going on group tour and will not have transportation to go anywhere while at [facility]. Pt request [facility]. [place].

## 2017-04-13 NOTE — Telephone Encounter (Signed)
Spoke to Adrienne [name] and advised per Dr Silvio Pate. Adrienne [name] states she will be sure to check herself, daily, thouroughly

## 2017-04-13 NOTE — Telephone Encounter (Signed)
Please let her know that I sent the prescription. The best way to avoid tick borne illness is to get them off ASAP. She should check her body fully every day when done with hiking or other activity and not start the med unless she doesn't feel right

## 2017-05-12 ENCOUNTER — Inpatient Hospital Stay: Payer: Medicare Other | Attending: Hematology and Oncology

## 2017-05-12 ENCOUNTER — Other Ambulatory Visit: Payer: Self-pay | Admitting: Hematology and Oncology

## 2017-05-12 ENCOUNTER — Encounter: Payer: Self-pay | Admitting: Hematology and Oncology

## 2017-05-12 ENCOUNTER — Other Ambulatory Visit: Payer: Self-pay

## 2017-05-12 ENCOUNTER — Other Ambulatory Visit: Payer: Self-pay | Admitting: *Deleted

## 2017-05-12 ENCOUNTER — Inpatient Hospital Stay (HOSPITAL_BASED_OUTPATIENT_CLINIC_OR_DEPARTMENT_OTHER): Payer: Medicare Other | Admitting: Hematology and Oncology

## 2017-05-12 VITALS — BP 166/72 | HR 89 | Temp 98.1°F | Wt 140.4 lb

## 2017-05-12 DIAGNOSIS — H3530 Unspecified macular degeneration: Secondary | ICD-10-CM | POA: Insufficient documentation

## 2017-05-12 DIAGNOSIS — Z171 Estrogen receptor negative status [ER-]: Secondary | ICD-10-CM | POA: Insufficient documentation

## 2017-05-12 DIAGNOSIS — D649 Anemia, unspecified: Secondary | ICD-10-CM

## 2017-05-12 DIAGNOSIS — Z853 Personal history of malignant neoplasm of breast: Secondary | ICD-10-CM

## 2017-05-12 DIAGNOSIS — C50912 Malignant neoplasm of unspecified site of left female breast: Secondary | ICD-10-CM

## 2017-05-12 DIAGNOSIS — Z780 Asymptomatic menopausal state: Secondary | ICD-10-CM

## 2017-05-12 DIAGNOSIS — Z1231 Encounter for screening mammogram for malignant neoplasm of breast: Secondary | ICD-10-CM

## 2017-05-12 DIAGNOSIS — Z923 Personal history of irradiation: Secondary | ICD-10-CM | POA: Diagnosis not present

## 2017-05-12 DIAGNOSIS — K589 Irritable bowel syndrome without diarrhea: Secondary | ICD-10-CM | POA: Insufficient documentation

## 2017-05-12 DIAGNOSIS — Z7982 Long term (current) use of aspirin: Secondary | ICD-10-CM | POA: Insufficient documentation

## 2017-05-12 DIAGNOSIS — F319 Bipolar disorder, unspecified: Secondary | ICD-10-CM | POA: Diagnosis not present

## 2017-05-12 DIAGNOSIS — D638 Anemia in other chronic diseases classified elsewhere: Secondary | ICD-10-CM

## 2017-05-12 LAB — CBC WITH DIFFERENTIAL/PLATELET
Basophils Absolute: 0 10*3/uL (ref 0–0.1)
Basophils Relative: 1 %
Eosinophils Absolute: 0.2 10*3/uL (ref 0–0.7)
Eosinophils Relative: 5 %
HCT: 31.5 % — ABNORMAL LOW (ref 35.0–47.0)
Hemoglobin: 10.7 g/dL — ABNORMAL LOW (ref 12.0–16.0)
Lymphocytes Relative: 32 %
Lymphs Abs: 1.4 10*3/uL (ref 1.0–3.6)
MCH: 29.2 pg (ref 26.0–34.0)
MCHC: 34 g/dL (ref 32.0–36.0)
MCV: 85.8 fL (ref 80.0–100.0)
Monocytes Absolute: 0.3 10*3/uL (ref 0.2–0.9)
Monocytes Relative: 7 %
Neutro Abs: 2.5 10*3/uL (ref 1.4–6.5)
Neutrophils Relative %: 55 %
Platelets: 321 10*3/uL (ref 150–440)
RBC: 3.67 MIL/uL — ABNORMAL LOW (ref 3.80–5.20)
RDW: 14.2 % (ref 11.5–14.5)
WBC: 4.5 10*3/uL (ref 3.6–11.0)

## 2017-05-12 LAB — COMPREHENSIVE METABOLIC PANEL
ALT: 19 U/L (ref 14–54)
AST: 34 U/L (ref 15–41)
Albumin: 3.9 g/dL (ref 3.5–5.0)
Alkaline Phosphatase: 49 U/L (ref 38–126)
Anion gap: 5 (ref 5–15)
BUN: 10 mg/dL (ref 6–20)
CO2: 32 mmol/L (ref 22–32)
Calcium: 9.5 mg/dL (ref 8.9–10.3)
Chloride: 104 mmol/L (ref 101–111)
Creatinine, Ser: 0.84 mg/dL (ref 0.44–1.00)
GFR calc Af Amer: >60 mL/min (ref >60)
GFR calc non Af Amer: >60 mL/min (ref >60)
Glucose, Bld: 99 mg/dL (ref 65–99)
Potassium: 3.8 mmol/L (ref 3.5–5.1)
Sodium: 141 mmol/L (ref 135–145)
Total Bilirubin: 0.5 mg/dL (ref 0.3–1.2)
Total Protein: 6.7 g/dL (ref 6.5–8.1)

## 2017-05-12 LAB — FERRITIN: Ferritin: 77 ng/mL (ref 11–307)

## 2017-05-12 NOTE — Progress Notes (Signed)
[facility] day:  05/12/17  Chief Complaint: Adrienne [name] is a 76 y.o. female with a history of DCIS and a normocytic anemia who is seen for 1 year assessment.  HPI: The patient was last seen in the medical oncology clinic on 05/12/2016.  At that time, she felt good. 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 Exam was unremarkable.  Ferritin was 67.  Bilateral screeing mammogram on 07/29/2016 revealed no findings suspicious for malignancy.  During the interim, she has has had more ticks.  She denies any concerns.  She denies any pain.   Past Medical History:  Diagnosis Date  . Anemia, unspecified   . Bipolar affective disorder ([place])   . Breast cancer ([place])    left  . Bronchitis   . Depression   . Depressive disorder, not elsewhere classified   . Disorder of bone and cartilage, unspecified   . External hemorrhoids   . Genital herpes, unspecified   . IBS (irritable bowel syndrome)   . Lumbago   . Macular degeneration   . Spinal stenosis, unspecified region other than cervical     Past Surgical History:  Procedure Laterality Date  . ABDOMINAL HYSTERECTOMY     Total but cervix left  . bilateral tuboplasty    . BREAST LUMPECTOMY Left 6/09   Left; high grade carcinoma in situ  . DILATION AND CURETTAGE OF UTERUS     x 2  . MRI lumbar  05/2007   L4-L5 spinal stenosis  . RECTOCELE REPAIR    . TONSILLECTOMY      Family History  Problem Relation Age of Onset  . Pancreatic cancer Father   . Prostate cancer Father   . Alzheimer's disease Father   . Heart attack Father   . Ulcerative colitis Mother   . Breast cancer Mother   . Hypertension Brother   . Hyperlipidemia Brother   . Ulcerative colitis Daughter   . Colon cancer Neg Hx     Social History:  reports that she has never smoked. She has never used smokeless tobacco. She reports that she drinks alcohol. She reports that she does not use drugs.  The  patient lives in [place] in [place].. She states that she is into "everything". She is a Hydrographic surveyor. She has a life at [place].  She may have a drink before dinner. The patient is alone today.  Allergies:  Allergies  Allergen Reactions  . Ivp Dye [Iodinated Diagnostic Agents] Other (See Comments)    CARDIAC ARREST  . Penicillins Anaphylaxis  . Metoclopramide Hcl Anxiety and Rash    Current Medications: Current Outpatient Prescriptions  Medication Sig Dispense Refill  . acetaminophen (TYLENOL) 500 MG tablet Take 1,000 mg by mouth every 6 (six) hours as needed for moderate pain.    Marland Kitchen aspirin 81 MG EC tablet Take 81 mg by mouth daily.      . Calcium Carbonate (CALCIUM 600 PO) Take 600 mg by mouth 2 (two) times daily.      . Cholecalciferol (VITAMIN D) 1000 UNITS capsule Take 1,000 Units by mouth daily.      . fish oil-omega-3 fatty acids 1000 MG capsule Take 1 g by mouth daily.    Marland Kitchen gabapentin (NEURONTIN) 300 MG capsule Take 2 capsules (600 mg total) by mouth 2 (two) times daily. 360 capsule 3  . Loperamide HCl (IMODIUM A-D PO) Take 1 capsule by mouth 2 (two) times daily.    Marland Kitchen  LORazepam (ATIVAN) 1 MG tablet Take 0.5 mg by mouth at bedtime.     . Multiple Vitamins-Minerals (OCUVITE PO) Take 1 tablet by mouth 2 (two) times daily.     . phenelzine (NARDIL) 15 MG tablet Take 30 mg by mouth 2 (two) times daily.      No current facility-administered medications for this visit.     Review of Systems:  GENERAL:  Feels good.  Active.  No fevers or sweats.  Weight down 1 pound. PERFORMANCE STATUS (ECOG):  0 HEENT:  No visual changes, runny nose, sore throat, mouth sores or tenderness. Lungs: No shortness of breath or cough.  No hemoptysis. Cardiac:  No chest pain, palpitations, orthopnea, or PND. GI:  No nausea, vomiting, diarrhea, constipation, melena or hematochezia. GU:  No urgency, frequency, dysuria, or hematuria. Musculoskeletal:  No back pain.  No joint pain.  No muscle  tenderness. Extremities:  No pain or swelling. Skin:  No rashes or skin changes. Neuro:  No headache, numbness or weakness, balance or coordination issues. Endocrine:  No diabetes, thyroid issues, hot flashes or night sweats. Psych:  No mood changes, depression or anxiety. Pain:  No focal pain. Review of systems:  All other systems reviewed and found to be negative.  Physical Exam: Blood pressure (!) 166/72, pulse 89, temperature 98.1 F (36.7 C), temperature source Tympanic, weight 140 lb 6.4 oz (63.7 kg). GENERAL:  Well developed, well nourished, woman sitting comfortably in the exam room in no acute distress. MENTAL STATUS:  Alert and oriented to person, place and time. HEAD:  Pearline Cables hair.  Normocephalic, atraumatic, face symmetric, no Cushingoid features. EYES:  Glasses. Brown eyes.  Pupils equal round and reactive to light and accomodation.  No conjunctivitis or scleral icterus. ENT:  Oropharynx clear without lesion.  Tongue normal. Mucous membranes moist.  RESPIRATORY:  Clear to auscultation without rales, wheezes or rhonchi. CARDIOVASCULAR:  Regular rate and rhythm without murmur, rub or gallop. BREASTS:  Right breast without masses, skin changes or nipple discharge.  Fibrocystic changes.  Left breast with post-operative changes at the 5 o'clock position.  Inverted nipple (chronic).  No masses, skin changes or nipple discharge.  ABDOMEN:  Soft, non-tender, with active bowel sounds, and no hepatosplenomegaly.  No masses. SKIN:  No rashes, ulcers or lesions. EXTREMITIES: No edema, no skin discoloration or tenderness.  No palpable cords. LYMPH NODES: No palpable cervical, supraclavicular, axillary or inguinal adenopathy  NEUROLOGICAL: Unremarkable. PSYCH:  Appropriate.   Orders Only on 05/12/2017  Component Date Value Ref Range Status  . WBC 05/12/2017 4.5  3.6 - 11.0 K/uL Final  . RBC 05/12/2017 3.67* 3.80 - 5.20 MIL/uL Final  . Hemoglobin 05/12/2017 10.7* 12.0 - 16.0 g/dL Final   . HCT 05/12/2017 31.5* 35.0 - 47.0 % Final  . MCV 05/12/2017 85.8  80.0 - 100.0 fL Final  . MCH 05/12/2017 29.2  26.0 - 34.0 pg Final  . MCHC 05/12/2017 34.0  32.0 - 36.0 g/dL Final  . RDW 05/12/2017 14.2  11.5 - 14.5 % Final  . Platelets 05/12/2017 321  150 - 440 K/uL Final  . Neutrophils Relative % 05/12/2017 55  % Final  . Neutro Abs 05/12/2017 2.5  1.4 - 6.5 K/uL Final  . Lymphocytes Relative 05/12/2017 32  % Final  . Lymphs Abs 05/12/2017 1.4  1.0 - 3.6 K/uL Final  . Monocytes Relative 05/12/2017 7  % Final  . Monocytes Absolute 05/12/2017 0.3  0.2 - 0.9 K/uL Final  . Eosinophils Relative 05/12/2017  5  % Final  . Eosinophils Absolute 05/12/2017 0.2  0 - 0.7 K/uL Final  . Basophils Relative 05/12/2017 1  % Final  . Basophils Absolute 05/12/2017 0.0  0 - 0.1 K/uL Final  . Sodium 05/12/2017 141  135 - 145 mmol/L Final  . Potassium 05/12/2017 3.8  3.5 - 5.1 mmol/L Final  . Chloride 05/12/2017 104  101 - 111 mmol/L Final  . CO2 05/12/2017 32  22 - 32 mmol/L Final  . Glucose, Bld 05/12/2017 99  65 - 99 mg/dL Final  . BUN 05/12/2017 10  6 - 20 mg/dL Final  . Creatinine, Ser 05/12/2017 0.84  0.44 - 1.00 mg/dL Final  . Calcium 05/12/2017 9.5  8.9 - 10.3 mg/dL Final  . Total Protein 05/12/2017 6.7  6.5 - 8.1 g/dL Final  . Albumin 05/12/2017 3.9  3.5 - 5.0 g/dL Final  . AST 05/12/2017 34  15 - 41 U/L Final  . ALT 05/12/2017 19  14 - 54 U/L Final  . Alkaline Phosphatase 05/12/2017 49  38 - 126 U/L Final  . Total Bilirubin 05/12/2017 0.5  0.3 - 1.2 mg/dL Final  . GFR calc non Af Amer 05/12/2017 >60  >60 mL/min Final  . GFR calc Af Amer 05/12/2017 >60  >60 mL/min Final   Comment: (NOTE) The eGFR has been calculated using the CKD EPI equation. This calculation has not been validated in all clinical situations. eGFR's persistently <60 mL/min signify possible Chronic Kidney Disease.   . Anion gap 05/12/2017 5  5 - 15 Final    Assessment:  BAILLIE MOHAMMAD is a 76 y.o. 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 Margins were negative. Estrogen receptor was 0% and progesterone receptor 0%.  She received radiation post lumpectomy.  Bilateral screeing mammogram on 07/29/2016 revealed no findings suspicious for malignancy.  She has a longstanding history of a mild normocytic normochromic anemia. Hemoglobin has ranged between 10.6 - 10.9.  She has no evidence of renal insufficiency. Workup for multiple myeloma was negative. She denies any melena or hematochezia.  Colonoscopy 5-6 years ago was negative.  Diet is good.  Ferritin was 67 on 05/12/2016 and 77 on 05/12/2017.  Symptomatically, she feels good. 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 Exam is unremarkable.  Ferritin is 77.  Plan: 1.  Labs today:  CBC with diff, CMP, ferritin. 2.  Review interim mammogram on 07/29/2016.  No evidence of malignancy. 3.  Schedule bilateral mammogram 07/29/2017. 4.  RTC in 1 year for MD assessment and labs (CBC with diff, CMP, ferritin).    Lequita Asal, MD  05/12/2017, 11:04 AM

## 2017-06-12 ENCOUNTER — Other Ambulatory Visit: Payer: Medicare Other

## 2017-06-12 ENCOUNTER — Ambulatory Visit: Payer: Medicare Other | Admitting: Hematology and Oncology

## 2017-07-06 DIAGNOSIS — F330 Major depressive disorder, recurrent, mild: Secondary | ICD-10-CM | POA: Diagnosis not present

## 2017-07-31 ENCOUNTER — Ambulatory Visit
Admission: RE | Admit: 2017-07-31 | Discharge: 2017-07-31 | Disposition: A | Discharge status: Home / Self Care | Payer: Medicare Other | Source: Ambulatory Visit | Attending: Hematology and Oncology | Admitting: Hematology and Oncology

## 2017-07-31 DIAGNOSIS — Z1231 Encounter for screening mammogram for malignant neoplasm of breast: Secondary | ICD-10-CM

## 2017-07-31 HISTORY — DX: Personal history of irradiation: Z92.3

## 2017-08-25 ENCOUNTER — Telehealth: Payer: Self-pay | Admitting: Internal Medicine

## 2017-08-25 NOTE — Telephone Encounter (Signed)
Pt has appt with Dr Deborra Medina on 08/26/17 at 9 AM.

## 2017-08-25 NOTE — Telephone Encounter (Signed)
Patient Name: Adrienne [name]  DOB: 04/28/1941    Initial Comment Caller states [place], she was told not to drink water from the faucet, she did drink the water and now has horrible diarrhea.    Nurse Assessment  Nurse: Thad Ranger RN, Denise Date/Time (Eastern Time): 08/25/2017 2:19:55 PM  Confirm and document reason for call. If symptomatic, describe symptoms. ---Caller states she was [place], she was told not to drink water from the faucet, she did drink the water and now has horrible diarrhea x 8 days.  Does the patient have any new or worsening symptoms? ---Yes  Will a triage be completed? ---Yes  Related visit to physician within the last 2 weeks? ---No  Does the PT have any chronic conditions? (i.e. diabetes, asthma, etc.) ---No  Is this a behavioral health or substance abuse call? ---No     Guidelines    Guideline Title Affirmed Question Affirmed Notes  Diarrhea [1] MILD diarrhea (e.g., 1-3 or more stools than normal in past 24 hours) without known cause AND [2] present > 7 days    Final Disposition User   See PCP When Office is Open (within 3 days) Carmon, RN, Denise    Comments  Appt made for 08/26/17 @ 0900 w/Dr Arnette Norris. Pt aware/agreeable.   Referrals  REFERRED TO PCP OFFICE  REFERRED TO PCP OFFICE   Disagree/Comply: Comply

## 2017-08-26 ENCOUNTER — Encounter: Payer: Self-pay | Admitting: Family Medicine

## 2017-08-26 ENCOUNTER — Ambulatory Visit (INDEPENDENT_AMBULATORY_CARE_PROVIDER_SITE_OTHER): Payer: Medicare Other | Admitting: Family Medicine

## 2017-08-26 DIAGNOSIS — A09 Infectious gastroenteritis and colitis, unspecified: Secondary | ICD-10-CM | POA: Insufficient documentation

## 2017-08-26 NOTE — Patient Instructions (Signed)
Great to see you. Start a probiotic daily.  Stop by the lab.

## 2017-08-26 NOTE — Progress Notes (Signed)
.  Subjective:   Patient ID: Adrienne [name], female    DOB: Jul 26, 1941, 76 y.o.   MRN: [id number]  Adrienne [name] is a pleasant 76 y.o. year old pleasant female patient of Dr. Silvio Pate, new to me, who presents to clinic today with Acute Visit (diarrhea x 8 days)  on 08/26/2017  HPI:  Just returned from a trip to the [place] with her family.  Returned home on 08/21/17.  Two days after returning, had an episode of diarrhea- could not make it to the bathroom.  Stool was loose but not watery.  Took some imodium and felt she was better.  But then two days ago, two urgent and loose stools again.  She drank the water and was told not to at the cabin.  No fevers. No blood in her stool.  No nausea.   Actually feels better today even without imodium.  Last BM was yesterday.  Current Outpatient Prescriptions on File Prior to Visit  Medication Sig Dispense Refill  . acetaminophen (TYLENOL) 500 MG tablet Take 1,000 mg by mouth every 6 (six) hours as needed for moderate pain.    Marland Kitchen aspirin 81 MG EC tablet Take 81 mg by mouth daily.      . Calcium Carbonate (CALCIUM 600 PO) Take 600 mg by mouth 2 (two) times daily.      . Cholecalciferol (VITAMIN D) 1000 UNITS capsule Take 1,000 Units by mouth daily.      . fish oil-omega-3 fatty acids 1000 MG capsule Take 1 g by mouth daily.    Marland Kitchen gabapentin (NEURONTIN) 300 MG capsule Take 2 capsules (600 mg total) by mouth 2 (two) times daily. 360 capsule 3  . Loperamide HCl (IMODIUM A-D PO) Take 1 capsule by mouth 2 (two) times daily.    Marland Kitchen LORazepam (ATIVAN) 1 MG tablet Take 0.5 mg by mouth at bedtime.     . Multiple Vitamins-Minerals (OCUVITE PO) Take 1 tablet by mouth 2 (two) times daily.     . phenelzine (NARDIL) 15 MG tablet Take 30 mg by mouth 2 (two) times daily.     . [DISCONTINUED] Omega-3-acid Ethyl Esters (LOVAZA PO) Take 2,000 mg by mouth 2 (two) times daily in the am and at bedtime..       No current facility-administered medications on file  prior to visit.     Allergies  Allergen Reactions  . Ivp Dye [Iodinated Diagnostic Agents] Other (See Comments)    CARDIAC ARREST  . Penicillins Anaphylaxis  . Metoclopramide Hcl Anxiety and Rash    Past Medical History:  Diagnosis Date  . Anemia, unspecified   . Bipolar affective disorder ([place])   . Breast cancer ([place])    left  . Bronchitis   . Depression   . Depressive disorder, not elsewhere classified   . Disorder of bone and cartilage, unspecified   . External hemorrhoids   . Genital herpes, unspecified   . IBS (irritable bowel syndrome)   . Lumbago   . Macular degeneration   . Personal history of radiation therapy 2009  . Spinal stenosis, unspecified region other than cervical     Past Surgical History:  Procedure Laterality Date  . ABDOMINAL HYSTERECTOMY     Total but cervix left  . bilateral tuboplasty    . BREAST LUMPECTOMY Left 6/09   Left; high grade carcinoma in situ  . DILATION AND CURETTAGE OF UTERUS     x 2  . MRI lumbar  05/2007   L4-L5 spinal  stenosis  . RECTOCELE REPAIR    . TONSILLECTOMY      Family History  Problem Relation Age of Onset  . Pancreatic cancer Father   . Prostate cancer Father   . Alzheimer's disease Father   . Heart attack Father   . Ulcerative colitis Mother   . Breast cancer Mother   . Hypertension Brother   . Hyperlipidemia Brother   . Ulcerative colitis Daughter   . Colon cancer Neg Hx     Social History   Social History  . Marital status: Divorced    Spouse name: N/A  . Number of children: 1  . Years of education: N/A   Occupational History  . Med tech     Retired   Social History Main Topics  . Smoking status: Never Smoker  . Smokeless tobacco: Never Used  . Alcohol use 0.0 oz/week    10 - 14 Glasses of wine per week     Comment: 1 cocktail (manhattan) and 1 glass of wine daily  . Drug use: No  . Sexual activity: Not on file   Other Topics Concern  . Not on file   Social History Narrative   Has  living will   Daughter is health care POA   Would accept resuscitation attempts   Probably wouldn't want tube feedings if cognitively aware   Body donation to [facility] already arranged         The PMH, [place], Social History, Family History, Medications, and allergies have been reviewed in [facility], and have been updated if relevant.   Review of Systems  Constitutional: Negative.   Gastrointestinal: Positive for diarrhea. Negative for abdominal distention, abdominal pain, anal bleeding, blood in stool, constipation, nausea, rectal pain and vomiting.  All other systems reviewed and are negative.      Objective:    BP 140/84   Pulse 77   Temp 98.9 F (37.2 C)   Ht 5' (1.524 m)   Wt 137 lb (62.1 kg)   SpO2 96%   BMI 26.76 kg/m    Physical Exam   General:  Well-developed,well-nourished,in no acute distress; alert,appropriate and cooperative throughout examination Head:  normocephalic and atraumatic.   Eyes:  vision grossly intact, PERRL Ears:  R ear normal and L ear normal externally, TMs clear bilaterally Nose:  no external deformity.   Mouth:  good dentition.   Neck:  No deformities, masses, or tenderness noted. Heart:  Normal rate and regular rhythm. S1 and S2 normal without gallop, murmur, click, rub or other extra sounds. Abdomen:  Bowel sounds positive,abdomen soft and non-tender without masses, organomegaly or hernias noted. Msk:  No deformity or scoliosis noted of thoracic or lumbar spine.   Extremities:  No clubbing, cyanosis, edema, or deformity noted with normal full range of motion of all joints.   Neurologic:  alert & oriented X3 and gait normal.   Skin:  Intact without suspicious lesions or rashes Psych:  Cognition and judgment appear intact. Alert and cooperative with normal attention span and concentration. No apparent delusions, illusions, hallucinations       Assessment & Plan:   Infectious diarrhea - Plan: Stool culture, CANCELED: Stool culture No  Follow-up on file.

## 2017-08-26 NOTE — Assessment & Plan Note (Signed)
New- no red flag symptoms and exam reassuring. Also appears to be resolving.  Stools are non bloody, not watery and no fever. We agreed she could try a probiotic for the next couple of weeks, push fluids and keep [place] updated if symptoms are worsening or not resolving over the next day or two. The patient indicates understanding of these issues and agrees with the plan.

## 2017-09-04 ENCOUNTER — Encounter: Payer: Self-pay | Admitting: Family Medicine

## 2017-09-04 ENCOUNTER — Ambulatory Visit (INDEPENDENT_AMBULATORY_CARE_PROVIDER_SITE_OTHER): Payer: Medicare Other | Admitting: Family Medicine

## 2017-09-04 DIAGNOSIS — R197 Diarrhea, unspecified: Secondary | ICD-10-CM | POA: Diagnosis not present

## 2017-09-04 NOTE — Patient Instructions (Signed)
Restart the probiotic, get the stool culture done and update [place] in a few days.  If worse in the meantime (more abdominal pain, fever, blood in stool, etc), then let us know sooner rather than later. Take care.  Glad to see you.

## 2017-09-04 NOTE — Progress Notes (Signed)
She had to put her cat down yesterday.  I offered my condolences.    She took probtiotics after prev episode of diarrhea.  She got better.  Had about 10 days w/o diarrhea, was able to stop the probiotic but then had diarrhea again, similar to prev.    No one else has been sick.  No blood in stool.  She has had some R sided abdominal pain since July but today is better.  She would have a pain with reaching above her head, like she had a strained abd wall muscle- she could have positional pain with laying in bed.  Last diarrhea was this AM.  No mucous in the stool.    Meds, vitals, and allergies reviewed.   ROS: Per HPI unless specifically indicated in ROS section   GEN: nad, alert and oriented HEENT: mucous membranes moist NECK: supple w/o LA CV: rrr.  PULM: ctab, no inc wob ABD: soft, +bs EXT: no edema SKIN: no acute rash

## 2017-09-05 DIAGNOSIS — R197 Diarrhea, unspecified: Secondary | ICD-10-CM | POA: Insufficient documentation

## 2017-09-05 NOTE — Assessment & Plan Note (Signed)
Reasonable to restart probiotic, check stool culture. Discussed with patient. She had been traveling, could have had exposure to Giardia or some other enteric pathogen. Still well-appearing. Would not start antimicrobial tx at this point. See after visit summary. She agrees. Okay for outpatient follow-up.

## 2017-09-07 ENCOUNTER — Telehealth: Payer: Self-pay | Admitting: Internal Medicine

## 2017-09-07 NOTE — Telephone Encounter (Signed)
Noted, we'll evaluate.

## 2017-09-07 NOTE — Telephone Encounter (Signed)
Pt has appt with Allie Bossier NP on 09/08/17 at 8:45.

## 2017-09-07 NOTE — Telephone Encounter (Signed)
[facility]  Patient Name: Adrienne [name]  DOB: 24-Nov-1941    Initial Comment blood in urine   Nurse Assessment  Nurse: Harlow Mares, RN, Suanne Marker Date/Time (Eastern Time): 09/07/2017 9:02:45 AM  Confirm and document reason for call. If symptomatic, describe symptoms. ---blood in urine. Saw MD on Friday for diarrhea which stopped over the weekend. Yesterday morning she began having blood in her urine. Reports no pain in flank/back.  Does the patient have any new or worsening symptoms? ---Yes  Will a triage be completed? ---Yes  Related visit to physician within the last 2 weeks? ---Yes  Does the PT have any chronic conditions? (i.e. diabetes, asthma, etc.) ---Yes  List chronic conditions. ---depression  Is this a behavioral health or substance abuse call? ---No     Guidelines    Guideline Title Affirmed Question Affirmed Notes  Urine - Blood In Passing pure blood or large blood clots (i.e., size > a dime) (Exception: fleck or small strands)    Final Disposition User   Go to ED Now Harlow Mares, RN, Suanne Marker    Comments  No available appts today at [facility], caller refused to go to another clinic/ucc/ed, requested an appt for tomorrow. Appt made with Alma Friendly tomorrow at 8:30am. Caller voiced understanding.   Referrals  GO TO FACILITY REFUSED   Disagree/Comply: Disagree  Disagree/Comply Reason: Wait and see

## 2017-09-08 ENCOUNTER — Ambulatory Visit (INDEPENDENT_AMBULATORY_CARE_PROVIDER_SITE_OTHER): Payer: Medicare Other | Admitting: Primary Care

## 2017-09-08 ENCOUNTER — Encounter: Payer: Self-pay | Admitting: Primary Care

## 2017-09-08 VITALS — BP 128/72 | HR 84 | Temp 98.1°F | Ht 60.0 in | Wt 138.8 lb

## 2017-09-08 DIAGNOSIS — A09 Infectious gastroenteritis and colitis, unspecified: Secondary | ICD-10-CM

## 2017-09-08 DIAGNOSIS — R310 Gross hematuria: Secondary | ICD-10-CM

## 2017-09-08 LAB — POC URINALSYSI DIPSTICK (AUTOMATED)
Bilirubin, UA: NEGATIVE
GLUCOSE UA: NEGATIVE
KETONES UA: NEGATIVE
Leukocytes, UA: NEGATIVE
Nitrite, UA: NEGATIVE
SPEC GRAV UA: 1.015 (ref 1.010–1.025)
Urobilinogen, UA: 0.2 E.U./dL
pH, UA: 7 (ref 5.0–8.0)

## 2017-09-08 NOTE — Progress Notes (Signed)
Subjective:    Patient ID: Adrienne [name], female    DOB: 1941-05-26, 76 y.o.   MRN: [id number]  HPI  Adrienne [name] is a 76 year old female with a history of infectious diarrhea, breast cancer, chronic low back pain who presents today with a chief complaint of hematuria. Evaluated by PCP three days ago for a 10 day history of diarrhea. Diarrhea not presumed to be infectious, no antibiotics required, stool culture pending.  She called into team health yesterday with reports of hematuria. She's noticed the toilet bowl water being bright red, worse last night. She first noticed the hematuria 2-3 days ago. She's not experienced diarrhea for four days. She has noticed mid lower back pain. She denies urinary frequency, dysuria, fevers, vaginal discharge, vaginal itching, abdominal pain, nausea/vomiting. History of hysterectomy years ago. History of kidney stones 20 years ago, this feels similar.   Review of Systems  Constitutional: Negative for fatigue and fever.  Gastrointestinal: Negative for abdominal pain, constipation, diarrhea and nausea.  Genitourinary: Positive for hematuria. Negative for dysuria, flank pain and vaginal discharge.  Musculoskeletal: Positive for back pain.       Past Medical History:  Diagnosis Date  . Anemia, unspecified   . Bipolar affective disorder ([place])   . Breast cancer ([place])    left  . Bronchitis   . Depression   . Depressive disorder, not elsewhere classified   . Disorder of bone and cartilage, unspecified   . External hemorrhoids   . Genital herpes, unspecified   . IBS (irritable bowel syndrome)   . Lumbago   . Macular degeneration   . Personal history of radiation therapy 2009  . Spinal stenosis, unspecified region other than cervical      Social History   Social History  . Marital status: Divorced    Spouse name: N/A  . Number of children: 1  . Years of education: N/A   Occupational History  . Med tech     Retired   Social History Main  Topics  . Smoking status: Never Smoker  . Smokeless tobacco: Never Used  . Alcohol use 0.0 oz/week    10 - 14 Glasses of wine per week     Comment: 1 cocktail (manhattan) and 1 glass of wine daily  . Drug use: No  . Sexual activity: Not on file   Other Topics Concern  . Not on file   Social History Narrative   Has living will   Daughter is health care POA   Would accept resuscitation attempts   Probably wouldn't want tube feedings if cognitively aware   Body donation to [facility] already arranged          Past Surgical History:  Procedure Laterality Date  . ABDOMINAL HYSTERECTOMY     Total but cervix left  . bilateral tuboplasty    . BREAST LUMPECTOMY Left 6/09   Left; high grade carcinoma in situ  . DILATION AND CURETTAGE OF UTERUS     x 2  . MRI lumbar  05/2007   L4-L5 spinal stenosis  . RECTOCELE REPAIR    . TONSILLECTOMY      Family History  Problem Relation Age of Onset  . Pancreatic cancer Father   . Prostate cancer Father   . Alzheimer's disease Father   . Heart attack Father   . Ulcerative colitis Mother   . Breast cancer Mother   . Hypertension Brother   . Hyperlipidemia Brother   . Ulcerative colitis Daughter   .  Colon cancer Neg Hx     Allergies  Allergen Reactions  . Ivp Dye [Iodinated Diagnostic Agents] Other (See Comments)    CARDIAC ARREST  . Penicillins Anaphylaxis  . Metoclopramide Hcl Anxiety and Rash    Current Outpatient Prescriptions on File Prior to Visit  Medication Sig Dispense Refill  . acetaminophen (TYLENOL) 500 MG tablet Take 1,000 mg by mouth every 6 (six) hours as needed for moderate pain.    Marland Kitchen aspirin 81 MG EC tablet Take 81 mg by mouth daily.      . Calcium Carbonate (CALCIUM 600 PO) Take 600 mg by mouth 2 (two) times daily.      . Cholecalciferol (VITAMIN D) 1000 UNITS capsule Take 1,000 Units by mouth daily.      . fish oil-omega-3 fatty acids 1000 MG capsule Take 1 g by mouth daily.    Marland Kitchen gabapentin (NEURONTIN) 300 MG  capsule Take 2 capsules (600 mg total) by mouth 2 (two) times daily. 360 capsule 3  . Loperamide HCl (IMODIUM A-D PO) Take 1 capsule by mouth 2 (two) times daily.    Marland Kitchen LORazepam (ATIVAN) 1 MG tablet Take 0.5 mg by mouth at bedtime.     . Multiple Vitamins-Minerals (OCUVITE PO) Take 1 tablet by mouth 2 (two) times daily.     . phenelzine (NARDIL) 15 MG tablet Take 30 mg by mouth 2 (two) times daily.     . [DISCONTINUED] Omega-3-acid Ethyl Esters (LOVAZA PO) Take 2,000 mg by mouth 2 (two) times daily in the am and at bedtime..       No current facility-administered medications on file prior to visit.     BP 128/72   Pulse 84   Temp 98.1 F (36.7 C) (Oral)   Ht 5' (1.524 m)   Wt 138 lb 12.8 oz (63 kg)   SpO2 95%   BMI 27.11 kg/m    Objective:   Physical Exam  Constitutional: She appears well-nourished.  Neck: Neck supple.  Cardiovascular: Normal rate.   Pulmonary/Chest: Effort normal.  Abdominal: Soft. Normal appearance and bowel sounds are normal. There is no tenderness. There is no CVA tenderness.  Skin: Skin is warm and dry.          Assessment & Plan:  Hematuria:  Present for 2-3 days. Other than mid low back pain, no other symptoms. Exam grossly unremarkable. She appears well and is not in distress.  UA today: 3+ blood, negative nitrites, negative leukocytes. Given history of renal stones, will check renal CT study. If negative then consider urology referral given gross hematuria.   Sheral Flow, NP

## 2017-09-08 NOTE — Patient Instructions (Signed)
Stop by the front desk and speak with either Rosaria Ferries or Shirlean Mylar regarding your CT scan. I'll be in touch once I receive the results.  Please notify me if you develop abdominal pain, increased back pain, difficulty urinating, fevers.  Push intake of water.  It was a pleasure meeting you!

## 2017-09-08 NOTE — Addendum Note (Signed)
Addended by: Ellamae Sia on: 09/08/2017 02:34 PM   Modules accepted: Orders

## 2017-09-08 NOTE — Addendum Note (Signed)
Addended by: Jacqualin Combes on: 09/08/2017 10:51 AM   Modules accepted: Orders

## 2017-09-09 ENCOUNTER — Telehealth: Payer: Self-pay

## 2017-09-09 DIAGNOSIS — R197 Diarrhea, unspecified: Secondary | ICD-10-CM

## 2017-09-09 NOTE — Telephone Encounter (Signed)
Please notify patient that a stool culture was only ordered.  Brigitte Pulse, do you think it's reasonable to add on c-diff or is culture good enough for now?

## 2017-09-09 NOTE — Telephone Encounter (Signed)
Pt left /vm requesting cb; pt brought stool specimen in on 09/08/17; pt wants to make sure that stool culture and c diff was ordered as well. pts daughter had c diff and that caused her colon to rupture.Please advise.

## 2017-09-09 NOTE — Telephone Encounter (Signed)
No

## 2017-09-09 NOTE — Telephone Encounter (Signed)
Adrienne [name], can we add C. Difficile to her current stool specimen?

## 2017-09-10 NOTE — Telephone Encounter (Signed)
Clark- C diff is less likely but not unreasonable to check.  I put in the order.  Thanks.  Adrienne [name]- please notify pt I added on C diff test.  Have her come by to pick up a kit.  Thanks.

## 2017-09-10 NOTE — Telephone Encounter (Signed)
Pt to pick up containers, pt called

## 2017-09-10 NOTE — Telephone Encounter (Signed)
Patient advised and will come by for the C.Diff test.

## 2017-09-12 LAB — STOOL CULTURE
MICRO NUMBER: 81000071
MICRO NUMBER:: 81000069
MICRO NUMBER:: 81000070
SHIGA RESULT:: NOT DETECTED
SPECIMEN QUALITY: ADEQUATE
SPECIMEN QUALITY:: ADEQUATE
SPECIMEN QUALITY:: ADEQUATE

## 2017-09-15 ENCOUNTER — Ambulatory Visit: Payer: Medicare Other | Attending: Primary Care

## 2017-09-21 ENCOUNTER — Telehealth: Payer: Self-pay | Admitting: Internal Medicine

## 2017-09-21 NOTE — Telephone Encounter (Signed)
Pt called checking on her stool  Culture results  She would like a copy of this also  Best number [phone]

## 2017-09-21 NOTE — Telephone Encounter (Signed)
Results are negative, mailed results.

## 2017-09-28 ENCOUNTER — Telehealth: Payer: Self-pay

## 2017-09-28 ENCOUNTER — Ambulatory Visit
Admission: RE | Admit: 2017-09-28 | Discharge: 2017-09-28 | Disposition: A | Discharge status: Home / Self Care | Payer: Medicare Other | Source: Ambulatory Visit | Attending: Primary Care | Admitting: Primary Care

## 2017-09-28 DIAGNOSIS — N132 Hydronephrosis with renal and ureteral calculous obstruction: Secondary | ICD-10-CM | POA: Insufficient documentation

## 2017-09-28 DIAGNOSIS — R109 Unspecified abdominal pain: Secondary | ICD-10-CM | POA: Diagnosis not present

## 2017-09-28 DIAGNOSIS — M48061 Spinal stenosis, lumbar region without neurogenic claudication: Secondary | ICD-10-CM | POA: Diagnosis not present

## 2017-09-28 DIAGNOSIS — I700 Atherosclerosis of aorta: Secondary | ICD-10-CM | POA: Insufficient documentation

## 2017-09-28 DIAGNOSIS — Z90710 Acquired absence of both cervix and uterus: Secondary | ICD-10-CM | POA: Insufficient documentation

## 2017-09-28 DIAGNOSIS — R310 Gross hematuria: Secondary | ICD-10-CM | POA: Insufficient documentation

## 2017-09-28 DIAGNOSIS — N200 Calculus of kidney: Secondary | ICD-10-CM

## 2017-09-28 NOTE — Telephone Encounter (Signed)
Message left for patient to return my call.  

## 2017-09-28 NOTE — Telephone Encounter (Signed)
[facility] [place] [facility] called report for CT renal stone. Report is in epic and copy of report taken to Allie Bossier NP area now. Diane did not know if pt was waiting.

## 2017-09-28 NOTE — Telephone Encounter (Signed)
Noted, urgent Urology referral placed, forwarded to referral coordinator earlier today.

## 2017-09-28 NOTE — Telephone Encounter (Signed)
Spoken and notified patient of Kate's comments. Patient verbalized understanding.  Patient stated that she does have pain at times on her right side.

## 2017-09-28 NOTE — Telephone Encounter (Signed)
Please notify patient that she has an obstructing kidney stone with inflammation to the right kidney. She also has several smaller non obstructing kidney stones on the left side. This is on the right side. She needs to see Urology as soon as possible. I've placed a referral, will send to [facility] as [place]. How's she feeling?

## 2017-09-29 ENCOUNTER — Telehealth: Payer: Self-pay

## 2017-09-29 DIAGNOSIS — N202 Calculus of kidney with calculus of ureter: Secondary | ICD-10-CM | POA: Diagnosis not present

## 2017-09-29 NOTE — Telephone Encounter (Signed)
Urgent Urology Appt made for today at 8:15am and patient aware.

## 2017-09-29 NOTE — Telephone Encounter (Signed)
Noted  

## 2017-10-05 ENCOUNTER — Other Ambulatory Visit: Payer: Self-pay | Admitting: Urology

## 2017-10-06 ENCOUNTER — Encounter (HOSPITAL_COMMUNITY): Payer: Self-pay | Admitting: *Deleted

## 2017-10-06 NOTE — Progress Notes (Signed)
Consulted Dr. Oren Bracket  About patient stopping Nardil medication on Sunday 10/04/2017 for surgery on 10/07/2017 and wanted to know if this was OK for surgery. Dr. Ola Spurr stated it was OK for patient to have stopped it on Sunday. Anesthesia will address patient starting the Nardil after her surgery tomorrow before she goes home.

## 2017-10-06 NOTE — Progress Notes (Signed)
LM on VM for Selita informing her that I am unable to get in touch with patient at phone numbers given in [facility] for surgery on 10/07/2017. LM on VM for Selita to call me back.

## 2017-10-07 ENCOUNTER — Ambulatory Visit (HOSPITAL_COMMUNITY): Payer: Medicare Other | Admitting: Anesthesiology

## 2017-10-07 ENCOUNTER — Ambulatory Visit (HOSPITAL_COMMUNITY): Payer: Medicare Other

## 2017-10-07 ENCOUNTER — Encounter (HOSPITAL_COMMUNITY)
Admission: RE | Disposition: A | Discharge status: Home / Self Care | Payer: Self-pay | Source: Ambulatory Visit | Attending: Urology

## 2017-10-07 ENCOUNTER — Ambulatory Visit (HOSPITAL_COMMUNITY)
Admission: RE | Admit: 2017-10-07 | Discharge: 2017-10-07 | Disposition: A | Discharge status: Home / Self Care | Payer: Medicare Other | Source: Ambulatory Visit | Attending: Urology | Admitting: Urology

## 2017-10-07 ENCOUNTER — Encounter (HOSPITAL_COMMUNITY): Payer: Self-pay | Admitting: *Deleted

## 2017-10-07 DIAGNOSIS — N132 Hydronephrosis with renal and ureteral calculous obstruction: Secondary | ICD-10-CM | POA: Insufficient documentation

## 2017-10-07 DIAGNOSIS — E7800 Pure hypercholesterolemia, unspecified: Secondary | ICD-10-CM | POA: Diagnosis not present

## 2017-10-07 DIAGNOSIS — Z853 Personal history of malignant neoplasm of breast: Secondary | ICD-10-CM | POA: Diagnosis not present

## 2017-10-07 DIAGNOSIS — Z803 Family history of malignant neoplasm of breast: Secondary | ICD-10-CM | POA: Insufficient documentation

## 2017-10-07 DIAGNOSIS — Z79899 Other long term (current) drug therapy: Secondary | ICD-10-CM | POA: Diagnosis not present

## 2017-10-07 DIAGNOSIS — Z880 Allergy status to penicillin: Secondary | ICD-10-CM | POA: Insufficient documentation

## 2017-10-07 DIAGNOSIS — Z7982 Long term (current) use of aspirin: Secondary | ICD-10-CM | POA: Insufficient documentation

## 2017-10-07 DIAGNOSIS — E785 Hyperlipidemia, unspecified: Secondary | ICD-10-CM | POA: Diagnosis not present

## 2017-10-07 DIAGNOSIS — R310 Gross hematuria: Secondary | ICD-10-CM | POA: Diagnosis not present

## 2017-10-07 DIAGNOSIS — Z820 Family history of epilepsy and other diseases of the nervous system: Secondary | ICD-10-CM | POA: Diagnosis not present

## 2017-10-07 DIAGNOSIS — Z800 Family history of malignant neoplasm of digestive organs: Secondary | ICD-10-CM | POA: Insufficient documentation

## 2017-10-07 DIAGNOSIS — F329 Major depressive disorder, single episode, unspecified: Secondary | ICD-10-CM | POA: Diagnosis not present

## 2017-10-07 DIAGNOSIS — Z87442 Personal history of urinary calculi: Secondary | ICD-10-CM | POA: Insufficient documentation

## 2017-10-07 DIAGNOSIS — N202 Calculus of kidney with calculus of ureter: Secondary | ICD-10-CM | POA: Diagnosis not present

## 2017-10-07 DIAGNOSIS — N201 Calculus of ureter: Secondary | ICD-10-CM | POA: Diagnosis not present

## 2017-10-07 DIAGNOSIS — Z91041 Radiographic dye allergy status: Secondary | ICD-10-CM | POA: Insufficient documentation

## 2017-10-07 DIAGNOSIS — C50919 Malignant neoplasm of unspecified site of unspecified female breast: Secondary | ICD-10-CM | POA: Diagnosis not present

## 2017-10-07 DIAGNOSIS — Z8042 Family history of malignant neoplasm of prostate: Secondary | ICD-10-CM | POA: Insufficient documentation

## 2017-10-07 DIAGNOSIS — Z8249 Family history of ischemic heart disease and other diseases of the circulatory system: Secondary | ICD-10-CM | POA: Insufficient documentation

## 2017-10-07 DIAGNOSIS — Z888 Allergy status to other drugs, medicaments and biological substances status: Secondary | ICD-10-CM | POA: Insufficient documentation

## 2017-10-07 HISTORY — PX: HOLMIUM LASER APPLICATION: SHX5852

## 2017-10-07 HISTORY — PX: CYSTOSCOPY WITH RETROGRADE PYELOGRAM, URETEROSCOPY AND STENT PLACEMENT: SHX5789

## 2017-10-07 LAB — CBC
HEMATOCRIT: 34.6 % — AB (ref 36.0–46.0)
HEMOGLOBIN: 11 g/dL — AB (ref 12.0–15.0)
MCH: 29 pg (ref 26.0–34.0)
MCHC: 31.8 g/dL (ref 30.0–36.0)
MCV: 91.3 fL (ref 78.0–100.0)
Platelets: 287 10*3/uL (ref 150–400)
RBC: 3.79 MIL/uL — ABNORMAL LOW (ref 3.87–5.11)
RDW: 13.3 % (ref 11.5–15.5)
WBC: 4.4 10*3/uL (ref 4.0–10.5)

## 2017-10-07 LAB — BASIC METABOLIC PANEL
Anion gap: 12 (ref 5–15)
BUN: 16 mg/dL (ref 6–20)
CHLORIDE: 101 mmol/L (ref 101–111)
CO2: 26 mmol/L (ref 22–32)
CREATININE: 0.72 mg/dL (ref 0.44–1.00)
Calcium: 9.6 mg/dL (ref 8.9–10.3)
GFR calc Af Amer: >60 mL/min (ref >60)
GFR calc non Af Amer: >60 mL/min (ref >60)
GLUCOSE: 115 mg/dL — AB (ref 65–99)
Potassium: 3.9 mmol/L (ref 3.5–5.1)
Sodium: 139 mmol/L (ref 135–145)

## 2017-10-07 SURGERY — CYSTOURETEROSCOPY, WITH RETROGRADE PYELOGRAM AND STENT INSERTION
Anesthesia: General | Site: Ureter | Laterality: Right

## 2017-10-07 MED ORDER — PROPOFOL 10 MG/ML IV BOLUS
INTRAVENOUS | Status: DC | PRN
  Administered 2017-10-07: 150 mg via INTRAVENOUS

## 2017-10-07 MED ORDER — IOHEXOL 300 MG/ML  SOLN
INTRAMUSCULAR | Status: DC | PRN
  Administered 2017-10-07: 12 mL via INTRAVENOUS

## 2017-10-07 MED ORDER — FENTANYL CITRATE (PF) 100 MCG/2ML IJ SOLN
INTRAMUSCULAR | Status: DC | PRN
  Administered 2017-10-07 (×4): 25 ug via INTRAVENOUS

## 2017-10-07 MED ORDER — ONDANSETRON HCL 4 MG/2ML IJ SOLN
INTRAMUSCULAR | Status: AC
  Filled 2017-10-07: qty 2

## 2017-10-07 MED ORDER — CIPROFLOXACIN HCL 500 MG PO TABS: 500.0000 mg | ORAL_TABLET | Freq: Once | ORAL | 0 refills | Status: AC

## 2017-10-07 MED ORDER — TRAMADOL HCL 50 MG PO TABS: 50.0000 mg | ORAL_TABLET | Freq: Once | ORAL | Status: DC

## 2017-10-07 MED ORDER — BELLADONNA-OPIUM 16.2-30 MG RE SUPP
RECTAL | Status: AC
  Filled 2017-10-07: qty 1

## 2017-10-07 MED ORDER — LACTATED RINGERS IV SOLN
INTRAVENOUS | Status: DC | PRN
  Administered 2017-10-07: 10:00:00 via INTRAVENOUS

## 2017-10-07 MED ORDER — CIPROFLOXACIN IN D5W 400 MG/200ML IV SOLN
INTRAVENOUS | Status: AC
  Filled 2017-10-07: qty 200

## 2017-10-07 MED ORDER — LABETALOL HCL 5 MG/ML IV SOLN
10.0000 mg | Freq: Once | INTRAVENOUS | Status: AC
  Administered 2017-10-07: 10 mg via INTRAVENOUS

## 2017-10-07 MED ORDER — FENTANYL CITRATE (PF) 100 MCG/2ML IJ SOLN
25.0000 ug | INTRAMUSCULAR | Status: DC | PRN
  Administered 2017-10-07 (×3): 50 ug via INTRAVENOUS

## 2017-10-07 MED ORDER — PHENAZOPYRIDINE HCL 200 MG PO TABS: 200.0000 mg | ORAL_TABLET | Freq: Three times a day (TID) | ORAL | 0 refills | Status: DC | PRN

## 2017-10-07 MED ORDER — 0.9 % SODIUM CHLORIDE (POUR BTL) OPTIME
TOPICAL | Status: DC | PRN
  Administered 2017-10-07: 1000 mL

## 2017-10-07 MED ORDER — BELLADONNA ALKALOIDS-OPIUM 16.2-60 MG RE SUPP
RECTAL | Status: DC | PRN
  Administered 2017-10-07: 1 via RECTAL

## 2017-10-07 MED ORDER — LIDOCAINE HCL 2 % EX GEL
CUTANEOUS | Status: AC
  Filled 2017-10-07: qty 5

## 2017-10-07 MED ORDER — LACTATED RINGERS IV SOLN
INTRAVENOUS | Status: DC
  Administered 2017-10-07: 15:00:00 via INTRAVENOUS

## 2017-10-07 MED ORDER — SODIUM CHLORIDE 0.9 % IR SOLN
Status: DC | PRN
  Administered 2017-10-07: 3000 mL

## 2017-10-07 MED ORDER — LIP MEDEX EX OINT
TOPICAL_OINTMENT | CUTANEOUS | Status: AC
  Filled 2017-10-07: qty 7

## 2017-10-07 MED ORDER — FENTANYL CITRATE (PF) 100 MCG/2ML IJ SOLN
INTRAMUSCULAR | Status: AC
  Filled 2017-10-07: qty 2

## 2017-10-07 MED ORDER — ONDANSETRON HCL 4 MG/2ML IJ SOLN
INTRAMUSCULAR | Status: DC | PRN
  Administered 2017-10-07: 4 mg via INTRAVENOUS

## 2017-10-07 MED ORDER — TRAMADOL HCL 50 MG PO TABS: 50.0000 mg | ORAL_TABLET | Freq: Four times a day (QID) | ORAL | 0 refills | Status: DC | PRN

## 2017-10-07 MED ORDER — CIPROFLOXACIN IN D5W 400 MG/200ML IV SOLN
400.0000 mg | INTRAVENOUS | Status: AC
  Administered 2017-10-07: 400 mg via INTRAVENOUS

## 2017-10-07 MED ORDER — LABETALOL HCL 5 MG/ML IV SOLN
INTRAVENOUS | Status: AC
  Filled 2017-10-07: qty 4

## 2017-10-07 MED ORDER — LIDOCAINE HCL 2 % EX GEL
CUTANEOUS | Status: DC | PRN
  Administered 2017-10-07: 1

## 2017-10-07 MED ORDER — LIDOCAINE 2% (20 MG/ML) 5 ML SYRINGE
INTRAMUSCULAR | Status: AC
  Filled 2017-10-07: qty 5

## 2017-10-07 MED ORDER — FENTANYL CITRATE (PF) 100 MCG/2ML IJ SOLN
INTRAMUSCULAR | Status: DC
Start: 2017-10-07 — End: 2017-10-07
  Filled 2017-10-07: qty 2

## 2017-10-07 MED ORDER — LIDOCAINE HCL (CARDIAC) 20 MG/ML IV SOLN
INTRAVENOUS | Status: DC | PRN
  Administered 2017-10-07: 50 mg via INTRAVENOUS

## 2017-10-07 MED FILL — traMADol HCL 50 MG TABS: 50 | 2 days supply | Qty: 15 | Fill #0

## 2017-10-07 MED FILL — CIPROFLOXACIN HCL 500 MG TA: 500 | 1 days supply | Qty: 1 | Fill #0

## 2017-10-07 SURGICAL SUPPLY — 25 items
BAG URO CATCHER STRL LF (MISCELLANEOUS) ×3 IMPLANT
BASKET DAKOTA 1.9FR 11X120 (BASKET) IMPLANT
BASKET ZERO TIP NITINOL 2.4FR (BASKET) IMPLANT
BSKT STON RTRVL ZERO TP 2.4FR (BASKET)
CATH URET 5FR 28IN OPEN ENDED (CATHETERS) ×3 IMPLANT
CLOTH BEACON ORANGE TIMEOUT ST (SAFETY) ×3 IMPLANT
COVER FOOTSWITCH UNIV (MISCELLANEOUS) IMPLANT
COVER SURGICAL LIGHT HANDLE (MISCELLANEOUS) IMPLANT
EXTRACTOR STONE NITINOL NGAGE (UROLOGICAL SUPPLIES) ×3 IMPLANT
FIBER LASER FLEXIVA 1000 (UROLOGICAL SUPPLIES) IMPLANT
FIBER LASER FLEXIVA 365 (UROLOGICAL SUPPLIES) ×3 IMPLANT
FIBER LASER FLEXIVA 550 (UROLOGICAL SUPPLIES) IMPLANT
FIBER LASER TRAC TIP (UROLOGICAL SUPPLIES) IMPLANT
GLOVE BIOGEL M STRL SZ7.5 (GLOVE) ×3 IMPLANT
GOWN STRL REUS W/TWL XL LVL3 (GOWN DISPOSABLE) ×9 IMPLANT
GUIDEWIRE ANG ZIPWIRE 038X150 (WIRE) IMPLANT
GUIDEWIRE STR DUAL SENSOR (WIRE) ×6 IMPLANT
MANIFOLD NEPTUNE II (INSTRUMENTS) ×3 IMPLANT
PACK CYSTO (CUSTOM PROCEDURE TRAY) ×3 IMPLANT
SHEATH ACCESS URETERAL 24CM (SHEATH) IMPLANT
SHEATH ACCESS URETERAL 38CM (SHEATH) IMPLANT
SHEATH ACCESS URETERAL 54CM (SHEATH) IMPLANT
STENT URET 6FRX24 CONTOUR (STENTS) ×3 IMPLANT
TUBING CONNECTING 10 (TUBING) ×3 IMPLANT
WIRE COONS/BENSON .038X145CM (WIRE) IMPLANT

## 2017-10-07 NOTE — Transfer of Care (Signed)
Immediate Anesthesia Transfer of Care Note  Patient: RIVKY CLENDENNING  Procedure(s) Performed: Procedure(s): RIGHT CYSTOSCOPY WITH BILATERAL RETROGRADE PYELOGRAM, URETEROSCOPY AND STENT PLACEMENT (Bilateral) HOLMIUM LASER APPLICATION (Right)  Patient Location: PACU  Anesthesia Type:General  Level of Consciousness:  sedated, patient cooperative and responds to stimulation  Airway & Oxygen Therapy:Patient Spontanous Breathing and Patient connected to face mask oxgen  Post-op Assessment:  Report given to PACU RN and Post -op Vital signs reviewed and stable  Post vital signs:  Reviewed and stable  Last Vitals:  Vitals:   10/07/17 0847  BP: (!) 161/92  Pulse: 91  Resp: 16  Temp: 36.9 C  SpO2: 93%    Complications: No apparent anesthesia complications

## 2017-10-07 NOTE — Op Note (Signed)
Preoperative diagnosis: right ureteral calculus, gross hematuria  Postoperative diagnosis: same  Procedure: 1. Cystoscopy 2. right ureteroscopy and stone removal 3. Ureteroscopic laser lithotripsy 4. right 68F x 24cm ureteral stent placement  5. Bilateral retrograde pyelography with interpretation  Surgeon: Ardis Hughs, MD  Anesthesia: General  Complications: None  Intraoperative findings:  #1) left retrograde pyelogram demonstrated a normal caliber ureter with no evidence of filling defect or abnormality. #2) right retrograde pyelography demonstrated a filling defect within the right ureter consistent with the patient's known calculus without other abnormalities.  EBL: Minimal  Specimens: 1. right ureteral calculus  Disposition of specimens: [facility] for stone analysis  Indication: 75 year old female patient with a right ureteral stone and associated right symptoms. After reviewing the management options for treatment, the patient elected to proceed with the above surgical procedure(s). We have discussed the potential benefits and risks of the procedure, side effects of the proposed treatment, the likelihood of the patient achieving the goals of the procedure, and any potential problems that might occur during the procedure or recuperation. Informed consent has been obtained.   Description of procedure:  The patient was taken to the operating room and general anesthesia was induced.  The patient was placed in the dorsal lithotomy position, prepped and draped in the usual sterile fashion, and preoperative antibiotics were administered. A preoperative time-out was performed.   Cystourethroscopy was performed.  The patient's urethra was examined and was normal.  There was some clot within the patient's bladder, which I was able to easily clear. The bladder was then systematically examined in its entirety. There was no evidence for any bladder tumors,  stones, or other mucosal pathology.    I then performed a left retrograde pyelogram using a 5 [place] open-ended ureteral catheter and injecting 5 mL Omnipaque contrast into the collecting system starting in the distal ureter. This demonstrated the above findings.  Attention then turned to the right ureteral orifice and a ureteral catheter was used to intubate the ureteral orifice.  Omnipaque contrast was injected through the ureteral catheter and a retrograde pyelogram was performed with findings as dictated above.  A 0.38 sensor guidewire was then advanced up the right ureter into the renal pelvis under fluoroscopic guidance. The 6 Fr semirigid ureteroscope was then advanced into the ureter next to the guidewire and the calculus was identified.   The stone was then fragmented with the 365 micron holmium laser fiber on a setting of  0.6 and frequency of 6 Hz.   All stones were then removed from the ureter with an N-gage nitinol basket.  Reinspection of the ureter revealed no remaining visible stones or fragments.   I then advanced a second sensor wire into the right ureteral orifice and up into the renal pelvis. I then exchanged the ureteroscope for the flexible ureteroscope and advanced it over the wire and into the right renal pelvis removing the wire.  ureteroscopy was then performed and all the clot and debris was removed from her renal pelvis and all the calyces. I was then able to clearly navigate the renal pelvis and all the calyces noting no additional stone fragments.then slowly backed out the flexible ureteroscope under visual guidance noting no significant ureteral trauma or injury.  The wire was then backloaded through the cystoscope and a ureteral stent was advance over the wire using Seldinger technique.  The stent was positioned appropriately under fluoroscopic and cystoscopic guidance.  The wire was then removed with an adequate stent curl  noted in the renal pelvis as well as in the  bladder.  The bladder was then emptied and the procedure ended.  The patient appeared to tolerate the procedure well and without complications.  The patient was able to be awakened and transferred to the recovery unit in satisfactory condition.   Disposition: The tether of the stent was left on and tucked inside the patient's vagina.  Instructions for removing the stent have been provided to the patient. The patient has been scheduled for followup in 6 weeks with a renal ultrasound.

## 2017-10-07 NOTE — Anesthesia Preprocedure Evaluation (Signed)
Anesthesia Evaluation  Patient identified by MRN, date of birth, ID band Patient awake    Reviewed: Allergy & Precautions, NPO status , Patient's Chart, lab work & pertinent test results  Airway Mallampati: II  TM Distance: >3 FB Neck ROM: Full    Dental no notable dental hx.    Pulmonary neg pulmonary ROS,    Pulmonary exam normal breath sounds clear to auscultation       Cardiovascular negative cardio ROS Normal cardiovascular exam Rhythm:Regular Rate:Normal     Neuro/Psych negative neurological ROS  negative psych ROS   GI/Hepatic negative GI ROS, Neg liver ROS,   Endo/Other  negative endocrine ROS  Renal/GU negative Renal ROS  negative genitourinary   Musculoskeletal negative musculoskeletal ROS (+)   Abdominal   Peds negative pediatric ROS (+)  Hematology negative hematology ROS (+)   Anesthesia Other Findings   Reproductive/Obstetrics negative OB ROS                             Anesthesia Physical Anesthesia Plan  ASA: II  Anesthesia Plan: General   Post-op Pain Management:    Induction: Intravenous  PONV Risk Score and Plan: 4 or greater and Ondansetron and Treatment may vary due to age or medical condition  Airway Management Planned: LMA  Additional Equipment:   Intra-op Plan:   Post-operative Plan: Extubation in OR  Informed Consent: I have reviewed the patients History and Physical, chart, labs and discussed the procedure including the risks, benefits and alternatives for the proposed anesthesia with the patient or authorized representative who has indicated his/her understanding and acceptance.   Dental advisory given  Plan Discussed with: CRNA  Anesthesia Plan Comments:         Anesthesia Quick Evaluation

## 2017-10-07 NOTE — Anesthesia Procedure Notes (Signed)
Procedure Name: LMA Insertion Date/Time: 10/07/2017 10:55 AM Performed by: Samiha Denapoli, Virgel Gess Pre-anesthesia Checklist: Patient identified, Emergency Drugs available, Suction available, Patient being monitored and Timeout performed Patient Re-evaluated:Patient Re-evaluated prior to induction Oxygen Delivery Method: Circle system utilized Preoxygenation: Pre-oxygenation with 100% oxygen Induction Type: IV induction Ventilation: Mask ventilation without difficulty LMA: LMA inserted LMA Size: 4.0 Number of attempts: 1 Placement Confirmation: positive ETCO2 and breath sounds checked- equal and bilateral Tube secured with: Tape Dental Injury: Teeth and Oropharynx as per pre-operative assessment

## 2017-10-07 NOTE — Discharge Instructions (Signed)
DISCHARGE INSTRUCTIONS FOR KIDNEY STONE/URETERAL STENT   MEDICATIONS:  1.  Resume all your other meds from home - except do not take any extra narcotic pain meds that you may have at home.  2. Pyridium is to help with the burning/stinging when you urinate. 3. Tramadol is for moderate/severe pain, otherwise taking upto 1000 mg every 6 hours of plainTylenol will help treat your pain.   4. Take Cipro one hour prior to removal of your stent.   ACTIVITY:  1. No strenuous activity x 1week  2. No driving while on narcotic pain medications  3. Drink plenty of water  4. Continue to walk at home - you can still get blood clots when you are at home, so keep active, but don't over do it.  5. May return to work/school tomorrow or when you feel ready   BATHING:  1. You can shower and we recommend daily showers  2. You have a string coming from your urethra: The stent string is attached to your ureteral stent. Do not pull on this.   SIGNS/SYMPTOMS TO CALL:  Please call us if you have a fever greater than 101.5, uncontrolled nausea/vomiting, uncontrolled pain, dizziness, unable to urinate, bloody urine, chest pain, shortness of breath, leg swelling, leg pain, redness around wound, drainage from wound, or any other concerns or questions.   You can reach [place] at [phone].   FOLLOW-UP:  1. You have an appointment in 6 weeks with a ultrasound of your kidneys prior.  2. You have a string attached to your stent, you may remove it on Oct 17th. To do this, pull the strings until the stents are completely removed. You may feel an odd sensation in your back.      General Anesthesia, Adult, Care After These instructions provide you with information about caring for yourself after your procedure. Your health care provider may also give you more specific instructions. Your treatment has been planned according to current medical practices, but problems sometimes occur. Call your health care provider if you have  any problems or questions after your procedure. What can I expect after the procedure? After the procedure, it is common to have:  Vomiting.  A sore throat.  Mental slowness.  It is common to feel:  Nauseous.  Cold or shivery.  Sleepy.  Tired.  Sore or achy, even in parts of your body where you did not have surgery.  Follow these instructions at home: For at least 24 hours after the procedure:  Do not: ? Participate in activities where you could fall or become injured. ? Drive. ? Use heavy machinery. ? Drink alcohol. ? Take sleeping pills or medicines that cause drowsiness. ? Make important decisions or sign legal documents. ? Take care of children on your own.  Rest. Eating and drinking  If you vomit, drink water, juice, or soup when you can drink without vomiting.  Drink enough fluid to keep your urine clear or pale yellow.  Make sure you have little or no nausea before eating solid foods.  Follow the diet recommended by your health care provider. General instructions  Have a responsible adult stay with you until you are awake and alert.  Return to your normal activities as told by your health care provider. Ask your health care provider what activities are safe for you.  Take over-the-counter and prescription medicines only as told by your health care provider.  If you smoke, do not smoke without supervision.  Keep all follow-up visits  as told by your health care provider. This is important. Contact a health care provider if:  You continue to have nausea or vomiting at home, and medicines are not helpful.  You cannot drink fluids or start eating again.  You cannot urinate after 8-12 hours.  You develop a skin rash.  You have fever.  You have increasing redness at the site of your procedure. Get help right away if:  You have difficulty breathing.  You have chest pain.  You have unexpected bleeding.  You feel that you are having a  life-threatening or urgent problem. This information is not intended to replace advice given to you by your health care provider. Make sure you discuss any questions you have with your health care provider. Document Released: 03/23/2001 Document Revised: 05/19/2016 Document Reviewed: 11/29/2015 Elsevier Interactive Patient Education  [employer].

## 2017-10-07 NOTE — Anesthesia Postprocedure Evaluation (Signed)
Anesthesia Post Note  Patient: Adrienne [name]  Procedure(s) Performed: RIGHT CYSTOSCOPY WITH BILATERAL RETROGRADE PYELOGRAM, URETEROSCOPY AND STENT PLACEMENT (Bilateral Ureter) HOLMIUM LASER APPLICATION (Right )     Patient location during evaluation: PACU Anesthesia Type: General Level of consciousness: awake and alert Pain management: pain level controlled Vital Signs Assessment: post-procedure vital signs reviewed and stable Respiratory status: spontaneous breathing, nonlabored ventilation, respiratory function stable and patient connected to nasal cannula oxygen Cardiovascular status: blood pressure returned to baseline and stable Postop Assessment: no apparent nausea or vomiting Anesthetic complications: no    Last Vitals:  Vitals:   10/07/17 1456 10/07/17 1549  BP: (!) 184/85 (!) 182/81  Pulse: 66 64  Resp: 12 16  Temp: 36.8 C 36.6 C  SpO2: 95% 98%    Last Pain:  Vitals:   10/07/17 1549  TempSrc: Oral  PainSc: 3                  Montez Hageman

## 2017-10-07 NOTE — H&P (Signed)
Acute Kidney Stone  HPI: Adrienne [name] is a 76 year-old female patient who was referred by Dr. Jinny Sanders, MD who is here for further eval and management of kidney stones.  She was diagnosed with a kidney stone on approximately 09/28/2017.   Her pain started about approximately 07/29/2017. The pain is on the right side.   Abdomen/Pelvic CT: 11x8x9cm right mid-ureteral stone with hydro, 4 small non-obstructing stone in the left kidney. The patient underwent CT scan prior to today's appointment.   The patient relates initially having flank pain. She is currently having flank pain and back pain. She denies having groin pain, nausea, vomiting, fever, chills, and voiding symptoms. She has not caught a stone in her urine strainer since her symptoms began.   She has never had surgical treatment for calculi in the past. This is not her first kidney stone. She has had 1 stones prior to getting this one.   The patient is relatively asymptomatic with intermittent crampy right-sided pain. Currently, she is comfortable. She is not taking anything to help relieve her symptoms. She denies any nausea. She is not having any voiding symptoms. She has had some gross hematuria.     ALLERGIES: Iodinated Diagnostic Agents IVP Dye Penicillins Reglan    MEDICATIONS: Calcium TABS Oral  Fish Oil CAPS Oral  Gabapentin 800 mg tablet Oral  Lo-Dose Aspirin Ec 81 mg tablet, delayed release  Loperamide HCl TABS Oral  Lorazepam 0.5 mg tablet Oral  Multivitamins tablet Oral  Nardil 15 mg tablet Oral  Tylenol  Vitamin D3 2,000 unit tablet Oral     GU PSH: Hysterectomy Unilat SO - 2015      PSH Notes: Hysterectomy, Tonsillectomy, dilation and curettage of uterus x2, Rectocele repair   NON-GU PSH: Breast lumpectomy, Left, 2009 Remove Tonsils - 2015    GU PMH: History of urolithiasis, History of renal calculi - 2015 Renal calculus, Nephrolithiasis - 2015    NON-GU PMH: Breast Cancer, History, History of  breast cancer - 2015 Encounter for general adult medical examination without abnormal findings, Encounter for preventive health examination - 2015 Personal history of other infectious and parasitic diseases, History of herpes genitalis - 2015 Personal history of other mental and behavioral disorders, History of depression - 2015 Anemia, unspecified Depression Hypercholesterolemia Spinal stenosis, site unspecified    FAMILY HISTORY: 1 Daughter - Daughter Alzheimer's Disease - Father Breast Cancer - Mother Heart Attack - Father hyperlipidemia - Brother Hypertension - Brother malignant neoplasm of prostate - Father pancreatic cancer - Father   SOCIAL HISTORY: Marital Status: Divorced Preferred Language: English; Ethnicity: Not Hispanic Or Latino; Race: White Current Smoking Status: Patient has never smoked.   Tobacco Use Assessment Completed: Used Tobacco in last 30 days? Does drink.      Notes: Never smoker, Daily caffeine consumption, 2-3 servings a day, Father deceased, Daily alcohol use, Mother deceased, One child, Retired, Divorced   REVIEW OF SYSTEMS:    GU Review Female:   Patient denies frequent urination, hard to postpone urination, burning /pain with urination, get up at night to urinate, leakage of urine, stream starts and stops, trouble starting your stream, have to strain to urinate, and being pregnant.  Gastrointestinal (Upper):   Patient denies nausea, vomiting, and indigestion/ heartburn.  Gastrointestinal (Lower):   Patient denies diarrhea and constipation.  Constitutional:   Patient denies fever, night sweats, weight loss, and fatigue.  Skin:   Patient denies skin rash/ lesion and itching.  Eyes:   Patient denies  blurred vision and double vision.  Ears/ Nose/ Throat:   Patient denies sore throat and sinus problems.  Hematologic/Lymphatic:   Patient denies swollen glands and easy bruising.  Cardiovascular:   Patient denies leg swelling and chest pains.   Respiratory:   Patient denies cough and shortness of breath.  Endocrine:   Patient denies excessive thirst.  Musculoskeletal:   Patient denies back pain and joint pain.  Neurological:   Patient denies headaches and dizziness.  Psychologic:   Patient denies anxiety and depression.   VITAL SIGNS:      09/29/2017 09:18 AM  Weight 138 lb / 62.6 kg  Height 60 in / 152.4 cm  BP 136/82 mmHg  Pulse 87 /min  Temperature 98.5 F / 36.9 C  BMI 26.9 kg/m   MULTI-SYSTEM PHYSICAL EXAMINATION:    Constitutional: Well-nourished. No physical deformities. Normally developed. Good grooming.  Neck: Neck symmetrical, not swollen. Normal tracheal position.  Respiratory: No labored breathing, no use of accessory muscles. Clear to auscultation bilaterally  Cardiovascular: Normal temperature, normal extremity pulses, no swelling, no varicosities. Regular rate and rhythm  Lymphatic: No enlargement of neck, axillae, groin.  Skin: No paleness, no jaundice, no cyanosis. No lesion, no ulcer, no rash.  Neurologic / Psychiatric: Oriented to time, oriented to place, oriented to person. No depression, no anxiety, no agitation.  Gastrointestinal: No mass, no tenderness, no rigidity, non obese abdomen.  Eyes: Normal conjunctivae. Normal eyelids.  Ears, Nose, Mouth, and Throat: Left ear no scars, no lesions, no masses. Right ear no scars, no lesions, no masses. Nose no scars, no lesions, no masses. Normal hearing. Normal lips.  Musculoskeletal: Normal gait and station of head and neck.     PAST DATA REVIEWED:  Source Of History:  Patient  Records Review:   Previous Patient Records  X-Ray Review: C.T. Abdomen/Pelvis: Reviewed Films. 1 cm stone in the right mid ureter with proximal hydronephrosis    PROCEDURES:          Urinalysis w/Scope Dipstick Dipstick Cont'd Micro  Color: Amber Bilirubin: Neg WBC/hpf: 0 - 5/hpf  Appearance: Turbid Ketones: Neg RBC/hpf: 40 - 60/hpf  Specific Gravity: 1.020 Blood: 3+  Bacteria: Rare (0-9/hpf)  pH: 7.5 Protein: 1+ Cystals: NS (Not Seen)  Glucose: Neg Urobilinogen: 0.2 Casts: NS (Not Seen)    Nitrites: Neg Trichomonas: Not Present    Leukocyte Esterase: Trace Mucous: Not Present      Epithelial Cells: NS (Not Seen)      Yeast: NS (Not Seen)      Sperm: Not Present    Notes: unspun microscopic, too turbid    ASSESSMENT:      ICD-10 Details  1 GU:   Renal and ureteral calculus - N20.2    PLAN:           Document Letter(s):  Created for Patient: Clinical Summary    I went over the treatment options for their stone. We discussed ongoing medical expulsion therapy, ESWL and ureteroscopy. Ultimately the patient and I agreed that ureterscopy is the best option. I went over this surgery with the patient in detail. The patient understands after being put to sleep, we would proceed with a telescope to access the stone and potentially use a laser to fragment the stone before removing it with a basket. After removing the stone the patient will require temporary stent placement in the ureter. This is an outpatient procedure. I also discussed the potential of not being able to gain access safely into the  ureter/kidney. This would require that a stent be placed and then the patient rescheduled several weeks later for a second attempt. They also understand the small risks of ureteral trauma causing a stricture or permanent damage. I also explained the risk of urinary tract infection. Having gone over the procedure itself, the expected outcome, and the risks/benefits the patient has agreed to proceed.        Notes:   Having reviewed the options, we have settled on ureteroscopy. We will plan to place a stent postoperatively.   I would also perform bilateral retrograde pyelograms given her gross hematuria. I suspect this is from the stone, but would like to rule out any other abnormalities.

## 2017-10-08 ENCOUNTER — Encounter (HOSPITAL_COMMUNITY): Payer: Self-pay | Admitting: Urology

## 2017-10-14 DIAGNOSIS — N202 Calculus of kidney with calculus of ureter: Secondary | ICD-10-CM | POA: Diagnosis not present

## 2017-11-03 DIAGNOSIS — N202 Calculus of kidney with calculus of ureter: Secondary | ICD-10-CM | POA: Diagnosis not present

## 2017-11-03 DIAGNOSIS — Z23 Encounter for immunization: Secondary | ICD-10-CM | POA: Diagnosis not present

## 2017-11-18 DIAGNOSIS — N201 Calculus of ureter: Secondary | ICD-10-CM | POA: Diagnosis not present

## 2017-11-18 DIAGNOSIS — N130 Hydronephrosis with ureteropelvic junction obstruction: Secondary | ICD-10-CM | POA: Diagnosis not present

## 2017-11-24 ENCOUNTER — Encounter: Payer: Self-pay | Admitting: Genetics

## 2017-11-30 ENCOUNTER — Encounter: Payer: PRIVATE HEALTH INSURANCE | Admitting: Internal Medicine

## 2017-11-30 ENCOUNTER — Encounter: Payer: Self-pay | Admitting: Internal Medicine

## 2017-11-30 ENCOUNTER — Ambulatory Visit (INDEPENDENT_AMBULATORY_CARE_PROVIDER_SITE_OTHER): Payer: Medicare Other | Admitting: Internal Medicine

## 2017-11-30 ENCOUNTER — Encounter: Payer: Self-pay | Admitting: Genetics

## 2017-11-30 VITALS — BP 136/80 | HR 75 | Temp 98.1°F | Ht 60.0 in | Wt 134.0 lb

## 2017-11-30 DIAGNOSIS — G629 Polyneuropathy, unspecified: Secondary | ICD-10-CM

## 2017-11-30 DIAGNOSIS — F339 Major depressive disorder, recurrent, unspecified: Secondary | ICD-10-CM

## 2017-11-30 DIAGNOSIS — R251 Tremor, unspecified: Secondary | ICD-10-CM

## 2017-11-30 DIAGNOSIS — Z23 Encounter for immunization: Secondary | ICD-10-CM | POA: Diagnosis not present

## 2017-11-30 DIAGNOSIS — N200 Calculus of kidney: Secondary | ICD-10-CM | POA: Diagnosis not present

## 2017-11-30 DIAGNOSIS — Z0001 Encounter for general adult medical examination with abnormal findings: Secondary | ICD-10-CM

## 2017-11-30 DIAGNOSIS — Z7189 Other specified counseling: Secondary | ICD-10-CM

## 2017-11-30 DIAGNOSIS — Z0000 Encounter for general adult medical examination without abnormal findings: Secondary | ICD-10-CM

## 2017-11-30 MED ORDER — TRIAMCINOLONE ACETONIDE 0.1 % EX CREA: 1.0000 "application " | TOPICAL_CREAM | Freq: Two times a day (BID) | CUTANEOUS | 1 refills | Status: DC | PRN

## 2017-11-30 NOTE — Assessment & Plan Note (Signed)
Removed with cystoscopy Some diarrhea since--not new (discussed probiotic)

## 2017-11-30 NOTE — Progress Notes (Signed)
Subjective:    Patient ID: Adrienne [name], female    DOB: 1941/02/24, 76 y.o.   MRN: [id number]  HPI Here for Medicare wellness visit and follow up of chronic health conditions Reviewed form and advanced directives Reviewed other doctors No tobacco Enjoys 1-2 alcohol drinks per day Walks regularly Vision and hearing are good Had 1 fall--no injury Chronic depression is controlled Independent with instrumental ADLs No sig memory issues  Has rash in ears--thinks it is psoriasis This was in her family No scalp problems OTC hydrocortisone cream helps some  Discussed kidney stone Had cystoscopy in October--it was removed and then had stent Will have follow up with Dr Louis Meckel  Diarrhea on and off Thinks it may be nerves---- "I overschedule myself" Uses 1 imodium daily No constipation/no blood Worse after the cystoscopy  No recent pain in hand This was after [facility] Ongoing slight tremor Discussed weaning off since she doesn't know if she needs it now  Mood has been good Very busy but no anhedonia Continues with psychiatrist  Mild pain in hands Plans to restart piano--thinks it will help her dexterity  Current Outpatient Medications on File Prior to Visit  Medication Sig Dispense Refill  . acetaminophen (TYLENOL) 500 MG tablet Take 1,000 mg by mouth every 6 (six) hours as needed for moderate pain.    Marland Kitchen aspirin 81 MG EC tablet Take 81 mg by mouth daily.      . Calcium Carbonate (CALCIUM 600 PO) Take 600 mg by mouth 2 (two) times daily.      . Cholecalciferol (VITAMIN D3) 1000 units CAPS Take 1,000 Units by mouth daily.    . fish oil-omega-3 fatty acids 1000 MG capsule Take 1 g by mouth daily.    Marland Kitchen gabapentin (NEURONTIN) 300 MG capsule Take 2 capsules (600 mg total) by mouth 2 (two) times daily. 360 capsule 3  . Loperamide HCl (IMODIUM A-D PO) Take 2 mg by mouth daily.     Marland Kitchen LORazepam (ATIVAN) 0.5 MG tablet Take 0.5 mg by mouth at bedtime.     . Multiple Vitamin  (MULTIVITAMIN) tablet Take 1 tablet by mouth daily.    . Multiple Vitamins-Minerals (OCUVITE PO) Take 1 tablet by mouth 2 (two) times daily.     . phenelzine (NARDIL) 15 MG tablet Take 30 mg by mouth 2 (two) times daily.     . [DISCONTINUED] Omega-3-acid Ethyl Esters (LOVAZA PO) Take 2,000 mg by mouth 2 (two) times daily in the am and at bedtime..       No current facility-administered medications on file prior to visit.     Allergies  Allergen Reactions  . Demerol [Meperidine]     Avoid Demerol due to patient taking MAOI drug.  Clementeen Hoof [Iodinated Diagnostic Agents] Other (See Comments)    CARDIAC ARREST  . Penicillins Anaphylaxis    Has patient had a PCN reaction causing immediate rash, facial/tongue/throat swelling, SOB or lightheadedness with hypotension: Yes Has patient had a PCN reaction causing severe rash involving mucus membranes or skin necrosis: Yes Has patient had a PCN reaction that required hospitalization: No Has patient had a PCN reaction occurring within the last 10 years: Yes If all of the above answers are "NO", then may proceed with Cephalosporin use.   . Metoclopramide Hcl Anxiety and Rash    Past Medical History:  Diagnosis Date  . Anemia, unspecified   . Bipolar affective disorder ([place])   . Breast cancer ([place])    left  .  Bronchitis   . Depression   . Depressive disorder, not elsewhere classified   . Disorder of bone and cartilage, unspecified   . External hemorrhoids   . Genital herpes, unspecified   . IBS (irritable bowel syndrome)   . Lumbago   . Macular degeneration   . Personal history of radiation therapy 2009  . Spinal stenosis, unspecified region other than cervical     Past Surgical History:  Procedure Laterality Date  . ABDOMINAL HYSTERECTOMY     Total but cervix left  . bilateral tuboplasty    . BREAST LUMPECTOMY Left 6/09   Left; high grade carcinoma in situ  . CYSTOSCOPY WITH RETROGRADE PYELOGRAM, URETEROSCOPY AND STENT PLACEMENT  Bilateral 10/07/2017   Procedure: RIGHT CYSTOSCOPY WITH BILATERAL RETROGRADE PYELOGRAM, URETEROSCOPY AND STENT PLACEMENT;  Surgeon: Ardis Hughs, MD;  Location: [facility];  Service: Urology;  Laterality: Bilateral;  . DILATION AND CURETTAGE OF UTERUS     x 2  . HOLMIUM LASER APPLICATION Right 10/22/8526   Procedure: HOLMIUM LASER APPLICATION;  Surgeon: Ardis Hughs, MD;  Location: [facility];  Service: Urology;  Laterality: Right;  . MRI lumbar  05/2007   L4-L5 spinal stenosis  . RECTOCELE REPAIR    . TONSILLECTOMY      Family History  Problem Relation Age of Onset  . Pancreatic cancer Father   . Prostate cancer Father   . Alzheimer's disease Father   . Heart attack Father   . Ulcerative colitis Mother   . Breast cancer Mother   . Hypertension Brother   . Hyperlipidemia Brother   . Ulcerative colitis Daughter   . Colon cancer Neg Hx     Social History   Socioeconomic History  . Marital status: Divorced    Spouse name: Not on file  . Number of children: 1  . Years of education: Not on file  . Highest education level: Not on file  Social Needs  . Financial resource strain: Not on file  . Food insecurity - worry: Not on file  . Food insecurity - inability: Not on file  . Transportation needs - medical: Not on file  . Transportation needs - non-medical: Not on file  Occupational History  . Occupation: Med Designer, multimedia    Comment: Retired  Tobacco Use  . Smoking status: Never Smoker  . Smokeless tobacco: Never Used  Substance and Sexual Activity  . Alcohol use: Yes    Alcohol/week: 0.0 oz    Types: 10 - 14 Glasses of wine per week    Comment: 1 cocktail (manhattan) and 1 glass of wine daily  . Drug use: No  . Sexual activity: Not on file  Other Topics Concern  . Not on file  Social History Narrative   Has living will   Daughter is health care POA-- no alternate (but probably brother)   Would accept resuscitation attempts   Probably wouldn't want tube feedings if  cognitively aware   Body donation to [facility] already arranged      Review of Systems Appetite is good Appetite down a bit since last year--hard to cook for one Sleeps well--does have nocturia x 1 now Walking dog daily---loves him (daschund) Wears seat belt Teeth fine-- due for dentist (needs to find one--had been going to [facility]) No suspicious skin lesions     Objective:   Physical Exam  Constitutional: She is oriented to person, place, and time. She appears well-developed. No distress.  HENT:  Mouth/Throat: Oropharynx is clear and  moist. No oropharyngeal exudate.  Neck: No thyromegaly present.  Cardiovascular: Normal rate, regular rhythm, normal heart sounds and intact distal pulses. Exam reveals no gallop.  No murmur heard. Pulmonary/Chest: Effort normal and breath sounds normal. No respiratory distress. She has no wheezes. She has no rales.  Abdominal: Soft. She exhibits no distension. There is no tenderness. There is no rebound and no guarding.  Musculoskeletal: She exhibits no edema or tenderness.  Lymphadenopathy:    She has no cervical adenopathy.  Neurological: She is alert and oriented to person, place, and time.  President-- "Donetta Potts--- Bush" [id number] Recall 3/3  Stable mild tremor--head, hands  Skin:  Scaling in left > right ear Scalp without sig lesions  Psychiatric: She has a normal mood and affect. Her behavior is normal.          Assessment & Plan:

## 2017-11-30 NOTE — Assessment & Plan Note (Signed)
Mild  No Rx for now 

## 2017-11-30 NOTE — Addendum Note (Signed)
Addended by: Pilar Grammes on: 11/30/2017 12:53 PM   Modules accepted: Orders

## 2017-11-30 NOTE — Assessment & Plan Note (Signed)
See social history 

## 2017-11-30 NOTE — Assessment & Plan Note (Signed)
In remission Sees psychiatrist still

## 2017-11-30 NOTE — Assessment & Plan Note (Signed)
No pain now Discussed weaning off the gabapentin

## 2017-11-30 NOTE — Patient Instructions (Signed)
You can try to wean off the gabapentin. Decrease to 1 in AM and 2 in PM for 2 weeks, then 1 twice a day for 2 weeks, etc.

## 2017-11-30 NOTE — Assessment & Plan Note (Signed)
I have personally reviewed the Medicare Annual Wellness questionnaire and have noted 1. The patient's medical and social history 2. Their use of alcohol, tobacco or illicit drugs 3. Their current medications and supplements 4. The patient's functional ability including ADL's, fall risks, home safety risks and hearing or visual             impairment. 5. Diet and physical activities 6. Evidence for depression or mood disorders  The patients weight, height, BMI and visual acuity have been recorded in the chart I have made referrals, counseling and provided education to the patient based review of the above and I have provided the pt with a written personalized care plan for preventive services.  I have provided you with a copy of your personalized plan for preventive services. Please take the time to review along with your updated medication list.  Pneumovax booster Yearly mammogram due to past breast cancer Discussed colon screening in 2023 Had flu vaccine Stay sfit

## 2017-11-30 NOTE — Progress Notes (Signed)
Hearing Screening   Method: Audiometry   125Hz  250Hz  500Hz  1000Hz  2000Hz  3000Hz  4000Hz  6000Hz  8000Hz   Right ear:   20 20 20  20     Left ear:   20 20 20  20       Visual Acuity Screening   Right eye Left eye Both eyes  Without correction:     With correction: 20/25 20/20 20/20

## 2018-01-19 DIAGNOSIS — F330 Major depressive disorder, recurrent, mild: Secondary | ICD-10-CM | POA: Diagnosis not present

## 2018-01-25 ENCOUNTER — Telehealth: Payer: Self-pay | Admitting: Internal Medicine

## 2018-01-25 NOTE — Telephone Encounter (Signed)
Pt dropped off health form for [place] to fill out and sign. Please call pt when ready [phone]. Placed forms in RX tower.

## 2018-01-26 NOTE — Telephone Encounter (Signed)
Patient notified form is ready for pick up. °

## 2018-03-09 DIAGNOSIS — Z853 Personal history of malignant neoplasm of breast: Secondary | ICD-10-CM | POA: Diagnosis not present

## 2018-03-09 DIAGNOSIS — Z1501 Genetic susceptibility to malignant neoplasm of breast: Secondary | ICD-10-CM | POA: Diagnosis not present

## 2018-03-09 DIAGNOSIS — C50919 Malignant neoplasm of unspecified site of unspecified female breast: Secondary | ICD-10-CM | POA: Diagnosis not present

## 2018-06-14 ENCOUNTER — Other Ambulatory Visit: Payer: Medicare Other

## 2018-06-14 ENCOUNTER — Ambulatory Visit: Payer: Medicare Other | Admitting: Hematology and Oncology

## 2018-06-14 ENCOUNTER — Other Ambulatory Visit: Payer: Self-pay | Admitting: *Deleted

## 2018-06-14 DIAGNOSIS — Z171 Estrogen receptor negative status [ER-]: Principal | ICD-10-CM

## 2018-06-14 DIAGNOSIS — C50912 Malignant neoplasm of unspecified site of left female breast: Secondary | ICD-10-CM

## 2018-06-15 ENCOUNTER — Inpatient Hospital Stay (HOSPITAL_BASED_OUTPATIENT_CLINIC_OR_DEPARTMENT_OTHER): Payer: PPO | Admitting: Hematology and Oncology

## 2018-06-15 ENCOUNTER — Inpatient Hospital Stay: Payer: PPO | Attending: Hematology and Oncology

## 2018-06-15 ENCOUNTER — Encounter: Payer: Self-pay | Admitting: Hematology and Oncology

## 2018-06-15 VITALS — BP 144/83 | HR 87 | Temp 97.3°F | Resp 18 | Wt 133.5 lb

## 2018-06-15 DIAGNOSIS — C50912 Malignant neoplasm of unspecified site of left female breast: Secondary | ICD-10-CM

## 2018-06-15 DIAGNOSIS — Z171 Estrogen receptor negative status [ER-]: Principal | ICD-10-CM

## 2018-06-15 DIAGNOSIS — D649 Anemia, unspecified: Secondary | ICD-10-CM | POA: Diagnosis not present

## 2018-06-15 DIAGNOSIS — Z86000 Personal history of in-situ neoplasm of breast: Secondary | ICD-10-CM | POA: Insufficient documentation

## 2018-06-15 DIAGNOSIS — D638 Anemia in other chronic diseases classified elsewhere: Secondary | ICD-10-CM

## 2018-06-15 DIAGNOSIS — Z923 Personal history of irradiation: Secondary | ICD-10-CM | POA: Insufficient documentation

## 2018-06-15 LAB — CBC WITH DIFFERENTIAL/PLATELET
Basophils Absolute: 0.1 10*3/uL (ref 0–0.1)
Basophils Relative: 1 %
Eosinophils Absolute: 0.3 10*3/uL (ref 0–0.7)
Eosinophils Relative: 5 %
HCT: 33.9 % — ABNORMAL LOW (ref 35.0–47.0)
Hemoglobin: 11.6 g/dL — ABNORMAL LOW (ref 12.0–16.0)
Lymphocytes Relative: 21 %
Lymphs Abs: 1.4 10*3/uL (ref 1.0–3.6)
MCH: 30.2 pg (ref 26.0–34.0)
MCHC: 34.3 g/dL (ref 32.0–36.0)
MCV: 88.1 fL (ref 80.0–100.0)
Monocytes Absolute: 0.5 10*3/uL (ref 0.2–0.9)
Monocytes Relative: 7 %
Neutro Abs: 4.5 10*3/uL (ref 1.4–6.5)
Neutrophils Relative %: 66 %
Platelets: 312 10*3/uL (ref 150–440)
RBC: 3.85 MIL/uL (ref 3.80–5.20)
RDW: 13.4 % (ref 11.5–14.5)
WBC: 6.8 10*3/uL (ref 3.6–11.0)

## 2018-06-15 LAB — COMPREHENSIVE METABOLIC PANEL
ALT: 18 U/L (ref 14–54)
AST: 32 U/L (ref 15–41)
Albumin: 4.2 g/dL (ref 3.5–5.0)
Alkaline Phosphatase: 64 U/L (ref 38–126)
Anion gap: 7 (ref 5–15)
BUN: 18 mg/dL (ref 6–20)
CO2: 28 mmol/L (ref 22–32)
Calcium: 9.4 mg/dL (ref 8.9–10.3)
Chloride: 106 mmol/L (ref 101–111)
Creatinine, Ser: 0.64 mg/dL (ref 0.44–1.00)
GFR calc Af Amer: >60 mL/min (ref >60)
GFR calc non Af Amer: >60 mL/min (ref >60)
Glucose, Bld: 108 mg/dL — ABNORMAL HIGH (ref 65–99)
Potassium: 4.4 mmol/L (ref 3.5–5.1)
Sodium: 141 mmol/L (ref 135–145)
Total Bilirubin: 0.5 mg/dL (ref 0.3–1.2)
Total Protein: 7.2 g/dL (ref 6.5–8.1)

## 2018-06-15 LAB — FERRITIN: Ferritin: 81 ng/mL (ref 11–307)

## 2018-06-15 NOTE — Progress Notes (Signed)
Pt in for follow up of breast cancer.  Denies any difficulties or concerns today.

## 2018-06-15 NOTE — Progress Notes (Signed)
[facility] day:  06/15/18  Chief Complaint: Adrienne [name] is a 77 y.o. female with a history of DCIS and a normocytic anemia who is seen for 1 year assessment.  HPI: The patient was last seen in the medical oncology clinic on 05/12/2017.  At that time, she felt good.  Exam was unremarkable.  Ferritin was 77.  Bilateral screeing mammogram on 07/31/2017 revealed no evidence of malignancy.  During the interim,  Patient is doing well overall. She has no acute symptoms. Patient is s/p ureteroscopy for urolithiasis removal. Patient denies that she has experienced any B symptoms. She denies any interval infections.  Patient does not verbalize any concerns with regards to her breasts today. Patient  does perform monthly self breast examinations as recommended.   Patient maintains an adequate appetite, and notes that she is eating well. Weight, compared to her last visit to the clinic, has decreased by 7 pounds.   Patient denies pain in the clinic today.   Past Medical History:  Diagnosis Date  . Anemia, unspecified   . Bipolar affective disorder ([place])   . Breast cancer ([place])    left  . Bronchitis   . Depression   . Depressive disorder, not elsewhere classified   . Disorder of bone and cartilage, unspecified   . External hemorrhoids   . Genital herpes, unspecified   . IBS (irritable bowel syndrome)   . Lumbago   . Macular degeneration   . Personal history of radiation therapy 2009  . Spinal stenosis, unspecified region other than cervical     Past Surgical History:  Procedure Laterality Date  . ABDOMINAL HYSTERECTOMY     Total but cervix left  . bilateral tuboplasty    . BREAST LUMPECTOMY Left 6/09   Left; high grade carcinoma in situ  . CYSTOSCOPY WITH RETROGRADE PYELOGRAM, URETEROSCOPY AND STENT PLACEMENT Bilateral 10/07/2017   Procedure: RIGHT CYSTOSCOPY WITH BILATERAL RETROGRADE PYELOGRAM, URETEROSCOPY AND STENT PLACEMENT;   Surgeon: Ardis Hughs, MD;  Location: [facility];  Service: Urology;  Laterality: Bilateral;  . DILATION AND CURETTAGE OF UTERUS     x 2  . HOLMIUM LASER APPLICATION Right 46/50/3546   Procedure: HOLMIUM LASER APPLICATION;  Surgeon: Ardis Hughs, MD;  Location: [facility];  Service: Urology;  Laterality: Right;  . MRI lumbar  05/2007   L4-L5 spinal stenosis  . RECTOCELE REPAIR    . TONSILLECTOMY      Family History  Problem Relation Age of Onset  . Pancreatic cancer Father   . Prostate cancer Father   . Alzheimer's disease Father   . Heart attack Father   . Ulcerative colitis Mother   . Breast cancer Mother   . Hypertension Brother   . Hyperlipidemia Brother   . Ulcerative colitis Daughter   . Colon cancer Neg Hx     Social History:  reports that she has never smoked. She has never used smokeless tobacco. She reports that she drinks about 6.0 - 8.4 oz of alcohol per week. She reports that she does not use drugs.  The patient lives in [place] in [place].. She states that she is into "everything". She is a Hydrographic surveyor. She has a life at [place].  She may have a drink before dinner. The patient is alone today.  Allergies:  Allergies  Allergen Reactions  . Demerol [Meperidine]     Avoid Demerol due to patient taking MAOI drug.  Samuel Germany Dye [Iodinated  Diagnostic Agents] Other (See Comments)    CARDIAC ARREST  . Penicillins Anaphylaxis    Has patient had a PCN reaction causing immediate rash, facial/tongue/throat swelling, SOB or lightheadedness with hypotension: Yes Has patient had a PCN reaction causing severe rash involving mucus membranes or skin necrosis: Yes Has patient had a PCN reaction that required hospitalization: No Has patient had a PCN reaction occurring within the last 10 years: Yes If all of the above answers are "NO", then may proceed with Cephalosporin use.   . Metoclopramide Hcl Anxiety and Rash    Current Medications: Current Outpatient  Medications  Medication Sig Dispense Refill  . acetaminophen (TYLENOL) 500 MG tablet Take 1,000 mg by mouth every 6 (six) hours as needed for moderate pain.    Marland Kitchen aspirin 81 MG EC tablet Take 81 mg by mouth daily.      . Calcium Carbonate (CALCIUM 600 PO) Take 600 mg by mouth 2 (two) times daily.      . Cholecalciferol (VITAMIN D3) 1000 units CAPS Take 1,000 Units by mouth daily.    . fish oil-omega-3 fatty acids 1000 MG capsule Take 1 g by mouth daily.    . Loperamide HCl (IMODIUM A-D PO) Take 2 mg by mouth daily.     Marland Kitchen LORazepam (ATIVAN) 0.5 MG tablet Take 0.5 mg by mouth at bedtime.     . Multiple Vitamin (MULTIVITAMIN) tablet Take 1 tablet by mouth daily.    . Multiple Vitamins-Minerals (OCUVITE PO) Take 1 tablet by mouth 2 (two) times daily.     . phenelzine (NARDIL) 15 MG tablet Take 30 mg by mouth 2 (two) times daily.     Marland Kitchen triamcinolone cream (KENALOG) 0.1 % Apply 1 application topically 2 (two) times daily as needed. 45 g 1   No current facility-administered medications for this visit.     Review of Systems:  GENERAL:  Feels good.  No fevers, sweats.  Weight down 7 pounds in 1 year. PERFORMANCE STATUS (ECOG):  0 HEENT:  No visual changes, runny nose, sore throat, mouth sores or tenderness. Lungs: No shortness of breath or cough.  No hemoptysis. Cardiac:  No chest pain, palpitations, orthopnea, or PND. GI:  No nausea, vomiting, diarrhea, constipation, melena or hematochezia. GU:  Interval right sided kidney stone s/p ureteroscopy.  No urgency, frequency, dysuria, or hematuria. Musculoskeletal:  No back pain.  No joint pain.  No muscle tenderness. Extremities:  No pain or swelling. Skin:  No rashes or skin changes. Neuro:  No headache, numbness or weakness, balance or coordination issues. Endocrine:  No diabetes, thyroid issues, hot flashes or night sweats. Psych:  No mood changes, depression or anxiety. Pain:  No focal pain. Review of systems:  All other systems reviewed and  found to be negative.   Physical Exam: Blood pressure (!) 144/83, pulse 87, temperature (!) 97.3 F (36.3 C), temperature source Tympanic, resp. rate 18, weight 133 lb 8 oz (60.6 kg). GENERAL:  Well developed, well nourished, woman sitting comfortably in the exam room in no acute distress. MENTAL STATUS:  Alert and oriented to person, place and time. HEAD:  Short spiked gray hair.  Normocephalic, atraumatic, face symmetric, no Cushingoid features. EYES:  Glasses.  Brown eyes.  Pupils equal round and reactive to light and accomodation.  No conjunctivitis or scleral icterus. ENT:  Oropharynx clear without lesion.  Tongue normal. Mucous membranes moist.  RESPIRATORY:  Clear to auscultation without rales, wheezes or rhonchi. CARDIOVASCULAR:  Regular rate and rhythm without  murmur, rub or gallop. BREAST:  Right breast without masses, skin changes or nipple discharge.  Superior fibrocystic changes.  Left breast with scar at 5 o'clock.  Inverted nipple (old).  No discrete masses, skin changes or nipple discharge. ABDOMEN:  Soft, non-tender, with active bowel sounds, and no hepatosplenomegaly.  No masses. SKIN:  No rashes, ulcers or lesions. EXTREMITIES: No edema, no skin discoloration or tenderness.  No palpable cords. LYMPH NODES: No palpable cervical, supraclavicular, axillary or inguinal adenopathy  NEUROLOGICAL: Slight tremor.  Unremarkable. PSYCH:  Appropriate.    Appointment on 06/15/2018  Component Date Value Ref Range Status  . Sodium 06/15/2018 141  135 - 145 mmol/L Final  . Potassium 06/15/2018 4.4  3.5 - 5.1 mmol/L Final  . Chloride 06/15/2018 106  101 - 111 mmol/L Final  . CO2 06/15/2018 28  22 - 32 mmol/L Final  . Glucose, Bld 06/15/2018 108* 65 - 99 mg/dL Final  . BUN 06/15/2018 18  6 - 20 mg/dL Final  . Creatinine, Ser 06/15/2018 0.64  0.44 - 1.00 mg/dL Final  . Calcium 06/15/2018 9.4  8.9 - 10.3 mg/dL Final  . Total Protein 06/15/2018 7.2  6.5 - 8.1 g/dL Final  . Albumin  06/15/2018 4.2  3.5 - 5.0 g/dL Final  . AST 06/15/2018 32  15 - 41 U/L Final  . ALT 06/15/2018 18  14 - 54 U/L Final  . Alkaline Phosphatase 06/15/2018 64  38 - 126 U/L Final  . Total Bilirubin 06/15/2018 0.5  0.3 - 1.2 mg/dL Final  . GFR calc non Af Amer 06/15/2018 >60  >60 mL/min Final  . GFR calc Af Amer 06/15/2018 >60  >60 mL/min Final   Comment: (NOTE) The eGFR has been calculated using the CKD EPI equation. This calculation has not been validated in all clinical situations. eGFR's persistently <60 mL/min signify possible Chronic Kidney Disease.   Georgiann Hahn gap 06/15/2018 7  5 - 15 Final   Performed at [facility], [address] [place], [place] [address]  . WBC 06/15/2018 6.8  3.6 - 11.0 K/uL Final  . RBC 06/15/2018 3.85  3.80 - 5.20 MIL/uL Final  . Hemoglobin 06/15/2018 11.6* 12.0 - 16.0 g/dL Final  . HCT 06/15/2018 33.9* 35.0 - 47.0 % Final  . MCV 06/15/2018 88.1  80.0 - 100.0 fL Final  . MCH 06/15/2018 30.2  26.0 - 34.0 pg Final  . MCHC 06/15/2018 34.3  32.0 - 36.0 g/dL Final  . RDW 06/15/2018 13.4  11.5 - 14.5 % Final  . Platelets 06/15/2018 312  150 - 440 K/uL Final  . Neutrophils Relative % 06/15/2018 66  % Final  . Neutro Abs 06/15/2018 4.5  1.4 - 6.5 K/uL Final  . Lymphocytes Relative 06/15/2018 21  % Final  . Lymphs Abs 06/15/2018 1.4  1.0 - 3.6 K/uL Final  . Monocytes Relative 06/15/2018 7  % Final  . Monocytes Absolute 06/15/2018 0.5  0.2 - 0.9 K/uL Final  . Eosinophils Relative 06/15/2018 5  % Final  . Eosinophils Absolute 06/15/2018 0.3  0 - 0.7 K/uL Final  . Basophils Relative 06/15/2018 1  % Final  . Basophils Absolute 06/15/2018 0.1  0 - 0.1 K/uL Final   Performed at [facility], [address] [place], [place] [address]    Assessment:  SANYAH MOLNAR is a 77 y.o. 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 Margins were negative. Estrogen receptor was 0% and progesterone receptor 0%.  She received radiation post lumpectomy.  Bilateral screening mammogram on 07/31/2017 revealed no findings suspicious for malignancy.  She has a longstanding history of a mild normocytic normochromic anemia. Hemoglobin has ranged between 10.6 - 10.9.  She has no evidence of renal insufficiency. Workup for multiple myeloma was negative. She denies any melena or hematochezia.  Colonoscopy 5-6 years ago was negative.  Diet is good.    Ferritin has been followed: 67 on 05/12/2016, 77 on 05/12/2017, and 81 on 06/15/2018.  Symptomatically, she feels good. Exam is unremarkable.  Ferritin is 81.  Plan: 1.  Labs today:  CBC with diff, CMP, ferritin. 2.  Review interim mammogram on 07/31/2017- no evidence of malignancy. 3.  Schedule bilateral mammogram 08/02/2018. 4.  RTC in 1 year for MD assessment and labs (CBC with diff, CMP, ferritin).    Honor Loh, NP  06/15/2018, 10:12 AM   I saw and evaluated the patient, participating in the key portions of the service and reviewing pertinent diagnostic studies and records.  I reviewed the nurse practitioner's note and agree with the findings and the plan.  The assessment and plan were discussed with the patient.  A few questions were asked by the patient and answered.   Nolon Stalls, MD 06/15/2018,10:12 AM

## 2018-06-21 DIAGNOSIS — F330 Major depressive disorder, recurrent, mild: Secondary | ICD-10-CM | POA: Diagnosis not present

## 2018-07-22 ENCOUNTER — Encounter: Payer: Self-pay | Admitting: Genetics

## 2018-07-22 ENCOUNTER — Inpatient Hospital Stay: Payer: PPO

## 2018-07-22 ENCOUNTER — Inpatient Hospital Stay: Payer: PPO | Attending: Oncology | Admitting: Genetics

## 2018-07-22 DIAGNOSIS — Z8042 Family history of malignant neoplasm of prostate: Secondary | ICD-10-CM

## 2018-07-22 DIAGNOSIS — Z7183 Encounter for nonprocreative genetic counseling: Secondary | ICD-10-CM | POA: Diagnosis not present

## 2018-07-22 DIAGNOSIS — Z86000 Personal history of in-situ neoplasm of breast: Secondary | ICD-10-CM | POA: Diagnosis not present

## 2018-07-22 DIAGNOSIS — Z800 Family history of malignant neoplasm of digestive organs: Secondary | ICD-10-CM

## 2018-07-22 DIAGNOSIS — C50919 Malignant neoplasm of unspecified site of unspecified female breast: Secondary | ICD-10-CM | POA: Diagnosis not present

## 2018-07-22 DIAGNOSIS — C50912 Malignant neoplasm of unspecified site of left female breast: Secondary | ICD-10-CM

## 2018-07-22 DIAGNOSIS — Z803 Family history of malignant neoplasm of breast: Secondary | ICD-10-CM

## 2018-07-22 DIAGNOSIS — Z853 Personal history of malignant neoplasm of breast: Secondary | ICD-10-CM

## 2018-07-22 DIAGNOSIS — Z171 Estrogen receptor negative status [ER-]: Principal | ICD-10-CM

## 2018-07-22 NOTE — Progress Notes (Signed)
REFERRING PROVIDER: Venia Carbon, MD [place], [place] [address]  PRIMARY PROVIDER:  Venia Carbon, MD  PRIMARY REASON FOR VISIT:  1. Malignant neoplasm of left breast in female, estrogen receptor negative, unspecified site of breast ([place])   2. Family history of breast cancer   3. Family history of pancreatic cancer   4. Family history of prostate cancer   5. History of breast cancer      HISTORY OF PRESENT ILLNESS:   Ms. Bacot, a 77 y.o. female, was seen for a [facility] cancer genetics consultation at the request of Dr. Silvio Pate due to a personal and family history of cancer.  Ms. Weyland presents to clinic today to discuss the possibility of a hereditary predisposition to cancer, genetic testing, and to further clarify her future cancer risks, as well as potential cancer risks for family members.   In 2009, at the age of 79, Ms. Bassinger was diagnosed with DCIS  of the left  breast. She had a lumpectomy.   She had genetic testing for BRCA1/2 only in 2009.  She reports being notified of updated testing available now and is interested in discussing updated testing.   HORMONAL RISK FACTORS:  Menarche was at age 42.  First live birth at age 60.  Ovaries intact: no.  Hysterectomy: yes.  Menopausal status: postmenopausal.  Colonoscopy: yes; reportedly normal.  Past Medical History:  Diagnosis Date  . Anemia, unspecified   . Bipolar affective disorder ([place])   . Breast cancer ([place])    left  . Bronchitis   . Depression   . Depressive disorder, not elsewhere classified   . Disorder of bone and cartilage, unspecified   . External hemorrhoids   . Family history of breast cancer   . Family history of pancreatic cancer   . Family history of pancreatic cancer   . Family history of prostate cancer   . Genital herpes, unspecified   . IBS (irritable bowel syndrome)   . Lumbago   . Macular degeneration   . Personal history of radiation therapy 2009  .  Spinal stenosis, unspecified region other than cervical     Past Surgical History:  Procedure Laterality Date  . ABDOMINAL HYSTERECTOMY     Total but cervix left  . bilateral tuboplasty    . BREAST LUMPECTOMY Left 6/09   Left; high grade carcinoma in situ  . CYSTOSCOPY WITH RETROGRADE PYELOGRAM, URETEROSCOPY AND STENT PLACEMENT Bilateral 10/07/2017   Procedure: RIGHT CYSTOSCOPY WITH BILATERAL RETROGRADE PYELOGRAM, URETEROSCOPY AND STENT PLACEMENT;  Surgeon: Ardis Hughs, MD;  Location: [facility];  Service: Urology;  Laterality: Bilateral;  . DILATION AND CURETTAGE OF UTERUS     x 2  . HOLMIUM LASER APPLICATION Right 00/93/8182   Procedure: HOLMIUM LASER APPLICATION;  Surgeon: Ardis Hughs, MD;  Location: [facility];  Service: Urology;  Laterality: Right;  . MRI lumbar  05/2007   L4-L5 spinal stenosis  . RECTOCELE REPAIR    . TONSILLECTOMY      Social History   Socioeconomic History  . Marital status: Divorced    Spouse name: Not on file  . Number of children: 1  . Years of education: Not on file  . Highest education level: Not on file  Occupational History  . Occupation: Med Designer, multimedia    Comment: Retired  Scientific laboratory technician  . Financial resource strain: Not on file  . Food insecurity:    Worry: Not on file    Inability:  Not on file  . Transportation needs:    Medical: Not on file    Non-medical: Not on file  Tobacco Use  . Smoking status: Never Smoker  . Smokeless tobacco: Never Used  Substance and Sexual Activity  . Alcohol use: Yes    Alcohol/week: 6.0 - 8.4 oz    Types: 10 - 14 Glasses of wine per week    Comment: 1 cocktail (manhattan) and 1 glass of wine daily  . Drug use: No  . Sexual activity: Not on file  Lifestyle  . Physical activity:    Days per week: Not on file    Minutes per session: Not on file  . Stress: Not on file  Relationships  . Social connections:    Talks on phone: Not on file    Gets together: Not on file    Attends religious service: Not  on file    Active member of club or organization: Not on file    Attends meetings of clubs or organizations: Not on file    Relationship status: Not on file  Other Topics Concern  . Not on file  Social History Narrative   Has living will   Daughter is health care POA-- no alternate (but probably brother)   Would accept resuscitation attempts   Probably wouldn't want tube feedings if cognitively aware   Body donation to [facility] already arranged        FAMILY HISTORY:  We obtained a detailed, 4-generation family history.  Significant diagnoses are listed below: Family History  Problem Relation Age of Onset  . Pancreatic cancer Father 18  . Prostate cancer Father 72  . Alzheimer's disease Father   . Heart attack Father   . Ulcerative colitis Mother   . Breast cancer Mother 26  . Hypertension Brother   . Hyperlipidemia Brother   . Ulcerative colitis Daughter   . Breast cancer Paternal Grandmother 13  . Colon cancer Neg Hx    Ms. Copen has a 7 year-old daughter with no history of cancer.  She has a grandson who is 8. Ms. Korber has a 23 year-old brother with no history of cancer.  He has 2 children.   Ms. Kolden father: died at 84.  He had pancreatic cancer in his 18's and prostate cancer in his early 60's.   Paternal Aunts/Uncles: 1 paternal aunt with no history of cancer.  Paternal cousins: no history of cancer known Paternal grandfather: no history of cancer, deceased Paternal grandmother: Phx of breast cancer in her 12's, died in he r53's  Ms. Lansdale mother: breast cancer dx at 106, died at 78 Maternal Aunts/Uncles: 2 maternal uncles, 3 maternal aunts, no history of cancer.  Maternal cousins: no known history of cancer- limited contact Maternal grandfather: died in elderly years, no hx cancer Maternal grandmother:no hx cancer  Ms. Lungren is unaware of previous family history of genetic testing for hereditary cancer risks. Patient's maternal ancestors are of  [place] descent, and paternal ancestors are of [place] descent. There is no reported Ashkenazi Jewish ancestry. There is no known consanguinity.  GENETIC COUNSELING ASSESSMENT: LEVEDA KENDRIX is a 77 y.o. female with a personal and family history which is somewhat suggestive of a Hereditary Cancer Predisposition Syndrome. We, therefore, discussed and recommended the following at today's visit.   DISCUSSION: We reviewed the characteristics, features and inheritance patterns of hereditary cancer syndromes. We also discussed genetic testing, including the appropriate family members to test, the process of testing, insurance coverage  and turn-around-time for results. We discussed the implications of a negative, positive and/or variant of uncertain significant result. We recommended Ms. Throop pursue genetic testing for the Common Hereditary gene panel.   The Common Hereditary Cancer Panel offered by [facility] includes sequencing and/or deletion duplication testing of the following 47 genes: APC, ATM, AXIN2, BARD1, BMPR1A, BRCA1, BRCA2, BRIP1, CDH1, CDKN2A (p14ARF), CDKN2A (p16INK4a), CKD4, CHEK2, CTNNA1, DICER1, EPCAM (Deletion/duplication testing only), GREM1 (promoter region deletion/duplication testing only), KIT, MEN1, MLH1, MSH2, MSH3, MSH6, MUTYH, NBN, NF1, NHTL1, PALB2, PDGFRA, PMS2, POLD1, POLE, PTEN, RAD50, RAD51C, RAD51D, SDHB, SDHC, SDHD, SMAD4, SMARCA4. STK11, TP53, TSC1, TSC2, and VHL.  The following genes were evaluated for sequence changes only: SDHA and HOXB13 c.251G>A variant only.  We discussed that only 5-10% of cancers are associated with a Hereditary cancer predisposition syndrome.  One of the most common hereditary cancer syndromes that increases breast cancer risk is called Hereditary Breast and Ovarian Cancer (HBOC) syndrome.  This syndrome is caused by mutations in the BRCA1 and BRCA2 genes.  This syndrome increases an individual's lifetime risk to develop breast, ovarian, pancreatic,  and other types of cancer.  There are also many other cancer predisposition syndromes caused by mutations in several other genes.  We discussed that if she is found to have a mutation in one of these genes, it may impact future medical management recommendations such as increased cancer screenings and consideration of risk reducing surgeries.  A positive result could also have implications for the patient's family members.  A Negative result would mean we were unable to identify a hereditary component to her cancer, but does not rule out the possibility of a hereditary basis for her cancer.  There could be mutations that are undetectable by current technology, or in genes not yet tested or identified to increase cancer risk.    We discussed the potential to find a Variant of Uncertain Significance or VUS.  These are variants that have not yet been identified as pathogenic or benign, and it is unknown if this variant is associated with increased cancer risk or if this is a normal finding.  Most VUS's are reclassified to benign or likely benign.   It should not be used to make medical management decisions. With time, we suspect the lab will determine the significance of any VUS's identified if any.   Based on Ms. Barile personal and family history of cancer, she meets medical criteria for genetic testing. Despite that she meets criteria, she may still have an out of pocket cost. The laboratory can provide her with an estimate of her OOP cost if desired.   PLAN: After considering the risks, benefits, and limitations, Ms. Blomquist  provided informed consent to pursue genetic testing and the blood sample was sent to [facility] for analysis of the Common Hereditary Cancers Panel. Results should be available within approximately 2-3 weeks' time, at which point they will be disclosed by telephone to Ms. Morrish, as will any additional recommendations warranted by these results. Ms. Lacap will receive  a summary of her genetic counseling visit and a copy of her results once available. This information will also be available in Epic. We encouraged Ms. Vacca to remain in contact with cancer genetics annually so that we can continuously update the family history and inform her of any changes in cancer genetics and testing that may be of benefit for her family. Ms. Gentz questions were answered to her satisfaction today. Our contact information was provided should additional  questions or concerns arise.  Based on Ms. Idris family history, we recommended her brother, and paternal relatives also have genetic counseling and testing. Ms. Nardo will let us know if we can be of any assistance in coordinating genetic counseling and/or testing for this family member.   Lastly, we encouraged Ms. Kirshenbaum to remain in contact with cancer genetics annually so that we can continuously update the family history and inform her of any changes in cancer genetics and testing that may be of benefit for this family.   Ms.  Winnie questions were answered to her satisfaction today. Our contact information was provided should additional questions or concerns arise. Thank you for the referral and allowing [place] to share in the care of your patient.   Tana Felts, MS, [facility] Certified Genetic Counselor [email or web address] phone: [phone]  The patient was seen for a total of 40 minutes in face-to-face genetic counseling.

## 2018-08-02 ENCOUNTER — Ambulatory Visit
Admission: RE | Admit: 2018-08-02 | Discharge: 2018-08-02 | Disposition: A | Discharge status: Home / Self Care | Payer: PPO | Source: Ambulatory Visit | Attending: Urgent Care | Admitting: Urgent Care

## 2018-08-02 DIAGNOSIS — Z171 Estrogen receptor negative status [ER-]: Principal | ICD-10-CM

## 2018-08-02 DIAGNOSIS — Z1231 Encounter for screening mammogram for malignant neoplasm of breast: Secondary | ICD-10-CM | POA: Diagnosis not present

## 2018-08-02 DIAGNOSIS — C50912 Malignant neoplasm of unspecified site of left female breast: Secondary | ICD-10-CM

## 2018-08-09 ENCOUNTER — Telehealth: Payer: Self-pay | Admitting: Genetics

## 2018-08-11 NOTE — Telephone Encounter (Signed)
Revealed negative genetic testing.  Revealed that a VUS in TSC2 was identified.   This normal result is reassuring and indicates that it is unlikely Adrienne [name] cancer is due to a hereditary cause.  It is unlikely that there is an increased risk of another cancer due to a mutation in one of these genes.  However, genetic testing is not perfect, and cannot definitively rule out a hereditary cause.  It will be important for her to keep in contact with genetics to learn if any additional testing may be needed in the future.     We recommended she continue to follow her doctors screening and management recommendations.  We recommended her brother and any paternal relatives also have genetic testing due to the paternal family history.

## 2018-08-12 ENCOUNTER — Ambulatory Visit: Payer: Self-pay | Admitting: Genetics

## 2018-08-12 ENCOUNTER — Encounter: Payer: Self-pay | Admitting: Genetics

## 2018-08-12 DIAGNOSIS — Z803 Family history of malignant neoplasm of breast: Secondary | ICD-10-CM

## 2018-08-12 DIAGNOSIS — Z853 Personal history of malignant neoplasm of breast: Secondary | ICD-10-CM

## 2018-08-12 DIAGNOSIS — C50912 Malignant neoplasm of unspecified site of left female breast: Secondary | ICD-10-CM

## 2018-08-12 DIAGNOSIS — Z8042 Family history of malignant neoplasm of prostate: Secondary | ICD-10-CM

## 2018-08-12 DIAGNOSIS — Z1379 Encounter for other screening for genetic and chromosomal anomalies: Secondary | ICD-10-CM

## 2018-08-12 DIAGNOSIS — Z171 Estrogen receptor negative status [ER-]: Secondary | ICD-10-CM

## 2018-08-12 DIAGNOSIS — Z800 Family history of malignant neoplasm of digestive organs: Secondary | ICD-10-CM

## 2018-08-12 NOTE — Progress Notes (Signed)
HPI:  Adrienne [name] was previously seen in the [place] clinic on 07/22/2018 due to a personal and family history of cancer and concerns regarding a hereditary predisposition to cancer. Please refer to our prior cancer genetics clinic note for more information regarding Adrienne [name] medical, social and family histories, and our assessment and recommendations, at the time. Adrienne [name] recent genetic test results were disclosed to her, as well as recommendations warranted by these results. These results and recommendations are discussed in more detail below.  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 She had a lumpectomy.   She had genetic testing for BRCA1/2 only in 2009 that was negative.     FAMILY HISTORY:  We obtained a detailed, 4-generation family history.  Significant diagnoses are listed below: Family History  Problem Relation Age of Onset  . Pancreatic cancer Father 53  . Prostate cancer Father 19  . Alzheimer's disease Father   . Heart attack Father   . Ulcerative colitis Mother   . Breast cancer Mother 51  . Hypertension Brother   . Hyperlipidemia Brother   . Ulcerative colitis Daughter   . Breast cancer Paternal Grandmother 22  . Colon cancer Neg Hx     Adrienne [name] has a 10 year-old daughter with no history of cancer.  She has a grandson who is 8. Adrienne [name] has a 66 year-old brother with no history of cancer.  He has 2 children.   Adrienne [name] father: died at 11.  He had pancreatic cancer in his 51's and prostate cancer in his early 52's.   Paternal Aunts/Uncles: 1 paternal aunt with no history of cancer.  Paternal cousins: no history of cancer known Paternal grandfather: no history of cancer, deceased Paternal grandmother: Phx of breast cancer in her 55's, died in he r4's  Adrienne [name] mother: breast cancer dx at 82, died at 92 Maternal Aunts/Uncles: 2 maternal uncles, 3 maternal aunts, no  history of cancer.  Maternal cousins: no known history of cancer- limited contact Maternal grandfather: died in elderly years, no hx cancer Maternal grandmother:no hx cancer  Adrienne [name] is unaware of previous family history of genetic testing for hereditary cancer risks. Patient's maternal ancestors are of [place] descent, and paternal ancestors are of [place] descent. There is no reported Ashkenazi Jewish ancestry. There is no known consanguinity.  GENETIC TEST RESULTS: Genetic testing performed through [facility]'s Common Hereditary Cancers Panel reported out on 07/30/2018 showed no pathogenic mutations. The Common Hereditary Cancer Panel offered by [facility] includes sequencing and/or deletion duplication testing of the following 47 genes: APC, ATM, AXIN2, BARD1, BMPR1A, BRCA1, BRCA2, BRIP1, CDH1, CDKN2A (p14ARF), CDKN2A (p16INK4a), CKD4, CHEK2, CTNNA1, DICER1, EPCAM (Deletion/duplication testing only), GREM1 (promoter region deletion/duplication testing only), KIT, MEN1, MLH1, MSH2, MSH3, MSH6, MUTYH, NBN, NF1, NHTL1, PALB2, PDGFRA, PMS2, POLD1, POLE, PTEN, RAD50, RAD51C, RAD51D, SDHB, SDHC, SDHD, SMAD4, SMARCA4. STK11, TP53, TSC1, TSC2, and VHL.  The following genes were evaluated for sequence changes only: SDHA and HOXB13 c.251G>A variant only..  A variant of uncertain significance (VUS) in a gene called TSC2 was also noted. c.2096A>G (p.Gln699Arg)  The test report will be scanned into EPIC and will be located under the Molecular Pathology section of the Results Review tab. A portion of the result report is included below for reference.     We discussed with Adrienne [name] that because current genetic testing is not perfect, it is possible  there may be a gene mutation in one of these genes that current testing cannot detect, but that chance is small.  We also discussed, that there could be another gene that has not yet been discovered, or that we have not yet tested, that is responsible for the cancer  diagnoses in the family. It is also possible there is a hereditary cause for the cancer in the family that Adrienne [name] did not inherit and therefore was not identified in her testing.  Therefore, it is important to remain in touch with cancer genetics in the future so that we can continue to offer Adrienne [name] the most up to date genetic testing.   Regarding the VUS in TSC2: At this time, it is unknown if this variant is associated with increased cancer risk or if this is a normal finding, but most variants such as this get reclassified to being inconsequential. It should not be used to make medical management decisions. With time, we suspect the lab will determine the significance of this variant, if any. If we do learn more about it, we will try to contact Adrienne [name] to discuss it further. However, it is important to stay in touch with [place] periodically and keep the address and phone number up to date.  ADDITIONAL GENETIC TESTING: We discussed with Adrienne [name] that there are other genes that are associated with increased cancer risk that can be analyzed. The laboratories that offer this testing look at these additional genes via a hereditary cancer gene panel. Should [name] wish to pursue additional genetic testing, we are happy to discuss and coordinate this testing, at any time.    CANCER SCREENING RECOMMENDATIONS: Adrienne [name] test result is considered negative (normal).  This means that we have not identified a hereditary cause for her personal and family history of cancer at this time.   While reassuring, this does not definitively rule out a hereditary predisposition to cancer. It is still possible that there could be genetic mutations that are undetectable by current technology, or genetic mutations in genes that have not been tested or identified to increase cancer risk.  Therefore, it is recommended she continue to follow the cancer management and screening guidelines provided by her oncology  and primary healthcare provider. An individual's cancer risk is not determined by genetic test results alone.  Overall cancer risk assessment includes additional factors such as personal medical history, family history, etc.  These should be used to make a personalized plan for cancer prevention and surveillance.    RECOMMENDATIONS FOR FAMILY MEMBERS:  Relatives in this family might be at some increased risk of developing cancer, over the general population risk, simply due to the family history of cancer.  We recommended women in this family have a yearly mammogram beginning at age 23, or 38 years younger than the earliest onset of cancer, an annual clinical breast exam, and perform monthly breast self-exams. Women in this family should also have a gynecological exam as recommended by their primary provider. All family members should have a colonoscopy by age 35 (or as directed by their doctors).  All family members should inform their physicians about the family history of cancer so their doctors can make the most appropriate screening recommendations for them.   It is also possible there is a hereditary cause for the cancer in Ms. Bloodsworth family that she did not inherit and therefore was not identified in her.   We recommended her brother and paternal relatives also have  genetic counseling and testing. Ms. Salyers will let us know if we can be of any assistance in coordinating genetic counseling and/or testing for these family members.   FOLLOW-UP: Lastly, we discussed with Ms. Mcnamara that cancer genetics is a rapidly advancing field and it is possible that new genetic tests will be appropriate for her and/or her family members in the future. We encouraged her to remain in contact with cancer genetics on an annual basis so we can update her personal and family histories and let her know of advances in cancer genetics that may benefit this family.   Our contact number was provided. Ms. Seigler  questions were answered to her satisfaction, and she knows she is welcome to call us at anytime with additional questions or concerns.   Ferol Luz, MS, [facility] Certified Genetic Counselor [email or web address]

## 2018-09-16 ENCOUNTER — Ambulatory Visit: Payer: PPO | Admitting: Internal Medicine

## 2018-09-28 DIAGNOSIS — Z23 Encounter for immunization: Secondary | ICD-10-CM | POA: Diagnosis not present

## 2018-11-15 DIAGNOSIS — F330 Major depressive disorder, recurrent, mild: Secondary | ICD-10-CM | POA: Diagnosis not present

## 2019-01-10 ENCOUNTER — Ambulatory Visit (INDEPENDENT_AMBULATORY_CARE_PROVIDER_SITE_OTHER): Payer: PPO | Admitting: Internal Medicine

## 2019-01-10 ENCOUNTER — Encounter: Payer: Self-pay | Admitting: Internal Medicine

## 2019-01-10 VITALS — BP 120/74 | HR 86 | Temp 98.3°F | Ht 60.0 in | Wt 134.0 lb

## 2019-01-10 DIAGNOSIS — R29898 Other symptoms and signs involving the musculoskeletal system: Secondary | ICD-10-CM | POA: Insufficient documentation

## 2019-01-10 NOTE — Progress Notes (Signed)
Subjective:    Patient ID: Adrienne [name], female    DOB: 1941/01/29, 78 y.o.   MRN: [id number]  HPI Here due to trouble with her hands  Can't get her fingers on right hand to spread the same as the left No real pain Limiting her ability to write and play the piano Some people think she has tremor  No trouble walking Walks dog ~3 miles a day No stiffness  Current Outpatient Medications on File Prior to Visit  Medication Sig Dispense Refill  . acetaminophen (TYLENOL) 500 MG tablet Take 1,000 mg by mouth every 6 (six) hours as needed for moderate pain.    Marland Kitchen aspirin 81 MG EC tablet Take 81 mg by mouth daily.      . Calcium Carbonate (CALCIUM 600 PO) Take 600 mg by mouth 2 (two) times daily.      . Cholecalciferol (VITAMIN D3) 1000 units CAPS Take 1,000 Units by mouth daily.    . fish oil-omega-3 fatty acids 1000 MG capsule Take 1 g by mouth daily.    . Loperamide HCl (IMODIUM A-D PO) Take 2 mg by mouth daily.     Marland Kitchen LORazepam (ATIVAN) 0.5 MG tablet Take 0.5 mg by mouth at bedtime.     . Multiple Vitamin (MULTIVITAMIN) tablet Take 1 tablet by mouth daily.    . Multiple Vitamins-Minerals (OCUVITE PO) Take 1 tablet by mouth 2 (two) times daily.     . phenelzine (NARDIL) 15 MG tablet Take 30 mg by mouth 2 (two) times daily.     Marland Kitchen triamcinolone cream (KENALOG) 0.1 % Apply 1 application topically 2 (two) times daily as needed. 45 g 1  . [DISCONTINUED] Omega-3-acid Ethyl Esters (LOVAZA PO) Take 2,000 mg by mouth 2 (two) times daily in the am and at bedtime..       No current facility-administered medications on file prior to visit.     Allergies  Allergen Reactions  . Demerol [Meperidine]     Avoid Demerol due to patient taking MAOI drug.  Clementeen Hoof [Iodinated Diagnostic Agents] Other (See Comments)    CARDIAC ARREST  . Penicillins Anaphylaxis    Has patient had a PCN reaction causing immediate rash, facial/tongue/throat swelling, SOB or lightheadedness with hypotension: Yes Has  patient had a PCN reaction causing severe rash involving mucus membranes or skin necrosis: Yes Has patient had a PCN reaction that required hospitalization: No Has patient had a PCN reaction occurring within the last 10 years: Yes If all of the above answers are "NO", then may proceed with Cephalosporin use.   . Metoclopramide Hcl Anxiety and Rash    Past Medical History:  Diagnosis Date  . Anemia, unspecified   . Bipolar affective disorder ([place])   . Breast cancer ([place])    left  . Bronchitis   . Depression   . Depressive disorder, not elsewhere classified   . Disorder of bone and cartilage, unspecified   . External hemorrhoids   . Family history of breast cancer   . Family history of pancreatic cancer   . Family history of pancreatic cancer   . Family history of prostate cancer   . Genital herpes, unspecified   . IBS (irritable bowel syndrome)   . Lumbago   . Macular degeneration   . Personal history of radiation therapy 2009  . Spinal stenosis, unspecified region other than cervical     Past Surgical History:  Procedure Laterality Date  . ABDOMINAL HYSTERECTOMY  Total but cervix left  . bilateral tuboplasty    . BREAST LUMPECTOMY Left 6/09   Left; high grade carcinoma in situ  . CYSTOSCOPY WITH RETROGRADE PYELOGRAM, URETEROSCOPY AND STENT PLACEMENT Bilateral 10/07/2017   Procedure: RIGHT CYSTOSCOPY WITH BILATERAL RETROGRADE PYELOGRAM, URETEROSCOPY AND STENT PLACEMENT;  Surgeon: Ardis Hughs, MD;  Location: [facility];  Service: Urology;  Laterality: Bilateral;  . DILATION AND CURETTAGE OF UTERUS     x 2  . HOLMIUM LASER APPLICATION Right 41/96/2229   Procedure: HOLMIUM LASER APPLICATION;  Surgeon: Ardis Hughs, MD;  Location: [facility];  Service: Urology;  Laterality: Right;  . MRI lumbar  05/2007   L4-L5 spinal stenosis  . RECTOCELE REPAIR    . TONSILLECTOMY      Family History  Problem Relation Age of Onset  . Pancreatic cancer Father 66  . Prostate  cancer Father 22  . Alzheimer's disease Father   . Heart attack Father   . Ulcerative colitis Mother   . Breast cancer Mother 53  . Hypertension Brother   . Hyperlipidemia Brother   . Ulcerative colitis Daughter   . Breast cancer Paternal Grandmother 32  . Colon cancer Neg Hx     Social History   Socioeconomic History  . Marital status: Divorced    Spouse name: Not on file  . Number of children: 1  . Years of education: Not on file  . Highest education level: Not on file  Occupational History  . Occupation: Med Designer, multimedia    Comment: Retired  Scientific laboratory technician  . Financial resource strain: Not on file  . Food insecurity:    Worry: Not on file    Inability: Not on file  . Transportation needs:    Medical: Not on file    Non-medical: Not on file  Tobacco Use  . Smoking status: Never Smoker  . Smokeless tobacco: Never Used  Substance and Sexual Activity  . Alcohol use: Yes    Alcohol/week: 10.0 - 14.0 standard drinks    Types: 10 - 14 Glasses of wine per week    Comment: 1 cocktail (manhattan) and 1 glass of wine daily  . Drug use: No  . Sexual activity: Not on file  Lifestyle  . Physical activity:    Days per week: Not on file    Minutes per session: Not on file  . Stress: Not on file  Relationships  . Social connections:    Talks on phone: Not on file    Gets together: Not on file    Attends religious service: Not on file    Active member of club or organization: Not on file    Attends meetings of clubs or organizations: Not on file    Relationship status: Not on file  . Intimate partner violence:    Fear of current or ex partner: Not on file    Emotionally abused: Not on file    Physically abused: Not on file    Forced sexual activity: Not on file  Other Topics Concern  . Not on file  Social History Narrative   Has living will   Daughter is health care POA-- no alternate (but probably brother)   Would accept resuscitation attempts   Probably wouldn't want tube  feedings if cognitively aware   [facility] to [facility] already arranged      Review of Systems  Appetite is fine Weight stable     Objective:   Physical Exam  Musculoskeletal:  Comments: Distal nodules on hands and some swelling in scattered PIPs---but no tenderness Unable to spread fingers actively on right hand ---but they move passively  Neurological:  Normal gait No weakness           Assessment & Plan:

## 2019-01-10 NOTE — Assessment & Plan Note (Signed)
Doesn't think she has tremor---and is not Parkinsonian Has apparent inability to use interosseus muscles in right hand Will set up with neurology

## 2019-01-11 ENCOUNTER — Encounter: Payer: Self-pay | Admitting: Neurology

## 2019-02-01 ENCOUNTER — Telehealth: Payer: Self-pay | Admitting: *Deleted

## 2019-02-01 DIAGNOSIS — M2540 Effusion, unspecified joint: Secondary | ICD-10-CM

## 2019-02-01 NOTE — Telephone Encounter (Signed)
Spoke to pt who states she was recently seen for hand pain. She is wanting to know if she can have rheumatoid factor labs completed to r/o rheumatoid arthritis before she proceeds with seeing a specialist. pls advise

## 2019-02-01 NOTE — Telephone Encounter (Signed)
Numbness in right 5th digit and pain in her elbow to her hand. Some swelling in her arm joints. Really wants to see a specialist: Neuro or rheumatologist. Asking if RA blood testing could be done.

## 2019-02-01 NOTE — Telephone Encounter (Signed)
The problems she has are not related to rheumatoid arthritis (which causes swelling and pain in the joints--not trouble moving the fingers)

## 2019-02-02 NOTE — Telephone Encounter (Signed)
She kept telling me yesterday (as noted) that she was having numbness in right 5th digit and pain in her elbow to her hand. Some swelling in her arm joints.  Her OV note was not very detailed so I cannot compare what she said on the phone to what she said in the [facility].

## 2019-02-02 NOTE — Telephone Encounter (Signed)
Okay--I put in the orders

## 2019-02-02 NOTE — Telephone Encounter (Signed)
She does not have ANY of the symptoms of rheumatoid arthritis---she has weakness in her fingers. A positive rheumatoid factor (which is not unusual) is far more likely to be a false positive than a true positive. I told her in email----RA is a disease with specific joint swelling and pain. Is there something else going on that she didn't tell me about at her visit?

## 2019-02-02 NOTE — Telephone Encounter (Signed)
I made a referral to neurology at her visit---what happened with that??

## 2019-02-02 NOTE — Telephone Encounter (Signed)
Patient scheduled to see Dr. Posey Pronto on 03/21/19 at 12:50. Thank you for the Referral.

## 2019-02-02 NOTE — Telephone Encounter (Signed)
Tried to call pt. No answer. She needs to schedule a nonfasting lab visit

## 2019-02-02 NOTE — Telephone Encounter (Signed)
Spoke to pt. She said she wants to have bloodwork done for RA workup before going to Neurology in case it is elevated. Then she would not be wasting her time with neuro. 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 It would make her feel better.

## 2019-02-03 ENCOUNTER — Other Ambulatory Visit: Payer: PPO

## 2019-02-04 ENCOUNTER — Other Ambulatory Visit (INDEPENDENT_AMBULATORY_CARE_PROVIDER_SITE_OTHER): Payer: PPO

## 2019-02-04 ENCOUNTER — Other Ambulatory Visit: Payer: Self-pay

## 2019-02-04 DIAGNOSIS — M2540 Effusion, unspecified joint: Secondary | ICD-10-CM

## 2019-02-04 LAB — SEDIMENTATION RATE: Sed Rate: 22 mm/hr (ref 0–30)

## 2019-02-07 LAB — RHEUMATOID FACTOR: Rhuematoid fact SerPl-aCnc: <14 IU/mL (ref <14)

## 2019-03-10 NOTE — Progress Notes (Signed)
[facility] Clinic Note - Initial Visit   Date: 03/11/19  Adrienne [name] MRN: [id number] DOB: Sep 11, 1941   Dear Dr. Silvio Pate:  Thank you for your kind referral of Adrienne [name] for consultation of bilateral hand weakness. Although her history is well known to you, please allow [place] to reiterate it for the purpose of our medical record. The patient was accompanied to the clinic by self.    History of Present Illness: Adrienne [name] is a 78 y.o. right-handed Caucasian female with left breast cancer s/p lumpectomy (2009), and depression presenting for evaluation of bilateral hand weakness.   Starting in December 2019, she began noticing difficulty with writing and was too embarrassed of her handwriting that she did not write Christmas cards this year.  This prompted her to see her PCP where she reported having difficulty trying to spread her fingers apart on the right hand, such as when playing the piano. She does not have neck pain or numbness/tingling.  She does have difficulty opening jars and bottles on the right hand.  She also has some weakness of the left hand, but not as severe as the right.    Out-side paper records, electronic medical record, and images have been reviewed where available and summarized as:  Lab Results  Component Value Date   [id number] 854 11/30/2014   Lab Results  Component Value Date   TSH 1.645 11/30/2014   Lab Results  Component Value Date   ESRSEDRATE 22 02/04/2019    Past Medical History:  Diagnosis Date  . Anemia, unspecified   . Bipolar affective disorder ([place])   . Breast cancer ([place])    left  . Bronchitis   . Depression   . Depressive disorder, not elsewhere classified   . Disorder of bone and cartilage, unspecified   . External hemorrhoids   . Family history of breast cancer   . Family history of pancreatic cancer   . Family history of pancreatic cancer   . Family history of prostate cancer   .  Genital herpes, unspecified   . IBS (irritable bowel syndrome)   . Lumbago   . Macular degeneration   . Personal history of radiation therapy 2009  . Spinal stenosis, unspecified region other than cervical     Past Surgical History:  Procedure Laterality Date  . ABDOMINAL HYSTERECTOMY     Total but cervix left  . bilateral tuboplasty    . BREAST LUMPECTOMY Left 6/09   Left; high grade carcinoma in situ  . CYSTOSCOPY WITH RETROGRADE PYELOGRAM, URETEROSCOPY AND STENT PLACEMENT Bilateral 10/07/2017   Procedure: RIGHT CYSTOSCOPY WITH BILATERAL RETROGRADE PYELOGRAM, URETEROSCOPY AND STENT PLACEMENT;  Surgeon: Ardis Hughs, MD;  Location: [facility];  Service: Urology;  Laterality: Bilateral;  . DILATION AND CURETTAGE OF UTERUS     x 2  . HOLMIUM LASER APPLICATION Right 62/70/3500   Procedure: HOLMIUM LASER APPLICATION;  Surgeon: Ardis Hughs, MD;  Location: [facility];  Service: Urology;  Laterality: Right;  . MRI lumbar  05/2007   L4-L5 spinal stenosis  . RECTOCELE REPAIR    . TONSILLECTOMY       Medications:  Outpatient Encounter Medications as of 03/11/2019  Medication Sig Note  . acetaminophen (TYLENOL) 500 MG tablet Take 1,000 mg by mouth every 6 (six) hours as needed for moderate pain.   Marland Kitchen aspirin 81 MG EC tablet Take 81 mg by mouth daily.     . Calcium Carbonate (CALCIUM 600 PO) Take  600 mg by mouth 2 (two) times daily.     . Cholecalciferol (VITAMIN D3) 1000 units CAPS Take 1,000 Units by mouth daily.   . fish oil-omega-3 fatty acids 1000 MG capsule Take 1 g by mouth daily.   . Loperamide HCl (IMODIUM A-D PO) Take 2 mg by mouth daily.    Marland Kitchen LORazepam (ATIVAN) 0.5 MG tablet Take 0.5 mg by mouth at bedtime.    . Multiple Vitamin (MULTIVITAMIN) tablet Take 1 tablet by mouth daily. 10/05/2017: Patient says didn't see a strengh/unit on the bottle.  . Multiple Vitamins-Minerals (OCUVITE PO) Take 1 tablet by mouth 2 (two) times daily.    . phenelzine (NARDIL) 15 MG tablet Take  30 mg by mouth 2 (two) times daily.    . [DISCONTINUED] Omega-3-acid Ethyl Esters (LOVAZA PO) Take 2,000 mg by mouth 2 (two) times daily in the am and at bedtime..     . [DISCONTINUED] triamcinolone cream (KENALOG) 0.1 % Apply 1 application topically 2 (two) times daily as needed.    No facility-administered encounter medications on file as of 03/11/2019.     Allergies:  Allergies  Allergen Reactions  . Demerol [Meperidine]     Avoid Demerol due to patient taking MAOI drug.  Clementeen Hoof [Iodinated Diagnostic Agents] Other (See Comments)    CARDIAC ARREST  . Penicillins Anaphylaxis    Has patient had a PCN reaction causing immediate rash, facial/tongue/throat swelling, SOB or lightheadedness with hypotension: Yes Has patient had a PCN reaction causing severe rash involving mucus membranes or skin necrosis: Yes Has patient had a PCN reaction that required hospitalization: No Has patient had a PCN reaction occurring within the last 10 years: Yes If all of the above answers are "NO", then may proceed with Cephalosporin use.   . Metoclopramide Hcl Anxiety and Rash    Family History: Family History  Problem Relation Age of Onset  . Pancreatic cancer Father 25  . Prostate cancer Father 82  . Alzheimer's disease Father   . Heart attack Father   . Ulcerative colitis Mother   . Breast cancer Mother 75  . Hypertension Brother   . Hyperlipidemia Brother   . Ulcerative colitis Daughter   . Breast cancer Paternal Grandmother 8  . Colon cancer Neg Hx     Social History: Social History   Tobacco Use  . Smoking status: Never Smoker  . Smokeless tobacco: Never Used  Substance Use Topics  . Alcohol use: Yes    Alcohol/week: 10.0 - 14.0 standard drinks    Types: 10 - 14 Glasses of wine per week    Comment: 1 cocktail (manhattan) and 1 glass of wine daily  . Drug use: No   Social History   Social History Narrative   Has living will   Daughter is health care POA-- no alternate (but  probably brother)   Would accept resuscitation attempts   Probably wouldn't want tube feedings if cognitively aware   Body donation to [facility] already arranged       Review of Systems:  CONSTITUTIONAL: No fevers, chills, night sweats, or weight loss.   EYES: No visual changes or eye pain ENT: No hearing changes.  No history of nose bleeds.   RESPIRATORY: No cough, wheezing and shortness of breath.   CARDIOVASCULAR: Negative for chest pain, and palpitations.   GI: Negative for abdominal discomfort, blood in stools or black stools.  No recent change in bowel habits.   GU:  No history of incontinence.  MUSCLOSKELETAL: No history of joint pain or swelling.  No myalgias.   SKIN: Negative for lesions, rash, and itching.   HEMATOLOGY/ONCOLOGY: Negative for prolonged bleeding, bruising easily, and swollen nodes.  No history of cancer.   ENDOCRINE: Negative for cold or heat intolerance, polydipsia or goiter.   PSYCH:  No depression +anxiety symptoms.   NEURO: As Above.   Vital Signs:  BP 140/72   Pulse 87   Temp 98.5 F (36.9 C)   Ht 5\' 1"  (1.549 m)   Wt 134 lb (60.8 kg)   SpO2 96%   BMI 25.32 kg/m    General Medical Exam:   General:  Well appearing, comfortable.  Intermittent psychomotor agitation.  Eyes/ENT: see cranial nerve examination.   Neck:   No carotid bruits. Respiratory:  Clear to auscultation, good air entry bilaterally.   Cardiac:  Regular rate and rhythm, no murmur.   Extremities:  No deformities, edema, or skin discoloration.  Skin:  No rashes or lesions.  Neurological Exam: MENTAL STATUS including orientation to time, place, person, recent and remote memory, attention span and concentration, language, and fund of knowledge is normal.  Speech is not dysarthric.  CRANIAL NERVES: II:  No visual field defects.  Unremarkable fundi.   III-IV-VI: Pupils equal round and reactive to light.  Normal conjugate, extra-ocular eye movements in all directions of gaze.  No  nystagmus.  No ptosis.   V:  Normal facial sensation.    VII:  Normal facial symmetry and movements.   VIII:  Normal hearing and vestibular function.   IX-X:  Normal palatal movement.   XI:  Normal shoulder shrug and head rotation.   XII:  Normal tongue strength and range of motion, no deviation or fasciculation.  MOTOR:  Severe wasting of the intrinsic hand muscles on the right hand.  ABP is preserved. Intermittent chreiform-like movements of the arms and legs.   No pronator drift.   Upper Extremity:  Right  Left  Deltoid  5/5   5/5   Biceps  5/5   5/5   Triceps  5/5   5/5   Infraspinatus 5/5  5/5  Medial pectoralis 5/5  5/5  Wrist extensors  5/5   5/5   Wrist flexors  5/5   5/5   Finger extensors  5-/5   5/5   Finger flexors  4/5   5/5   Dorsal interossei  3/5   4+/5   Abductor pollicis  5-/5   5/5   Tone (Ashworth scale)  0  0   Lower Extremity:  Right  Left  Hip flexors  5/5   5/5   Hip extensors  5/5   5/5   Adductor 5/5  5/5  Abductor 5/5  5/5  Knee flexors  5/5   5/5   Knee extensors  5/5   5/5   Dorsiflexors  5/5   5/5   Plantarflexors  5/5   5/5   Toe extensors  5/5   5/5   Toe flexors  5/5   5/5   Tone (Ashworth scale)  0  0   MSRs:  Right        Left                  brachioradialis 2+  3+  biceps 2+  3+  triceps 2+  2+  patellar 2+  2+  ankle jerk 2+  2+  Hoffman no  no  plantar response down  down   SENSORY:  Normal and symmetric perception of light touch, pinprick, vibration, and proprioception.     COORDINATION/GAIT: Normal finger-to- nose-finger.  Intact rapid alternating movements bilaterally.  Gait narrow based and stable.    IMPRESSION: Right hand weakness and muscle atrophy concerning for ulnar neuropathy vs C8 radiculopathy.  - NCS/EMG of the right > left arm will be performed to localize symptoms  - MRI cervical spine wo contrast to check for spinal stenosis, given her asymmetrical hyperreflexia  Further recommendations pending results.     Thank you for allowing me to participate in patient's care.  If I can answer any additional questions, I would be pleased to do so.    Sincerely,    Gregor Dershem K. Posey Pronto, DO

## 2019-03-11 ENCOUNTER — Encounter: Payer: Self-pay | Admitting: Neurology

## 2019-03-11 ENCOUNTER — Ambulatory Visit (INDEPENDENT_AMBULATORY_CARE_PROVIDER_SITE_OTHER): Payer: PPO | Admitting: Neurology

## 2019-03-11 ENCOUNTER — Other Ambulatory Visit: Payer: Self-pay

## 2019-03-11 VITALS — BP 140/72 | HR 87 | Temp 98.5°F | Ht 61.0 in | Wt 134.0 lb

## 2019-03-11 DIAGNOSIS — R292 Abnormal reflex: Secondary | ICD-10-CM | POA: Diagnosis not present

## 2019-03-11 DIAGNOSIS — R29898 Other symptoms and signs involving the musculoskeletal system: Secondary | ICD-10-CM | POA: Diagnosis not present

## 2019-03-11 DIAGNOSIS — M62549 Muscle wasting and atrophy, not elsewhere classified, unspecified hand: Secondary | ICD-10-CM | POA: Diagnosis not present

## 2019-03-11 NOTE — Patient Instructions (Addendum)
NCS/EMG of right > left arm  MRI cervical spine without contrast  We have sent a referral to [facility] for your MRI and they will call you directly to schedule your appt. They are located at [place]. If you need to contact them directly please call [phone].   ELECTROMYOGRAM AND NERVE CONDUCTION STUDIES (EMG/NCS) INSTRUCTIONS  How to Prepare The neurologist conducting the EMG will need to know if you have certain medical conditions. Tell the neurologist and other EMG lab personnel if you: . Have a pacemaker or any other electrical medical device . Take blood-thinning medications . Have hemophilia, a blood-clotting disorder that causes prolonged bleeding Bathing Take a shower or bath shortly before your exam in order to remove oils from your skin. Don't apply lotions or creams before the exam.  What to Expect You'll likely be asked to change into a hospital gown for the procedure and lie down on an examination table. The following explanations can help you understand what will happen during the exam.  . Electrodes. The neurologist or a technician places surface electrodes at various locations on your skin depending on where you're experiencing symptoms. Or the neurologist may insert needle electrodes at different sites depending on your symptoms.  . Sensations. The electrodes will at times transmit a tiny electrical current that you may feel as a twinge or spasm. The needle electrode may cause discomfort or pain that usually ends shortly after the needle is removed. If you are concerned about discomfort or pain, you may want to talk to the neurologist about taking a short break during the exam.  . Instructions. During the needle EMG, the neurologist will assess whether there is any spontaneous electrical activity when the muscle is at rest - activity that isn't present in healthy muscle tissue - and the degree of activity when you slightly contract the muscle.  He or  she will give you instructions on resting and contracting a muscle at appropriate times. Depending on what muscles and nerves the neurologist is examining, he or she may ask you to change positions during the exam.  After your EMG You may experience some temporary, minor bruising where the needle electrode was inserted into your muscle. This bruising should fade within several days. If it persists, contact your primary care doctor.

## 2019-03-14 ENCOUNTER — Telehealth: Payer: Self-pay | Admitting: Neurology

## 2019-03-14 NOTE — Telephone Encounter (Signed)
FYI

## 2019-03-14 NOTE — Telephone Encounter (Signed)
Patient wants to hold off on the MRI until after the cornoavirus is over

## 2019-03-15 ENCOUNTER — Ambulatory Visit (INDEPENDENT_AMBULATORY_CARE_PROVIDER_SITE_OTHER): Payer: PPO | Admitting: Neurology

## 2019-03-15 ENCOUNTER — Other Ambulatory Visit: Payer: Self-pay

## 2019-03-15 DIAGNOSIS — M62549 Muscle wasting and atrophy, not elsewhere classified, unspecified hand: Secondary | ICD-10-CM | POA: Diagnosis not present

## 2019-03-15 DIAGNOSIS — R29898 Other symptoms and signs involving the musculoskeletal system: Secondary | ICD-10-CM

## 2019-03-15 DIAGNOSIS — G5621 Lesion of ulnar nerve, right upper limb: Secondary | ICD-10-CM

## 2019-03-15 DIAGNOSIS — G5602 Carpal tunnel syndrome, left upper limb: Secondary | ICD-10-CM

## 2019-03-15 DIAGNOSIS — R292 Abnormal reflex: Secondary | ICD-10-CM

## 2019-03-15 NOTE — Procedures (Signed)
[facility]  [address], [address]  [place], [place] [address] Tel: [phone] Fax:  [phone] Test Date:  03/15/2019  Patient: Adrienne [name] DOB: 1941/12/21 Physician: Narda Amber, DO  Sex: Female Height: 5\' 1"  Ref Phys: Narda Amber, DO  ID#: [id number] Temp: 36.0C Technician:    Patient Complaints: This is a 78 year old female referred for evaluation of right hand weakness and atrophy.  NCV & EMG Findings: Extensive electrodiagnostic testing of the right upper extremity and additional studies of the left shows:  1. Right ulnar sensory response is absent.  Left median sensory response shows mildly prolonged latency (3.9 ms).  Right median and left ulnar sensory responses are within normal limits. 2. Right ulnar motor response at the first dorsal interosseous and abductor digit he minimi is absent.  Left median motor response is mildly prolonged (4.1 ms).  Right median and left ulnar motor responses are within normal limits.   3. Despite maximal activation, no motor unit recruitment is seen in the first dorsal interosseous or abductor digit he minimi muscles.  Reduced recruitment pattern is seen in the right flexor carpi ulnaris muscle.  There is evidence of fibrillation potentials in the ulnar innervated muscles on the right.  Impression: 1. Subacute right ulnar neuropathy, at or above the takeoff to the flexor carpi ulnaris muscle, which is very severe in degree electrically. 2. Left median neuropathy at or distal to the wrist, consistent with a clinical diagnosis of carpal tunnel syndrome, mild in degree electrically.   ___________________________ Narda Amber, DO    Nerve Conduction Studies Anti Sensory Summary Table   Site NR Peak (ms) Norm Peak (ms) P-T Amp (V) Norm P-T Amp  Left Median Anti Sensory (2nd Digit)  36C  Wrist    3.9 <3.8 12.4 >10  Right Median Anti Sensory (2nd Digit)  36C  Wrist    3.3 <3.8 14.3 >10  Left Ulnar Anti Sensory (5th  Digit)  36C  Wrist    2.8 <3.2 27.0 >5  Right Ulnar Anti Sensory (5th Digit)  36C  Wrist NR  <3.2  >5   Motor Summary Table   Site NR Onset (ms) Norm Onset (ms) O-P Amp (mV) Norm O-P Amp Site1 Site2 Delta-0 (ms) Dist (cm) Vel (m/s) Norm Vel (m/s)  Left Median Motor (Abd Poll Brev)  36C  Wrist    4.1 <4.0 5.4 >5 Elbow Wrist 5.1 28.0 55 >50  Elbow    9.2  4.7         Right Median Motor (Abd Poll Brev)  36C  Wrist    4.0 <4.0 5.8 >5 Elbow Wrist 0.2 28.0 1400 >50  Elbow    3.8  6.1  Axilla Elbow 4.8 0.0    Axilla    8.6  5.3         Left Ulnar Motor (Abd Dig Minimi)  36C  Wrist    2.2 <3.1 8.6 >7 B Elbow Wrist 3.7 22.0 59 >50  B Elbow    5.9  8.6  A Elbow B Elbow 1.8 10.0 56 >50  A Elbow    7.7  8.1         Right Ulnar Motor (Abd Dig Minimi)  36C  Wrist NR  <3.1  >7 B Elbow Wrist  0.0  >50  B Elbow NR     A Elbow B Elbow  0.0  >50  A Elbow NR            Right Ulnar (  FDI) Motor (1st DI)  36C  Wrist NR  <4.5  >7 B Elbow Wrist  0.0  >50  B Elbow NR     A Elbow B Elbow  0.0  >50  A Elbow NR              EMG   Side Muscle Ins Act Fibs Psw Fasc Number Recrt Dur Dur. Amp Amp. Poly Poly. Comment  Left Deltoid Nml Nml Nml Nml Nml Nml Nml Nml Nml Nml Nml Nml N/A  Left Abd Poll Brev Nml Nml Nml Nml Nml Nml Nml Nml Nml Nml Nml Nml N/A  Left PronatorTeres Nml Nml Nml Nml Nml Nml Nml Nml Nml Nml Nml Nml N/A  Left Biceps Nml Nml Nml Nml Nml Nml Nml Nml Nml Nml Nml Nml N/A  Left Triceps Nml Nml Nml Nml Nml Nml Nml Nml Nml Nml Nml Nml N/A  Left 1stDorInt Nml Nml Nml Nml Nml Nml Nml Nml Nml Nml Nml Nml N/A  Right Deltoid Nml Nml Nml Nml Nml Nml Nml Nml Nml Nml Nml Nml N/A  Right PronatorTeres Nml Nml Nml Nml Nml Nml Nml Nml Nml Nml Nml Nml N/A  Right Biceps Nml Nml Nml Nml Nml Nml Nml Nml Nml Nml Nml Nml N/A  Right Triceps Nml Nml Nml Nml Nml Nml Nml Nml Nml Nml Nml Nml N/A  Right Ext Indicis Nml Nml Nml Nml Nml Nml Nml Nml Nml Nml Nml Nml N/A  Right Cervical Parasp Low Nml Nml Nml Nml NE  - - - - - - - N/A  Right ABD Dig Min Nml 1+ Nml Nml NE None - - - - - - ATR  Right FlexCarpiUln Nml 2+ Nml Nml 3- Nml Nml Nml Nml Nml Nml Nml N/A  Right 1stDorInt CRD 1+ Nml Nml NE None - - - - - - ATR      Waveforms:

## 2019-03-17 ENCOUNTER — Telehealth: Payer: Self-pay | Admitting: Neurology

## 2019-03-17 NOTE — Telephone Encounter (Signed)
Please advise 

## 2019-03-17 NOTE — Telephone Encounter (Signed)
Patient left a VM wanting the results of the test she just had done please call

## 2019-03-18 ENCOUNTER — Telehealth: Payer: Self-pay | Admitting: *Deleted

## 2019-03-18 ENCOUNTER — Telehealth: Payer: Self-pay | Admitting: Neurology

## 2019-03-18 NOTE — Telephone Encounter (Signed)
-----   Message from Alda Berthold, DO sent at 03/18/2019  9:47 AM EDT ----- Please inform patient that her nerve testing shows evidence of a pinched nerve in the right arm, possibly around the elbow what is called ulnar neuropathy, which is severe and causing her hand weakness.  To better isolate the location of nerve impingement, I recommend ultrasound of the ulnar nerve (specify right ulnar nerve in comments).  In the meantime, try to avoid over flexing at the right elbow.  On the left, there is mild carpal tunnel syndrome. Thanks.

## 2019-03-18 NOTE — Telephone Encounter (Signed)
Attempted to contact patient again. No answer and no voicemail.

## 2019-03-18 NOTE — Telephone Encounter (Signed)
Patient left vm that she was calling you back. Thanks!

## 2019-03-18 NOTE — Telephone Encounter (Signed)
Called patient. No answer and no voicemail. 

## 2019-03-18 NOTE — Telephone Encounter (Signed)
Patient is calling back to get results of the EMG please call her. She states that she can not get any e-mails ( I think she means my chart )

## 2019-03-18 NOTE — Telephone Encounter (Signed)
See result note.  

## 2019-03-21 ENCOUNTER — Telehealth: Payer: Self-pay | Admitting: *Deleted

## 2019-03-21 ENCOUNTER — Ambulatory Visit: Payer: PPO | Admitting: Neurology

## 2019-03-21 ENCOUNTER — Other Ambulatory Visit: Payer: Self-pay | Admitting: *Deleted

## 2019-03-21 NOTE — Telephone Encounter (Signed)
Attempted to contact patient.  No answer and no voicemail. 

## 2019-03-21 NOTE — Telephone Encounter (Signed)
-----   Message from Alda Berthold, DO sent at 03/18/2019  9:47 AM EDT ----- Please inform patient that her nerve testing shows evidence of a pinched nerve in the right arm, possibly around the elbow what is called ulnar neuropathy, which is severe and causing her hand weakness.  To better isolate the location of nerve impingement, I recommend ultrasound of the ulnar nerve (specify right ulnar nerve in comments).  In the meantime, try to avoid over flexing at the right elbow.  On the left, there is mild carpal tunnel syndrome. Thanks.

## 2019-03-21 NOTE — Telephone Encounter (Signed)
See result note.  

## 2019-03-21 NOTE — Telephone Encounter (Signed)
Patient given results and instructions.  I will send referral for the [place].

## 2019-03-22 ENCOUNTER — Other Ambulatory Visit: Payer: Self-pay | Admitting: *Deleted

## 2019-03-22 DIAGNOSIS — G5621 Lesion of ulnar nerve, right upper limb: Secondary | ICD-10-CM

## 2019-04-01 ENCOUNTER — Telehealth: Payer: Self-pay

## 2019-04-01 MED ORDER — SULFAMETHOXAZOLE-TRIMETHOPRIM 800-160 MG PO TABS: 1.0000 | ORAL_TABLET | Freq: Two times a day (BID) | ORAL | 0 refills | Status: DC

## 2019-04-01 NOTE — Telephone Encounter (Signed)
3 days ago pt started with blood in urine; pt thought might be from drinking red colored drink; 2 days ago started with lower back pain on both sides; today started with lower abd mid pain that is dull and continuous. No fever, SOB or cough, no travel and no exposure to known covid or flu. No burning or pain when urinates and no frequency. Pt also has hx of kidney stones but pt thinks UTI. [facility] pt is allergic to penicillin. Pt will ck with pharmacy later today.

## 2019-04-05 ENCOUNTER — Encounter: Payer: PPO | Admitting: Neurology

## 2019-06-03 ENCOUNTER — Other Ambulatory Visit: Payer: Self-pay

## 2019-06-03 ENCOUNTER — Telehealth: Payer: Self-pay | Admitting: Neurology

## 2019-06-03 DIAGNOSIS — G5621 Lesion of ulnar nerve, right upper limb: Secondary | ICD-10-CM

## 2019-06-03 DIAGNOSIS — R29898 Other symptoms and signs involving the musculoskeletal system: Secondary | ICD-10-CM

## 2019-06-03 NOTE — Telephone Encounter (Signed)
Aniceto Boss with [facility] radiology called regarding order for [place], she stated that Dr. Posey Pronto needs to speak with radiologist to decide on what type of [place] patient needs as there are two different options depending on the body part. The phone number to contact the radiologist is [phone].

## 2019-06-03 NOTE — Telephone Encounter (Signed)
Spoke with patient regarding ultrasound referral.  [facility] is not currently doing ultrasounds.   New order placed and patient is going to call [facility] scheduling to make appointment.

## 2019-06-03 NOTE — Telephone Encounter (Signed)
Returned call and spoke with [facility] radiologist who confirmed [place] ordered for upper extremity joint space would adequately look at the ulnar nerve.

## 2019-06-03 NOTE — Telephone Encounter (Signed)
Patient called regarding having a Sonogram Referral? Please Call. She was seen on 03/15/19 for an EMG. Please Call. Thanks

## 2019-06-06 ENCOUNTER — Telehealth: Payer: Self-pay | Admitting: Neurology

## 2019-06-06 NOTE — Telephone Encounter (Signed)
Informed pt that [facility] should be calling her with an appt. Instructed her to call the office back if she has not been scheduled by Wed this week.

## 2019-06-06 NOTE — Telephone Encounter (Signed)
Patient is calling in about a sonogram that was supposed to be ordered and she never heard anything from it. Please call her back. Thanks!

## 2019-06-08 NOTE — Telephone Encounter (Signed)
Elaina returned call. States that Dr. Zigmund Daniel and Dairy perform the ulnar nerve ultrasounds and both of them are off today. She will ask them tomorrow about scheduling the pt and call me back.

## 2019-06-08 NOTE — Telephone Encounter (Signed)
PT called stating that [facility] radiology has not called her yet regarding appt, she is getting frustrated because pain is getting worse and is affecting her daily activities. Anything that can be done to help her get an appt with be appreciate it, please call pt to let her know.

## 2019-06-08 NOTE — Telephone Encounter (Signed)
Spoke with Adrienne [name] at [facility]. She asked if she could call me back. Will await return call. Not sure if procedure can be performed there. There are two separate orders that we have placed. One at [facility] and one at [facility]. I will wait for Adrienne [name]'s call proceed as necessary.

## 2019-06-08 NOTE — Telephone Encounter (Signed)
Please follow-up with radiology as they would be contacting patient for appointment.

## 2019-06-09 ENCOUNTER — Telehealth: Payer: Self-pay

## 2019-06-09 NOTE — Telephone Encounter (Addendum)
Left message again for [facility]) not Adrienne [name]. She is at lunch.

## 2019-06-09 NOTE — Telephone Encounter (Signed)
Pt left a message stating that she would go anywhere to have test done. She said that she has called few places but they need more information like what type of test is the one that she needs. Pt was in tears, please call her to give her the information she needs.

## 2019-06-09 NOTE — Telephone Encounter (Signed)
Spoke with [facility]. Dr. Zigmund Daniel or Dr. Venetia Constable will hopefully be able to perform the u/s the week of June 15th or the 22nd. There is a delay in scheduling due to multiple physicians having to take off due to [place] per [place].  She will contact pt and let her know what is going on.

## 2019-06-09 NOTE — Telephone Encounter (Signed)
Pt left a message on Triage Line stating she went to the neurologist in March. Was told they wanted her to have an [place] and said she could not get it at [facility]. Says she cannot get in touch with Dr Posey Pronto to get it scheduled and no one answers. I looked at the note from today to Dr Posey Pronto. Advised her that they were working on it.

## 2019-06-10 ENCOUNTER — Other Ambulatory Visit: Payer: Self-pay | Admitting: Hematology and Oncology

## 2019-06-16 ENCOUNTER — Ambulatory Visit: Payer: PPO | Admitting: Hematology and Oncology

## 2019-06-16 ENCOUNTER — Other Ambulatory Visit: Payer: PPO

## 2019-06-30 ENCOUNTER — Ambulatory Visit
Admission: RE | Admit: 2019-06-30 | Discharge: 2019-06-30 | Disposition: A | Discharge status: Home / Self Care | Payer: PPO | Source: Ambulatory Visit | Attending: Neurology | Admitting: Neurology

## 2019-06-30 DIAGNOSIS — M25421 Effusion, right elbow: Secondary | ICD-10-CM | POA: Diagnosis not present

## 2019-06-30 DIAGNOSIS — G5621 Lesion of ulnar nerve, right upper limb: Secondary | ICD-10-CM

## 2019-06-30 DIAGNOSIS — R29898 Other symptoms and signs involving the musculoskeletal system: Secondary | ICD-10-CM

## 2019-07-04 ENCOUNTER — Telehealth: Payer: Self-pay | Admitting: Neurology

## 2019-07-04 DIAGNOSIS — M4802 Spinal stenosis, cervical region: Secondary | ICD-10-CM

## 2019-07-04 DIAGNOSIS — R292 Abnormal reflex: Secondary | ICD-10-CM

## 2019-07-04 DIAGNOSIS — G5621 Lesion of ulnar nerve, right upper limb: Secondary | ICD-10-CM

## 2019-07-04 DIAGNOSIS — R29898 Other symptoms and signs involving the musculoskeletal system: Secondary | ICD-10-CM

## 2019-07-04 NOTE — Telephone Encounter (Signed)
Called and informed patient that the ultrasound of the right ulnar nerve is normal (10cm proximal to elbow and down into the wrist).  Her EMG in March 2020 showed severe non-localizable proximal ulnar neuropathy on the right.    She has not noticed any progressive weakness or paresthesias, and overall feels that her symptoms are unchanged. She continues to have atrophy of the hand and inability to abduct the fingers to play the piano.   To further investigate his symptoms, I discussed MRI of the right humerus to look for more proximal lesion.  At her office visit, she was noted to have brisk reflexes and MRI cervical spine had been ordered at that time.  She has not had this performed. She will be seeking a 2nd opinion with another neurologist in the next 2 weeks and prefers to hold on imaging of the arm.  She is agreeable to proceed with MRI cervical spine and occupational therapy for hand strengthening.  Jaymes Revels K. Posey Pronto, DO

## 2019-07-05 NOTE — Telephone Encounter (Signed)
MRI and hand therapy ordered.

## 2019-07-14 NOTE — Progress Notes (Signed)
[facility]  [address], [address] [place], [place] [address] Phone: [phone]  Fax: [phone]   Clinic Day:  07/19/2019  Referring physician: Venia Carbon, MD  Chief Complaint: Adrienne [name] is a 78 y.o. female with with a history of DCIS and a normocytic anemia who is seen for 1 year assessment.  HPI: The patient was last seen in the medical oncology clinic on 06/15/2018. At that time, she felt good. Exam was unremarkable. Ferritin was 81.  She was seen by genetics counselor Ferol Luz on 07/22/2018.  [facility] genetic testing on 07/30/2018 revealed no pathogenic mutations. She has a VUS in TSC2. It is unlikely that there is an increased risk of another cancer due to a mutation in one of these genes.   Bilateral mammogram on 08/02/2018 revealed no evidence on malignancy.   During the interim, the patient is doing "fine". She notes not being able to stretch out her right hand ever since 03/21/2019. She notes that she walks her dog three times a week. Her weight is down 4 pounds since last visit (06/15/2018).   She denies having any kidney stones. She notes examining her breast monthly.    Past Medical History:  Diagnosis Date  . Anemia, unspecified   . Bipolar affective disorder ([place])   . Breast cancer ([place])    left  . Bronchitis   . Depression   . Depressive disorder, not elsewhere classified   . Disorder of bone and cartilage, unspecified   . External hemorrhoids   . Family history of breast cancer   . Family history of pancreatic cancer   . Family history of pancreatic cancer   . Family history of prostate cancer   . Genital herpes, unspecified   . IBS (irritable bowel syndrome)   . Lumbago   . Macular degeneration   . Personal history of radiation therapy 2009  . Spinal stenosis, unspecified region other than cervical     Past Surgical History:  Procedure Laterality Date  . ABDOMINAL HYSTERECTOMY     Total but cervix  left  . bilateral tuboplasty    . BREAST LUMPECTOMY Left 6/09   Left; high grade carcinoma in situ  . CYSTOSCOPY WITH RETROGRADE PYELOGRAM, URETEROSCOPY AND STENT PLACEMENT Bilateral 10/07/2017   Procedure: RIGHT CYSTOSCOPY WITH BILATERAL RETROGRADE PYELOGRAM, URETEROSCOPY AND STENT PLACEMENT;  Surgeon: Ardis Hughs, MD;  Location: [facility];  Service: Urology;  Laterality: Bilateral;  . DILATION AND CURETTAGE OF UTERUS     x 2  . HOLMIUM LASER APPLICATION Right 70/96/2836   Procedure: HOLMIUM LASER APPLICATION;  Surgeon: Ardis Hughs, MD;  Location: [facility];  Service: Urology;  Laterality: Right;  . MRI lumbar  05/2007   L4-L5 spinal stenosis  . RECTOCELE REPAIR    . TONSILLECTOMY      Family History  Problem Relation Age of Onset  . Pancreatic cancer Father 13  . Prostate cancer Father 35  . Alzheimer's disease Father   . Heart attack Father   . Ulcerative colitis Mother   . Breast cancer Mother 27  . Hypertension Brother   . Hyperlipidemia Brother   . Ulcerative colitis Daughter   . Breast cancer Paternal Grandmother 3  . Colon cancer Neg Hx     Social History:  reports that she has never smoked. She has never used smokeless tobacco. She reports current alcohol use of about 10.0 - 14.0 standard drinks of alcohol per week. She reports that she does  not use drugs. The patient lives in [place] in [place].. She states that she is into "everything". She is a Hydrographic surveyor. She has a life at [place]. She may have a drink before dinner. The patient is alone today.   Allergies:  Allergies  Allergen Reactions  . Demerol [Meperidine]     Avoid Demerol due to patient taking MAOI drug.  Clementeen Hoof [Iodinated Diagnostic Agents] Other (See Comments)    CARDIAC ARREST  . Penicillins Anaphylaxis    Has patient had a PCN reaction causing immediate rash, facial/tongue/throat swelling, SOB or lightheadedness with hypotension: Yes Has patient had a PCN reaction causing severe  rash involving mucus membranes or skin necrosis: Yes Has patient had a PCN reaction that required hospitalization: No Has patient had a PCN reaction occurring within the last 10 years: Yes If all of the above answers are "NO", then may proceed with Cephalosporin use.   . Metoclopramide Hcl Anxiety and Rash    Current Medications: Current Outpatient Medications  Medication Sig Dispense Refill  . aspirin 81 MG EC tablet Take 81 mg by mouth daily.      . Calcium Carbonate (CALCIUM 600 PO) Take 600 mg by mouth 2 (two) times daily.      . Cholecalciferol (VITAMIN D3) 1000 units CAPS Take 1,000 Units by mouth daily.    . Melatonin 3 MG TABS Take by mouth.    . Multiple Vitamin (MULTIVITAMIN) tablet Take 1 tablet by mouth daily.    . Multiple Vitamins-Minerals (OCUVITE PO) Take 1 tablet by mouth 2 (two) times daily.     . phenelzine (NARDIL) 15 MG tablet Take 30 mg by mouth 2 (two) times daily.     Marland Kitchen acetaminophen (TYLENOL) 500 MG tablet Take 1,000 mg by mouth every 6 (six) hours as needed for moderate pain.    . Loperamide HCl (IMODIUM A-D PO) Take 2 mg by mouth daily.      No current facility-administered medications for this visit.     Review of Systems  Constitutional: Positive for weight loss (4 lbs in 1 year.). Negative for chills, fever and malaise/fatigue.       Doing "fine".  HENT: Negative.  Negative for congestion, ear discharge, ear pain, hearing loss, nosebleeds, sore throat and tinnitus.   Eyes: Negative.  Negative for blurred vision and double vision.  Respiratory: Negative.  Negative for cough, hemoptysis and shortness of breath.   Cardiovascular: Negative.  Negative for chest pain, palpitations, orthopnea, leg swelling and PND.  Gastrointestinal: Negative.  Negative for abdominal pain, blood in stool, constipation, diarrhea, nausea and vomiting.  Genitourinary: Negative for dysuria, hematuria and urgency.       Interval UTI.  Musculoskeletal: Positive for joint pain  (hands). Negative for back pain, falls, myalgias and neck pain.  Skin: Negative.  Negative for itching and rash.  Neurological: Negative.  Negative for dizziness, tingling, sensory change, focal weakness, weakness and headaches.  Endo/Heme/Allergies: Negative.  Does not bruise/bleed easily.  Psychiatric/Behavioral: Negative.  Negative for depression and memory loss. The patient is not nervous/anxious and does not have insomnia.   All other systems reviewed and are negative.  Performance status (ECOG): 0  Vitals Blood pressure (!) 141/80, pulse 92, temperature 97.6 F (36.4 C), temperature source Tympanic, resp. rate 18, height 4' 11.75" (1.518 m), weight 130 lb 6.4 oz (59.1 kg), SpO2 98 %.   Physical Exam  Constitutional: She is oriented to person, place, and time. She appears well-developed and well-nourished. No  distress.  HENT:  Head: Normocephalic and atraumatic.  Mouth/Throat: Oropharynx is clear and moist. No oropharyngeal exudate.  Short gray hair.  Mask.  Eyes: Pupils are equal, round, and reactive to light. Conjunctivae and EOM are normal. No scleral icterus.  Glasses. Brown eyes.  Neck: Normal range of motion. Neck supple. No JVD present.  Cardiovascular: Normal rate, regular rhythm and normal heart sounds.  No murmur heard. Pulmonary/Chest: Effort normal and breath sounds normal. No respiratory distress. She has no wheezes. She has no rales. She exhibits no tenderness.  Superior fibrocystic changes on the right.  Left breast scar with lateral changes (chronic).  Inverted nipple (chronic).  Abdominal: Soft. Bowel sounds are normal. She exhibits no distension and no mass. There is no abdominal tenderness. There is no rebound and no guarding.  Musculoskeletal: Normal range of motion.        General: No tenderness or edema.  Lymphadenopathy:    She has no cervical adenopathy.    She has no axillary adenopathy.       Right: No supraclavicular adenopathy present.       Left: No  supraclavicular adenopathy present.  Neurological: She is alert and oriented to person, place, and time. She has normal reflexes. She displays tremor (slight).  Skin: Skin is warm and dry. No rash noted. She is not diaphoretic. No erythema. No pallor.  Psychiatric: She has a normal mood and affect. Her behavior is normal. Judgment and thought content normal.  Nursing note and vitals reviewed.   Office Visit on 07/18/2019  Component Date Value Ref Range Status  . WBC 07/18/2019 5.5  4.0 - 10.5 K/uL Final  . RBC 07/18/2019 3.86* 3.87 - 5.11 Mil/uL Final  . Hemoglobin 07/18/2019 11.4* 12.0 - 15.0 g/dL Final  . HCT 07/18/2019 34.6* 36.0 - 46.0 % Final  . MCV 07/18/2019 89.7  78.0 - 100.0 fl Final  . MCHC 07/18/2019 32.8  30.0 - 36.0 g/dL Final  . RDW 07/18/2019 13.3  11.5 - 15.5 % Final  . Platelets 07/18/2019 282.0  150.0 - 400.0 K/uL Final  . Neutrophils Relative % 07/18/2019 46.5  43.0 - 77.0 % Final  . Lymphocytes Relative 07/18/2019 37.8  12.0 - 46.0 % Final  . Monocytes Relative 07/18/2019 8.4  3.0 - 12.0 % Final  . Eosinophils Relative 07/18/2019 5.8* 0.0 - 5.0 % Final  . Basophils Relative 07/18/2019 1.5  0.0 - 3.0 % Final  . Neutro Abs 07/18/2019 2.6  1.4 - 7.7 K/uL Final  . Lymphs Abs 07/18/2019 2.1  0.7 - 4.0 K/uL Final  . Monocytes Absolute 07/18/2019 0.5  0.1 - 1.0 K/uL Final  . Eosinophils Absolute 07/18/2019 0.3  0.0 - 0.7 K/uL Final  . Basophils Absolute 07/18/2019 0.1  0.0 - 0.1 K/uL Final  . Sodium 07/18/2019 139  135 - 145 mEq/L Final  . Potassium 07/18/2019 4.6  3.5 - 5.1 mEq/L Final  . Chloride 07/18/2019 102  96 - 112 mEq/L Final  . CO2 07/18/2019 30  19 - 32 mEq/L Final  . Glucose, Bld 07/18/2019 103* 70 - 99 mg/dL Final  . BUN 07/18/2019 13  6 - 23 mg/dL Final  . Creatinine, Ser 07/18/2019 0.77  0.40 - 1.20 mg/dL Final  . Total Bilirubin 07/18/2019 0.4  0.2 - 1.2 mg/dL Final  . Alkaline Phosphatase 07/18/2019 59  39 - 117 U/L Final  . AST 07/18/2019 26  0 - 37  U/L Final  . ALT 07/18/2019 17  0 - 35  U/L Final  . Total Protein 07/18/2019 7.4  6.0 - 8.3 g/dL Final  . Albumin 07/18/2019 4.6  3.5 - 5.2 g/dL Final  . Calcium 07/18/2019 10.0  8.4 - 10.5 mg/dL Final  . GFR 07/18/2019 72.90  >60.00 mL/min Final    Assessment:  Adrienne [name] is a 78 y.o. 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 Margins were negative. Estrogen receptor was 0% and progesterone receptor 0%.  She received radiation post lumpectomy.  Bilateral screening mammogram on 07/31/2017 revealed no findings suspicious for malignancy.  Bilateral mammogram on 08/02/2018 revealed no evidence on malignancy.   She has a longstanding history of a mild normocytic normochromic anemia. Hemoglobin has ranged between 10.6 - 10.9.  She has no evidence of renal insufficiency. Workup for multiple myeloma was negative. She denies any melena or hematochezia.  Colonoscopy 5-6 years ago was negative.  Diet is good.    Ferritin has been followed: 67 on 05/12/2016, 77 on 05/12/2017, 81 on 06/15/2018, and 75 on 07/18/2019.  Symptomatically, she is doing well.  Exam is stable.  Plan: 1.   Review labs from 07/18/2019. 2.   Left breast DCIS  Clinically, she is doing well.  Exam reveals no evidence of recurrent disease.  Bilateral mammogram on 08/02/2018 revealed no evidence of malignancy. 3.   Normocytic anemia  Hematocrit 34.6.  Hemoglobin 11.4.  MCV 89.7.  Ferritin 75. 4.   Mammogram on 08/06/2019. 5.   RTC in 1 year for MD assessment, labs (CBC with diff, CMP), and review of mammogram.  I discussed the assessment and treatment plan with the patient.  The patient was provided an opportunity to ask questions and all were answered.  The patient agreed with the plan and demonstrated an understanding of the instructions.  The patient was advised to call back if  the symptoms worsen or if the condition fails to improve as anticipated.  I provided 13 minutes of face-to-face time during this this encounter and > 50% was spent counseling as documented under my assessment and plan.    Lequita Asal, MD, PhD    07/19/2019, 11:38 AM  I, Selena Batten, am acting as scribe for [employer]. Mike Gip, MD, PhD.  I, Melissa C. Mike Gip, MD, have reviewed the above documentation for accuracy and completeness, and I agree with the above.

## 2019-07-15 DIAGNOSIS — F330 Major depressive disorder, recurrent, mild: Secondary | ICD-10-CM | POA: Diagnosis not present

## 2019-07-18 ENCOUNTER — Encounter: Payer: Self-pay | Admitting: Internal Medicine

## 2019-07-18 ENCOUNTER — Other Ambulatory Visit: Payer: Self-pay

## 2019-07-18 ENCOUNTER — Other Ambulatory Visit: Payer: PPO

## 2019-07-18 ENCOUNTER — Ambulatory Visit (INDEPENDENT_AMBULATORY_CARE_PROVIDER_SITE_OTHER): Payer: PPO | Admitting: Internal Medicine

## 2019-07-18 VITALS — BP 130/80 | HR 85 | Temp 98.0°F | Ht 59.75 in | Wt 129.0 lb

## 2019-07-18 DIAGNOSIS — Z0000 Encounter for general adult medical examination without abnormal findings: Secondary | ICD-10-CM | POA: Diagnosis not present

## 2019-07-18 DIAGNOSIS — D638 Anemia in other chronic diseases classified elsewhere: Secondary | ICD-10-CM

## 2019-07-18 DIAGNOSIS — F339 Major depressive disorder, recurrent, unspecified: Secondary | ICD-10-CM | POA: Diagnosis not present

## 2019-07-18 DIAGNOSIS — G629 Polyneuropathy, unspecified: Secondary | ICD-10-CM

## 2019-07-18 DIAGNOSIS — Z7189 Other specified counseling: Secondary | ICD-10-CM

## 2019-07-18 DIAGNOSIS — C50912 Malignant neoplasm of unspecified site of left female breast: Secondary | ICD-10-CM

## 2019-07-18 LAB — CBC WITH DIFFERENTIAL/PLATELET
Basophils Absolute: 0.1 10*3/uL (ref 0.0–0.1)
Basophils Relative: 1.5 % (ref 0.0–3.0)
Eosinophils Absolute: 0.3 10*3/uL (ref 0.0–0.7)
Eosinophils Relative: 5.8 % — ABNORMAL HIGH (ref 0.0–5.0)
HCT: 34.6 % — ABNORMAL LOW (ref 36.0–46.0)
Hemoglobin: 11.4 g/dL — ABNORMAL LOW (ref 12.0–15.0)
Lymphocytes Relative: 37.8 % (ref 12.0–46.0)
Lymphs Abs: 2.1 10*3/uL (ref 0.7–4.0)
MCHC: 32.8 g/dL (ref 30.0–36.0)
MCV: 89.7 fl (ref 78.0–100.0)
Monocytes Absolute: 0.5 10*3/uL (ref 0.1–1.0)
Monocytes Relative: 8.4 % (ref 3.0–12.0)
Neutro Abs: 2.6 10*3/uL (ref 1.4–7.7)
Neutrophils Relative %: 46.5 % (ref 43.0–77.0)
Platelets: 282 10*3/uL (ref 150.0–400.0)
RBC: 3.86 Mil/uL — ABNORMAL LOW (ref 3.87–5.11)
RDW: 13.3 % (ref 11.5–15.5)
WBC: 5.5 10*3/uL (ref 4.0–10.5)

## 2019-07-18 LAB — COMPREHENSIVE METABOLIC PANEL
ALT: 17 U/L (ref 0–35)
AST: 26 U/L (ref 0–37)
Albumin: 4.6 g/dL (ref 3.5–5.2)
Alkaline Phosphatase: 59 U/L (ref 39–117)
BUN: 13 mg/dL (ref 6–23)
CO2: 30 mEq/L (ref 19–32)
Calcium: 10 mg/dL (ref 8.4–10.5)
Chloride: 102 mEq/L (ref 96–112)
Creatinine, Ser: 0.77 mg/dL (ref 0.40–1.20)
GFR: 72.9 mL/min (ref >60.00)
Glucose, Bld: 103 mg/dL — ABNORMAL HIGH (ref 70–99)
Potassium: 4.6 mEq/L (ref 3.5–5.1)
Sodium: 139 mEq/L (ref 135–145)
Total Bilirubin: 0.4 mg/dL (ref 0.2–1.2)
Total Protein: 7.4 g/dL (ref 6.0–8.3)

## 2019-07-18 NOTE — Progress Notes (Signed)
Subjective:    Patient ID: Adrienne [name], female    DOB: 11/28/1941, 78 y.o.   MRN: [id number]  HPI Here for Medicare wellness visit and follow up of chronic health conditions Reviewed form and advanced directives Reviewed other doctors Walks regularly Still enjoys a [place] or glass of wine with dinner No tobacco Walks daily with her dog No falls Vision and hearing are fine Chronic depression is controlled Independent with instrumental ADLs No sig memory issues  Continues to have trouble with the right hand Seeing Dr Posey Pronto No answers yet Does have cervical spine MRI now scheduled  Has noticed nocturia ---usually once No daytime problems No incontinence  Depression is controlled Continues on the nardil Doing well  Current Outpatient Medications on File Prior to Visit  Medication Sig Dispense Refill  . acetaminophen (TYLENOL) 500 MG tablet Take 1,000 mg by mouth every 6 (six) hours as needed for moderate pain.    Marland Kitchen aspirin 81 MG EC tablet Take 81 mg by mouth daily.      . Calcium Carbonate (CALCIUM 600 PO) Take 600 mg by mouth 2 (two) times daily.      . Cholecalciferol (VITAMIN D3) 1000 units CAPS Take 1,000 Units by mouth daily.    . Loperamide HCl (IMODIUM A-D PO) Take 2 mg by mouth daily.     . Melatonin 3 MG TABS Take by mouth.    . Multiple Vitamin (MULTIVITAMIN) tablet Take 1 tablet by mouth daily.    . Multiple Vitamins-Minerals (OCUVITE PO) Take 1 tablet by mouth 2 (two) times daily.     . phenelzine (NARDIL) 15 MG tablet Take 30 mg by mouth 2 (two) times daily.     . [DISCONTINUED] Omega-3-acid Ethyl Esters (LOVAZA PO) Take 2,000 mg by mouth 2 (two) times daily in the am and at bedtime..       No current facility-administered medications on file prior to visit.     Allergies  Allergen Reactions  . Demerol [Meperidine]     Avoid Demerol due to patient taking MAOI drug.  Clementeen Hoof [Iodinated Diagnostic Agents] Other (See Comments)    CARDIAC ARREST   . Penicillins Anaphylaxis    Has patient had a PCN reaction causing immediate rash, facial/tongue/throat swelling, SOB or lightheadedness with hypotension: Yes Has patient had a PCN reaction causing severe rash involving mucus membranes or skin necrosis: Yes Has patient had a PCN reaction that required hospitalization: No Has patient had a PCN reaction occurring within the last 10 years: Yes If all of the above answers are "NO", then may proceed with Cephalosporin use.   . Metoclopramide Hcl Anxiety and Rash    Past Medical History:  Diagnosis Date  . Anemia, unspecified   . Bipolar affective disorder ([place])   . Breast cancer ([place])    left  . Bronchitis   . Depression   . Depressive disorder, not elsewhere classified   . Disorder of bone and cartilage, unspecified   . External hemorrhoids   . Family history of breast cancer   . Family history of pancreatic cancer   . Family history of pancreatic cancer   . Family history of prostate cancer   . Genital herpes, unspecified   . IBS (irritable bowel syndrome)   . Lumbago   . Macular degeneration   . Personal history of radiation therapy 2009  . Spinal stenosis, unspecified region other than cervical     Past Surgical History:  Procedure Laterality Date  .  ABDOMINAL HYSTERECTOMY     Total but cervix left  . bilateral tuboplasty    . BREAST LUMPECTOMY Left 6/09   Left; high grade carcinoma in situ  . CYSTOSCOPY WITH RETROGRADE PYELOGRAM, URETEROSCOPY AND STENT PLACEMENT Bilateral 10/07/2017   Procedure: RIGHT CYSTOSCOPY WITH BILATERAL RETROGRADE PYELOGRAM, URETEROSCOPY AND STENT PLACEMENT;  Surgeon: Ardis Hughs, MD;  Location: [facility];  Service: Urology;  Laterality: Bilateral;  . DILATION AND CURETTAGE OF UTERUS     x 2  . HOLMIUM LASER APPLICATION Right 84/69/6295   Procedure: HOLMIUM LASER APPLICATION;  Surgeon: Ardis Hughs, MD;  Location: [facility];  Service: Urology;  Laterality: Right;  . MRI lumbar  05/2007    L4-L5 spinal stenosis  . RECTOCELE REPAIR    . TONSILLECTOMY      Family History  Problem Relation Age of Onset  . Pancreatic cancer Father 33  . Prostate cancer Father 20  . Alzheimer's disease Father   . Heart attack Father   . Ulcerative colitis Mother   . Breast cancer Mother 76  . Hypertension Brother   . Hyperlipidemia Brother   . Ulcerative colitis Daughter   . Breast cancer Paternal Grandmother 37  . Colon cancer Neg Hx     Social History   Socioeconomic History  . Marital status: Divorced    Spouse name: Not on file  . Number of children: 1  . Years of education: Not on file  . Highest education level: Associate degree: occupational, Hotel manager, or vocational program  Occupational History  . 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 Financial resource strain: Not on file  . Food insecurity    Worry: Not on file    Inability: Not on file  . Transportation needs    Medical: Not on file    Non-medical: Not on file  Tobacco Use  . Smoking status: Never Smoker  . Smokeless tobacco: Never Used  Substance and Sexual Activity  . Alcohol use: Yes    Alcohol/week: 10.0 - 14.0 standard drinks    Types: 10 - 14 Glasses of wine per week    Comment: 1 cocktail (manhattan) and 1 glass of wine daily  . Drug use: No  . Sexual activity: Not on file  Lifestyle  . Physical activity    Days per week: Not on file    Minutes per session: Not on file  . Stress: Not on file  Relationships  . Social Herbalist on phone: Not on file    Gets together: Not on file    Attends religious service: Not on file    Active member of club or organization: Not on file    Attends meetings of clubs or organizations: Not on file    Relationship status: Not on file  . Intimate partner violence    Fear of current or ex partner: Not on file    Emotionally abused: Not on file    Physically abused: Not on file    Forced sexual activity: Not  on file  Other Topics Concern  . Not on file  Social History Narrative   Has living will   Daughter is health care POA-- no alternate (but probably brother)   Would accept resuscitation attempts   Probably wouldn't want tube feedings if cognitively aware   Body donation to [facility] already arranged      Review of Systems Appetite is good but has lost weight--with  healthier diet Bowels are fine---some urgency at times Likes to garden as well Wears seat belt Teeth are fine---no dentist right now (discussed) No skin lesions or rash No chest pain or SOB No palpitations No dizziness or syncope No heartburn or dysphagia    Objective:   Physical Exam  Constitutional: She is oriented to person, place, and time. She appears well-developed. No distress.  HENT:  Mouth/Throat: Oropharynx is clear and moist. No oropharyngeal exudate.  Neck: No thyromegaly present.  Cardiovascular: Normal rate, regular rhythm, normal heart sounds and intact distal pulses. Exam reveals no gallop.  No murmur heard. Respiratory: Effort normal and breath sounds normal. No respiratory distress. She has no wheezes. She has no rales.  GI: Soft. There is no abdominal tenderness.  Musculoskeletal:        General: No tenderness or edema.  Lymphadenopathy:    She has no cervical adenopathy.  Neurological: She is alert and oriented to person, place, and time.  President--- "Carlos American---- ?" [phone] [id number] Recall 3/3  Skin: No rash noted. No erythema.  Psychiatric: She has a normal mood and affect. Her behavior is normal.           Assessment & Plan:

## 2019-07-18 NOTE — Assessment & Plan Note (Signed)
See social history 

## 2019-07-18 NOTE — Assessment & Plan Note (Signed)
Going to Dr Mike Gip next week

## 2019-07-18 NOTE — Assessment & Plan Note (Signed)
Ongoing trouble with right hand Neurology evaluation ongoing

## 2019-07-18 NOTE — Assessment & Plan Note (Signed)
Controlled with her medication Sees psychiatrist

## 2019-07-18 NOTE — Assessment & Plan Note (Signed)
I have personally reviewed the Medicare Annual Wellness questionnaire and have noted 1. The patient's medical and social history 2. Their use of alcohol, tobacco or illicit drugs 3. Their current medications and supplements 4. The patient's functional ability including ADL's, fall risks, home safety risks and hearing or visual             impairment. 5. Diet and physical activities 6. Evidence for depression or mood disorders  The patients weight, height, BMI and visual acuity have been recorded in the chart I have made referrals, counseling and provided education to the patient based review of the above and I have provided the pt with a written personalized care plan for preventive services.  I have provided you with a copy of your personalized plan for preventive services. Please take the time to review along with your updated medication list.  Will consider last colonoscopy 2023 Yearly mammogram due to history of breast cancer Exercises regularly Will update Td if she gets any injury Looked into shingrix----too expensive Flu vaccine in fall

## 2019-07-18 NOTE — Progress Notes (Addendum)
   Hearing Screening   Method: Audiometry   125Hz  250Hz  500Hz  1000Hz  2000Hz  3000Hz  4000Hz  6000Hz  8000Hz   Right ear:   20 20 20  20     Left ear:   20 20 20  20       Visual Acuity Screening   Right eye Left eye Both eyes  Without correction:     With correction: 20/25 20/25 20/25

## 2019-07-19 ENCOUNTER — Inpatient Hospital Stay: Payer: PPO | Attending: Hematology and Oncology | Admitting: Hematology and Oncology

## 2019-07-19 ENCOUNTER — Inpatient Hospital Stay: Payer: PPO

## 2019-07-19 ENCOUNTER — Telehealth: Payer: Self-pay | Admitting: Neurology

## 2019-07-19 ENCOUNTER — Encounter: Payer: Self-pay | Admitting: Hematology and Oncology

## 2019-07-19 VITALS — BP 141/80 | HR 92 | Temp 97.6°F | Resp 18 | Ht 59.75 in | Wt 130.4 lb

## 2019-07-19 DIAGNOSIS — C50912 Malignant neoplasm of unspecified site of left female breast: Secondary | ICD-10-CM

## 2019-07-19 DIAGNOSIS — M25542 Pain in joints of left hand: Secondary | ICD-10-CM | POA: Insufficient documentation

## 2019-07-19 DIAGNOSIS — Z820 Family history of epilepsy and other diseases of the nervous system: Secondary | ICD-10-CM | POA: Insufficient documentation

## 2019-07-19 DIAGNOSIS — M48061 Spinal stenosis, lumbar region without neurogenic claudication: Secondary | ICD-10-CM | POA: Insufficient documentation

## 2019-07-19 DIAGNOSIS — R634 Abnormal weight loss: Secondary | ICD-10-CM

## 2019-07-19 DIAGNOSIS — Z880 Allergy status to penicillin: Secondary | ICD-10-CM | POA: Insufficient documentation

## 2019-07-19 DIAGNOSIS — D0512 Intraductal carcinoma in situ of left breast: Secondary | ICD-10-CM | POA: Diagnosis not present

## 2019-07-19 DIAGNOSIS — F339 Major depressive disorder, recurrent, unspecified: Secondary | ICD-10-CM | POA: Diagnosis not present

## 2019-07-19 DIAGNOSIS — M25541 Pain in joints of right hand: Secondary | ICD-10-CM | POA: Diagnosis not present

## 2019-07-19 DIAGNOSIS — Z79899 Other long term (current) drug therapy: Secondary | ICD-10-CM

## 2019-07-19 DIAGNOSIS — Z8042 Family history of malignant neoplasm of prostate: Secondary | ICD-10-CM | POA: Insufficient documentation

## 2019-07-19 DIAGNOSIS — Z800 Family history of malignant neoplasm of digestive organs: Secondary | ICD-10-CM | POA: Insufficient documentation

## 2019-07-19 DIAGNOSIS — D649 Anemia, unspecified: Secondary | ICD-10-CM | POA: Insufficient documentation

## 2019-07-19 DIAGNOSIS — Z803 Family history of malignant neoplasm of breast: Secondary | ICD-10-CM | POA: Diagnosis not present

## 2019-07-19 DIAGNOSIS — Z885 Allergy status to narcotic agent status: Secondary | ICD-10-CM | POA: Diagnosis not present

## 2019-07-19 DIAGNOSIS — Z171 Estrogen receptor negative status [ER-]: Secondary | ICD-10-CM

## 2019-07-19 DIAGNOSIS — Z8249 Family history of ischemic heart disease and other diseases of the circulatory system: Secondary | ICD-10-CM

## 2019-07-19 DIAGNOSIS — Z83438 Family history of other disorder of lipoprotein metabolism and other lipidemia: Secondary | ICD-10-CM | POA: Insufficient documentation

## 2019-07-19 LAB — FERRITIN: Ferritin: 75 ng/mL (ref 11–307)

## 2019-07-19 NOTE — Telephone Encounter (Signed)
New Message  Patient verbalized she does not need hand specialist and wants [place] to call the office to cancel her appt.  Patient verbalized she does not remember the name nor does she recall the number of the office.

## 2019-07-19 NOTE — Progress Notes (Signed)
No new changes noted today 

## 2019-07-20 NOTE — Telephone Encounter (Signed)
Patient says she is not going to have a 2nd opinion.  She has cancelled her therapy for now.  She is upset about having to go back and forth to appointments.  She reports going to an office off [address] [phone] and they wanted to just give her medicine  Patient can write again but still has some trouble printing.  Advised patient we would follow up after MRI.

## 2019-07-20 NOTE — Telephone Encounter (Signed)
Dr.Patel patient stated,"wants [place] to cancel appointment, FYI

## 2019-07-20 NOTE — Telephone Encounter (Signed)
Referral was placed to see occupational therapy. Based on our last discussion, she was seeking a 2nd opinion with neurology and therefore, I did not place a referral to see a hand specialist.  Donika K. Posey Pronto, DO

## 2019-07-29 IMAGING — XA DG C-ARM 61-120 MIN-NO REPORT
8 series · 8 of 8 positions shown · non-contrast
Comparison: none

[Series 1: uro standard · 1 of 1 slices shown (1 of 8)]
[im 1/1]
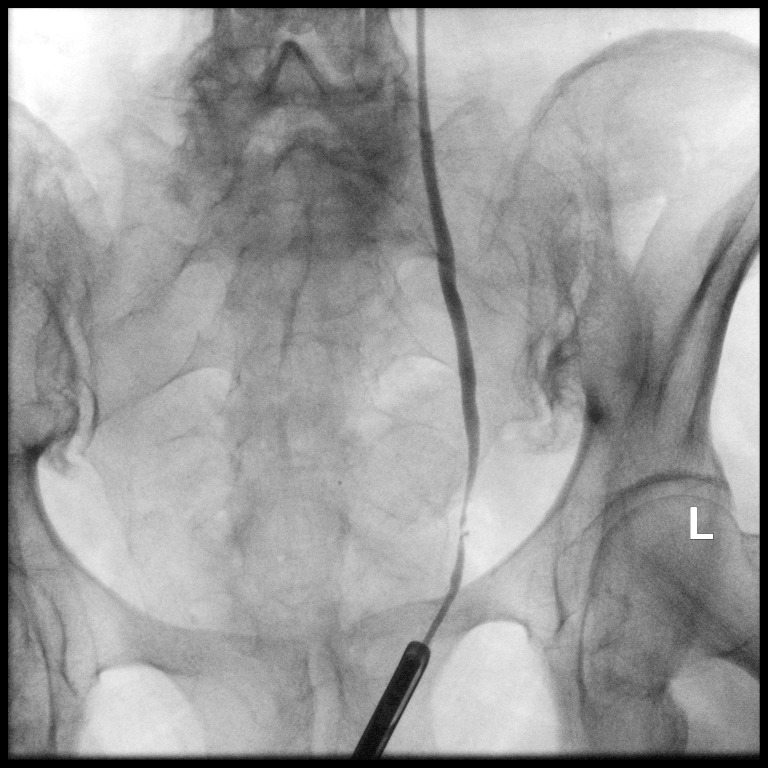

[Series 2: uro standard · 1 of 1 slices shown (2 of 8)]
[im 1/1]
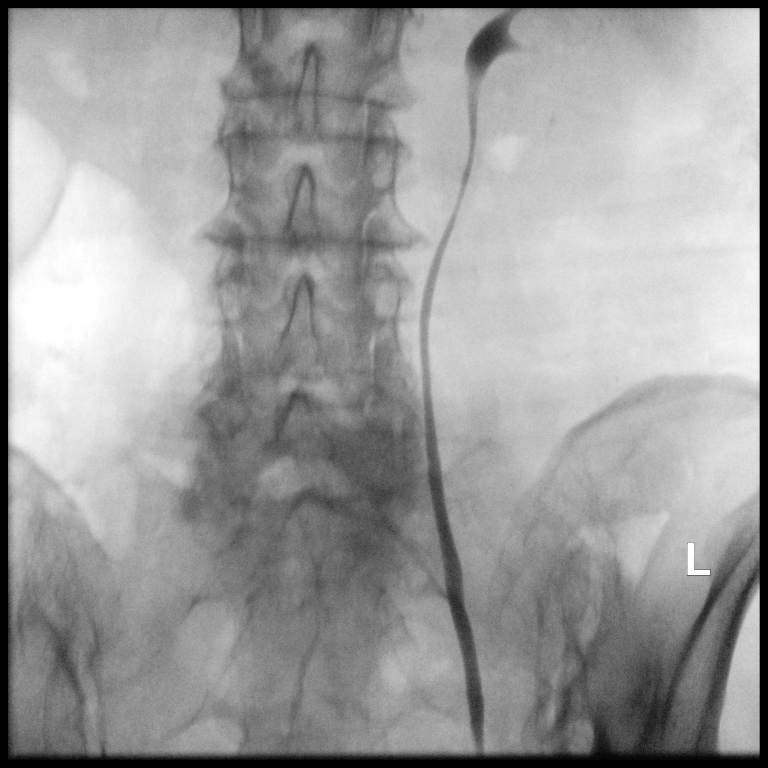

[Series 3: uro standard · 1 of 1 slices shown (3 of 8)]
[im 1/1]
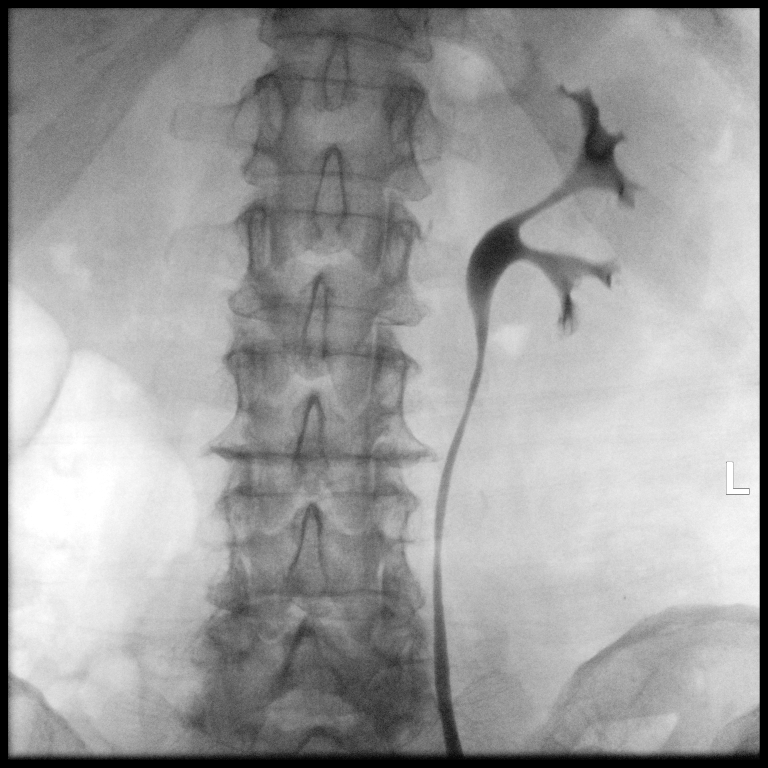

[Series 4: uro standard · 1 of 1 slices shown (4 of 8)]
[im 1/1]
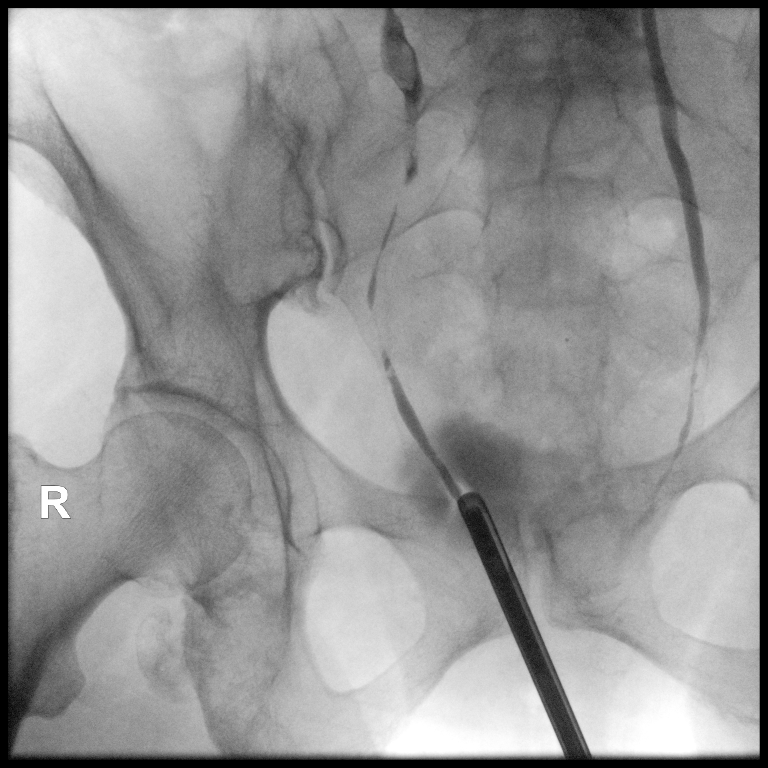

[Series 5: uro standard · 1 of 1 slices shown (5 of 8)]
[im 1/1]
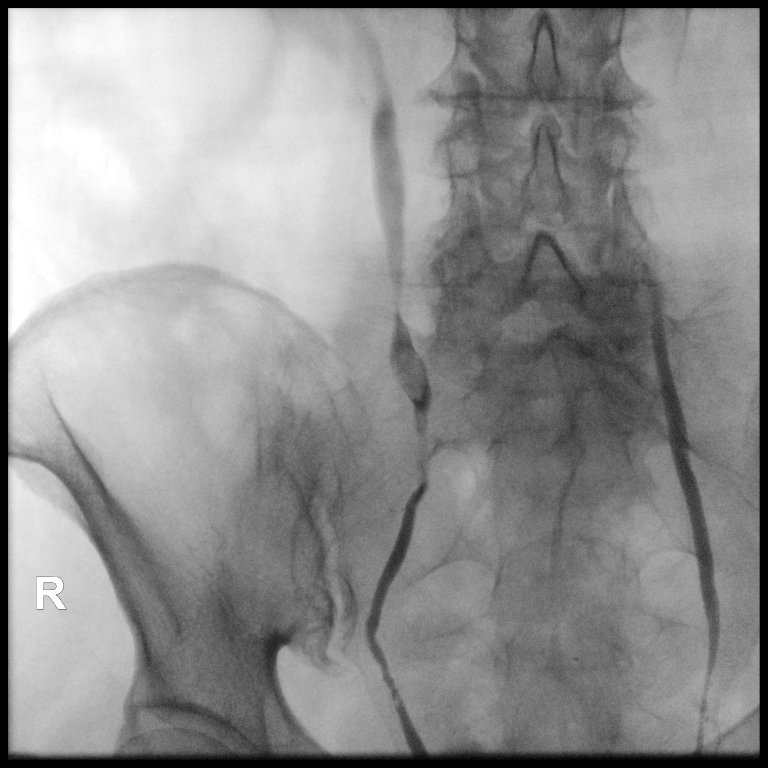

[Series 6: uro standard · 1 of 1 slices shown (6 of 8)]
[im 1/1]
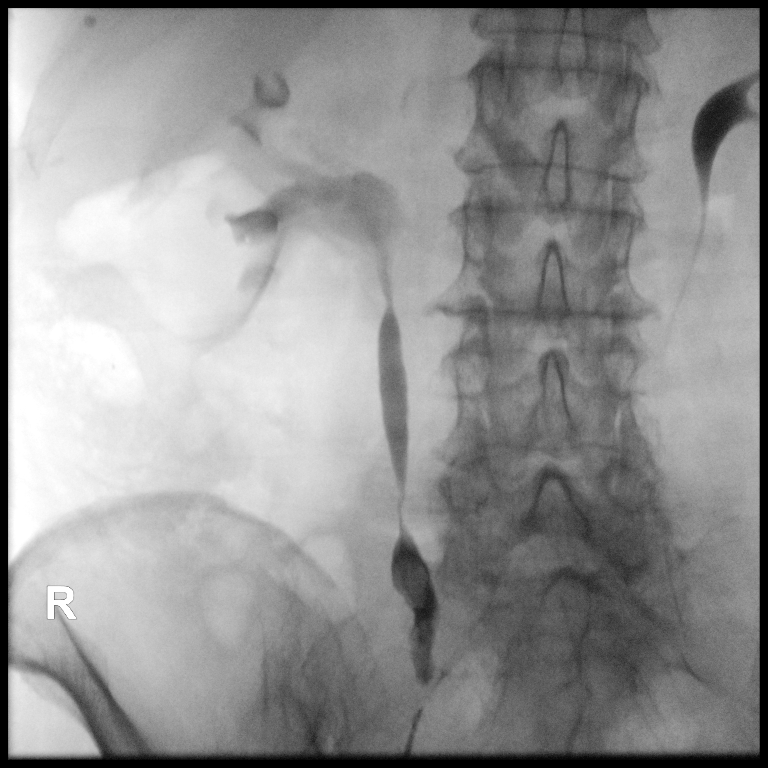

[Series 7: uro standard · 1 of 1 slices shown (7 of 8)]
[im 1/1]
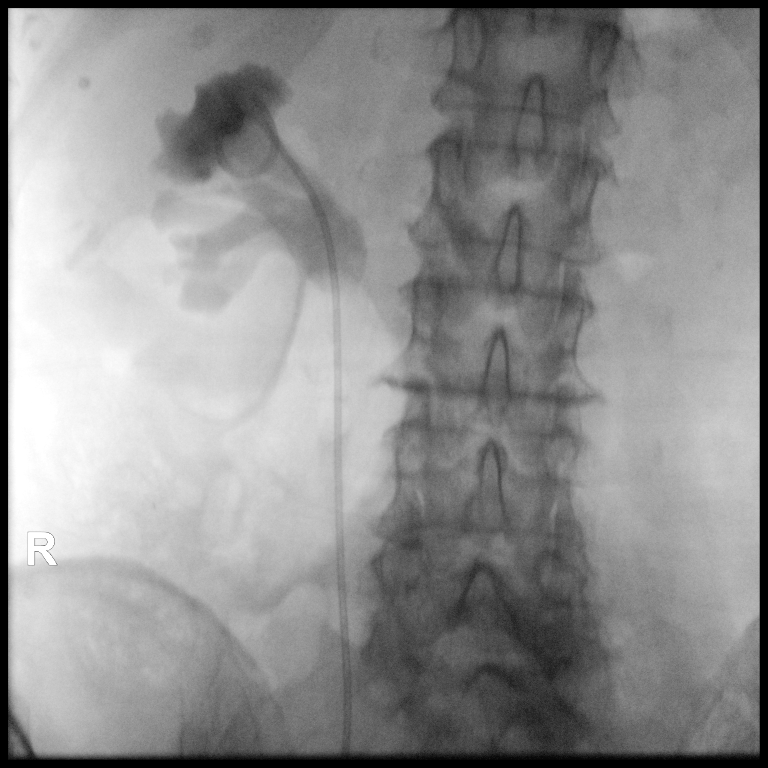

[Series 8: uro standard · 1 of 1 slices shown (8 of 8)]
[im 1/1]
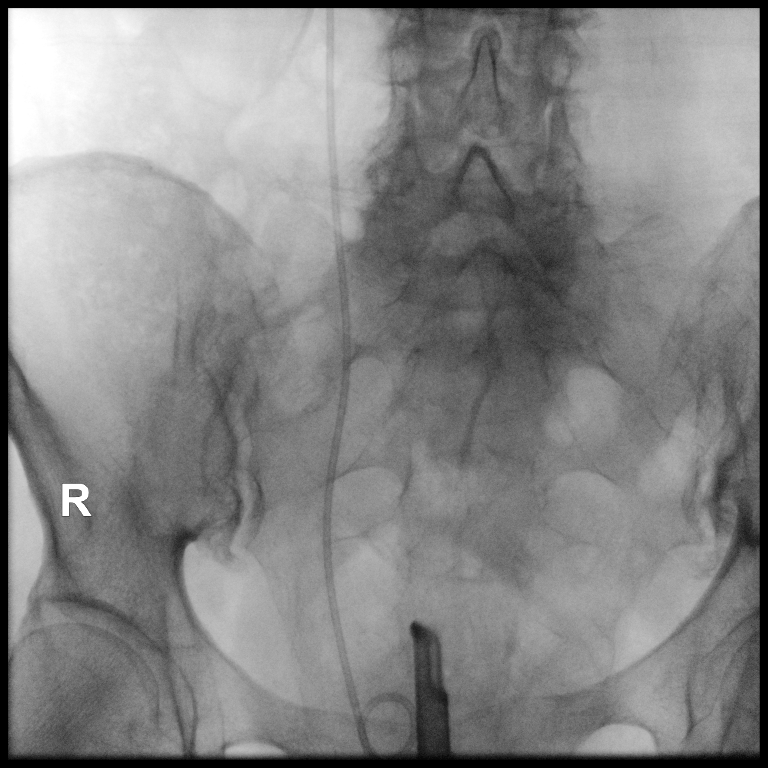

[8 of 8 positions shown; findings below may reference images not displayed]

:
Fluoroscopy was utilized by the requesting physician. No
radiographic interpretation.

## 2019-07-31 ENCOUNTER — Ambulatory Visit
Admission: RE | Admit: 2019-07-31 | Discharge: 2019-07-31 | Disposition: A | Discharge status: Home / Self Care | Payer: PPO | Source: Ambulatory Visit | Attending: Neurology | Admitting: Neurology

## 2019-07-31 ENCOUNTER — Other Ambulatory Visit: Payer: Self-pay

## 2019-07-31 DIAGNOSIS — R292 Abnormal reflex: Secondary | ICD-10-CM

## 2019-07-31 DIAGNOSIS — M4802 Spinal stenosis, cervical region: Secondary | ICD-10-CM | POA: Diagnosis not present

## 2019-07-31 DIAGNOSIS — R29898 Other symptoms and signs involving the musculoskeletal system: Secondary | ICD-10-CM

## 2019-07-31 DIAGNOSIS — G5621 Lesion of ulnar nerve, right upper limb: Secondary | ICD-10-CM

## 2019-08-01 ENCOUNTER — Telehealth: Payer: Self-pay

## 2019-08-01 NOTE — Telephone Encounter (Signed)
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  Continue to see occupational therapy.  Recommend setting up VV to discuss further.  Thanks.

## 2019-08-01 NOTE — Telephone Encounter (Signed)
Patient notified of results, I did schedule VV with Dr. Posey Pronto for discussion

## 2019-08-12 ENCOUNTER — Encounter: Payer: Self-pay | Admitting: Neurology

## 2019-08-12 ENCOUNTER — Other Ambulatory Visit: Payer: Self-pay

## 2019-08-12 ENCOUNTER — Telehealth (INDEPENDENT_AMBULATORY_CARE_PROVIDER_SITE_OTHER): Payer: PPO | Admitting: Neurology

## 2019-08-12 VITALS — Ht 61.0 in | Wt 133.0 lb

## 2019-08-12 DIAGNOSIS — G5621 Lesion of ulnar nerve, right upper limb: Secondary | ICD-10-CM

## 2019-08-12 DIAGNOSIS — M62541 Muscle wasting and atrophy, not elsewhere classified, right hand: Secondary | ICD-10-CM

## 2019-08-12 NOTE — Progress Notes (Signed)
   Virtual Visit via Telephone Note The purpose of this virtual visit is to provide medical care while limiting exposure to the novel coronavirus.    Consent was obtained for telephone visit:  Yes.   Answered questions that patient had about telephone interaction:  Yes.   I discussed the limitations, risks, security and privacy concerns of performing an evaluation and management service by telemedicine. I also discussed with the patient that there may be a patient responsible charge related to this service. The patient expressed understanding and agreed to proceed.  Pt location: Home Physician Location: office Name of referring provider:  Venia Carbon, MD I connected with Adrienne [name] at patients initiation/request on 08/12/2019 at  8:30 AM EDT by video enabled telemedicine application and verified that I am speaking with the correct person using two identifiers. Pt MRN:  [id number] Pt DOB:  [birth year] Video Participants:  Adrienne [name]   History of Present Illness: This is a 78 y.o. female returning for follow-up of right ulnar neuropathy.  She had NCS/EMG in March 2020 which showed severe non-localizable proximal ulnar neuropathy on the right.  She was very reluctant to see a hand specialist at that time. She then had ultrasound of the right ulnar nerve to look for structural pathology, which returned normal (10cm proximal to elbow and down into the wrist).  MRI cervical spine shows multilevel degeneratives changes with only possible nerve impingement at the right C8 nerve root, not enough to explain the severity of her hand weakness and atrophy. She is also complaining of right elbow discomfort now.    At my last telephone visit with her last month, she was seeking a 2nd opinion with another neurologist.  Today, she tells me that she did not follow through with that and would like my opinion.  As previously mentioned, my recommendation is that she see orthopeadic surgery to evaluate  if decompression is an option. She has severe right ulnar neuropathy and she will undoubtedly be left with neurological deficits, the longer she continues to wait.  She is agreeable to proceed with this referral now.  She had many questions which were answered to the best of my ability.   Follow Up Instructions:   I discussed the assessment and treatment plan with the patient. The patient was provided an opportunity to ask questions and all were answered. The patient agreed with the plan and demonstrated an understanding of the instructions.   The patient was advised to call back or seek an in-person evaluation if the symptoms worsen or if the condition fails to improve as anticipated.  Total time spent:  25 minutes     Alda Berthold, DO

## 2019-08-15 ENCOUNTER — Other Ambulatory Visit: Payer: Self-pay

## 2019-08-15 DIAGNOSIS — G5621 Lesion of ulnar nerve, right upper limb: Secondary | ICD-10-CM

## 2019-08-17 ENCOUNTER — Telehealth: Payer: PPO | Admitting: Neurology

## 2019-09-12 ENCOUNTER — Other Ambulatory Visit: Payer: Self-pay

## 2019-09-12 ENCOUNTER — Ambulatory Visit
Admission: RE | Admit: 2019-09-12 | Discharge: 2019-09-12 | Disposition: A | Discharge status: Home / Self Care | Payer: PPO | Source: Ambulatory Visit | Attending: Hematology and Oncology | Admitting: Hematology and Oncology

## 2019-09-12 DIAGNOSIS — D0512 Intraductal carcinoma in situ of left breast: Secondary | ICD-10-CM

## 2019-09-12 DIAGNOSIS — Z1231 Encounter for screening mammogram for malignant neoplasm of breast: Secondary | ICD-10-CM | POA: Diagnosis not present

## 2019-10-24 ENCOUNTER — Encounter: Payer: Self-pay | Admitting: Internal Medicine

## 2019-10-24 ENCOUNTER — Ambulatory Visit (INDEPENDENT_AMBULATORY_CARE_PROVIDER_SITE_OTHER): Payer: PPO | Admitting: Internal Medicine

## 2019-10-24 DIAGNOSIS — R159 Full incontinence of feces: Secondary | ICD-10-CM | POA: Diagnosis not present

## 2019-10-24 DIAGNOSIS — R152 Fecal urgency: Secondary | ICD-10-CM | POA: Diagnosis not present

## 2019-10-24 DIAGNOSIS — F339 Major depressive disorder, recurrent, unspecified: Secondary | ICD-10-CM | POA: Diagnosis not present

## 2019-10-24 NOTE — Assessment & Plan Note (Signed)
No pain, blood, unexplained weight loss to suggest colitis Reassured--this is not a symptom of cancer (her biggest fear) Discussed that this is common--and only had one bad episode when out of the house Asked her to try fiber supplement to see if that helps Would consider GI evaluation if more worrisome symptoms (normal colon in 2013)

## 2019-10-24 NOTE — Assessment & Plan Note (Signed)
Controlled with her medication but very upset about losing control after having rich chocolate cake (affecting her MAOI) Dizziness due to being upset--nothing worrisome noted

## 2019-10-24 NOTE — Progress Notes (Signed)
Subjective:    Patient ID: Adrienne [name], female    DOB: 13-Nov-1941, 78 y.o.   MRN: [id number]  HPI Virtual visit for concerns with loose stools and dizziness Identification done Reviewed billing and she gave consent She is at home and I am in my office  Under a lot of stress So much mail and junk, etc Dr Maurene Capes told her she had a weak sphincter muscle at last colonoscopy Now has rectal urgency and really has to rush (especially in the morning) Will have slight leakage at times Stools are semi-solid---1-2 per day No pain or blood  Gets some dizziness She relates this to stress Still on MAOI for her depression Had chocolate cheesecake and then "busted out with saying things I regretted" Just had spell over the weekend ("she was such a wonderful friend") No syncope No vertigo No chest pain or SOB  Current Outpatient Medications on File Prior to Visit  Medication Sig Dispense Refill  . acetaminophen (TYLENOL) 500 MG tablet Take 1,000 mg by mouth every 6 (six) hours as needed for moderate pain.    Marland Kitchen aspirin 81 MG EC tablet Take 81 mg by mouth daily.      . Calcium Carbonate (CALCIUM 600 PO) Take 600 mg by mouth 2 (two) times daily.      . Cholecalciferol (VITAMIN D3) 1000 units CAPS Take 1,000 Units by mouth daily.    . Loperamide HCl (IMODIUM A-D PO) Take 2 mg by mouth daily.     Marland Kitchen LORazepam (ATIVAN) 0.5 MG tablet TAKE 1 TABLET BY MOUTH EVERYDAY AT BEDTIME    . Melatonin 3 MG TABS Take by mouth.    . Multiple Vitamin (MULTIVITAMIN) tablet Take 1 tablet by mouth daily.    . Multiple Vitamins-Minerals (OCUVITE PO) Take 1 tablet by mouth 2 (two) times daily.     . phenelzine (NARDIL) 15 MG tablet Take 30 mg by mouth 2 (two) times daily.     . [DISCONTINUED] Omega-3-acid Ethyl Esters (LOVAZA PO) Take 2,000 mg by mouth 2 (two) times daily in the am and at bedtime..       No current facility-administered medications on file prior to visit.     Allergies  Allergen Reactions  .  Demerol [Meperidine]     Avoid Demerol due to patient taking MAOI drug.  Clementeen Hoof [Iodinated Diagnostic Agents] Other (See Comments)    CARDIAC ARREST  . Penicillins Anaphylaxis    Has patient had a PCN reaction causing immediate rash, facial/tongue/throat swelling, SOB or lightheadedness with hypotension: Yes Has patient had a PCN reaction causing severe rash involving mucus membranes or skin necrosis: Yes Has patient had a PCN reaction that required hospitalization: No Has patient had a PCN reaction occurring within the last 10 years: Yes If all of the above answers are "NO", then may proceed with Cephalosporin use.   . Metoclopramide Hcl Anxiety and Rash    Past Medical History:  Diagnosis Date  . Anemia, unspecified   . Bipolar affective disorder ([place])   . Breast cancer ([place])    left  . Bronchitis   . Depression   . Depressive disorder, not elsewhere classified   . Disorder of bone and cartilage, unspecified   . External hemorrhoids   . Family history of breast cancer   . Family history of pancreatic cancer   . Family history of pancreatic cancer   . Family history of prostate cancer   . Genital herpes, unspecified   . IBS (  irritable bowel syndrome)   . Lumbago   . Macular degeneration   . Personal history of radiation therapy 2009  . Spinal stenosis, unspecified region other than cervical     Past Surgical History:  Procedure Laterality Date  . ABDOMINAL HYSTERECTOMY     Total but cervix left  . bilateral tuboplasty    . BREAST LUMPECTOMY Left 6/09   Left; high grade carcinoma in situ  . CYSTOSCOPY WITH RETROGRADE PYELOGRAM, URETEROSCOPY AND STENT PLACEMENT Bilateral 10/07/2017   Procedure: RIGHT CYSTOSCOPY WITH BILATERAL RETROGRADE PYELOGRAM, URETEROSCOPY AND STENT PLACEMENT;  Surgeon: Ardis Hughs, MD;  Location: [facility];  Service: Urology;  Laterality: Bilateral;  . DILATION AND CURETTAGE OF UTERUS     x 2  . HOLMIUM LASER APPLICATION Right [id number]    Procedure: HOLMIUM LASER APPLICATION;  Surgeon: Ardis Hughs, MD;  Location: [facility];  Service: Urology;  Laterality: Right;  . MRI lumbar  05/2007   L4-L5 spinal stenosis  . RECTOCELE REPAIR    . TONSILLECTOMY      Family History  Problem Relation Age of Onset  . Pancreatic cancer Father 14  . Prostate cancer Father 51  . Alzheimer's disease Father   . Heart attack Father   . Ulcerative colitis Mother   . Breast cancer Mother 12  . Colitis Mother        ulcerative  . Hypertension Brother   . Hyperlipidemia Brother   . Ulcerative colitis Daughter   . Colitis Daughter        ulcerative  . Breast cancer Paternal Grandmother 43  . Colon cancer Neg Hx     Social History   Socioeconomic History  . Marital status: Divorced    Spouse name: Not on file  . Number of children: 1  . Years of education: Not on file  . Highest education level: Bachelor's degree (e.g., BA, AB, BS)  Occupational History  . 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 Financial resource strain: Not on file  . Food insecurity    Worry: Not on file    Inability: Not on file  . Transportation needs    Medical: Not on file    Non-medical: Not on file  Tobacco Use  . Smoking status: Never Smoker  . Smokeless tobacco: Never Used  Substance and Sexual Activity  . Alcohol use: Yes    Alcohol/week: 10.0 - 14.0 standard drinks    Types: 10 - 14 Glasses of wine per week    Comment: 1 cocktail (manhattan) and 1 glass of wine daily  . Drug use: No  . Sexual activity: Not on file  Lifestyle  . Physical activity    Days per week: Not on file    Minutes per session: Not on file  . Stress: Not on file  Relationships  . Social Herbalist on phone: Not on file    Gets together: Not on file    Attends religious service: Not on file    Active member of club or organization: Not on file    Attends meetings of clubs or organizations: Not on file     Relationship status: Not on file  . Intimate partner violence    Fear of current or ex partner: Not on file    Emotionally abused: Not on file    Physically abused: Not on file    Forced sexual activity: Not on file  Other Topics  Concern  . Not on file  Social History Narrative   Tumultuous preadolescence----mom very sick and she moved around to various relatives      Has living will   Daughter is health care POA-- no alternate (but probably brother)   Would accept resuscitation attempts   Probably wouldn't want tube feedings if cognitively aware   Body donation to [facility] already arranged         Pt lives alone with her dog, she has 1 child, right handed, patient drinks coffee 1 cup in morning, no tea, and some soda.   Review of Systems Appetite is good Has lost about 15#---not eating as much (no longer having the big church meals)     Objective:   Physical Exam  Constitutional: She appears well-developed.  GI:  No abdominal tenderness when she pushes  Psychiatric:  Upset about what happened with her friend           Assessment & Plan:

## 2019-11-02 ENCOUNTER — Telehealth: Payer: Self-pay | Admitting: Internal Medicine

## 2019-11-02 NOTE — Telephone Encounter (Signed)
Abigail Butts, Friend who is helping her with enrolling called today in regards to the patient's insurance, They stated that they are trying to enroll her for [employer] 2021 and Dr Silvio Pate is coming up as a specialist.  Abigail Butts stated that it said he is in network but they are needing the PCP number and wanted to make sure it would cause no problem that it has him as Specialist   Patient gave permission to speak with Abigail Butts is possible   [phone]

## 2019-11-15 NOTE — Telephone Encounter (Signed)
I spoke with the patient to let her know that Dr. Silvio Pate is listed as a PCP for Internal Medicine with [facility].

## 2019-12-12 DIAGNOSIS — F330 Major depressive disorder, recurrent, mild: Secondary | ICD-10-CM | POA: Diagnosis not present

## 2019-12-19 ENCOUNTER — Encounter: Payer: Self-pay | Admitting: Internal Medicine

## 2019-12-19 ENCOUNTER — Ambulatory Visit (INDEPENDENT_AMBULATORY_CARE_PROVIDER_SITE_OTHER): Payer: PPO | Admitting: Internal Medicine

## 2019-12-19 ENCOUNTER — Other Ambulatory Visit: Payer: Self-pay

## 2019-12-19 DIAGNOSIS — R351 Nocturia: Secondary | ICD-10-CM

## 2019-12-19 DIAGNOSIS — G479 Sleep disorder, unspecified: Secondary | ICD-10-CM | POA: Diagnosis not present

## 2019-12-19 MED ORDER — ALPRAZOLAM 0.25 MG PO TABS: 0.2500 mg | ORAL_TABLET | Freq: Two times a day (BID) | ORAL | 0 refills | Status: DC | PRN

## 2019-12-19 NOTE — Assessment & Plan Note (Signed)
Apparent side effect from the melatonin Urinalysis is reassuring

## 2019-12-19 NOTE — Progress Notes (Signed)
Subjective:    Patient ID: Adrienne [name], female    DOB: [birth year], 78 y.o.   MRN: [id number]  HPI Here due to ongoing sleep problems Tried melatonin--seemed to get frequent nocturia on this (and then trouble getting back to sleep) Stopped this--- and the nocturia is better, but she is not able to get to sleep  Went to sleep 7:30PM last night and was up till [place] this mostly due to stress with the holidays Had lorazepam from the past--- helped her sleep but didn't like the morning feeling  "I hate it---being alone again" [employer] is not open--so alone again (had hard time with Thanksgiving) She does visit with family on the phone and rarely on the computer Worried about her daughter---teacher and still in school (and worn out)  Is able to play cards with friends weekly--not really distanced Gets out and walks dog  Current Outpatient Medications on File Prior to Visit  Medication Sig Dispense Refill  . acetaminophen (TYLENOL) 500 MG tablet Take 1,000 mg by mouth every 6 (six) hours as needed for moderate pain.    Marland Kitchen aspirin 81 MG EC tablet Take 81 mg by mouth daily.      . Calcium Carbonate (CALCIUM 600 PO) Take 600 mg by mouth 2 (two) times daily.      . Cholecalciferol (VITAMIN D3) 1000 units CAPS Take 1,000 Units by mouth daily.    . Loperamide HCl (IMODIUM A-D PO) Take 2 mg by mouth daily.     . Multiple Vitamin (MULTIVITAMIN) tablet Take 1 tablet by mouth daily.    . Multiple Vitamins-Minerals (OCUVITE PO) Take 1 tablet by mouth 2 (two) times daily.     . phenelzine (NARDIL) 15 MG tablet Take 30 mg by mouth 2 (two) times daily.     . [DISCONTINUED] Omega-3-acid Ethyl Esters (LOVAZA PO) Take 2,000 mg by mouth 2 (two) times daily in the am and at bedtime..       No current facility-administered medications on file prior to visit.    Allergies  Allergen Reactions  . Demerol [Meperidine]     Avoid Demerol due to patient taking MAOI drug.  Clementeen Hoof [Iodinated  Diagnostic Agents] Other (See Comments)    CARDIAC ARREST  . Penicillins Anaphylaxis    Has patient had a PCN reaction causing immediate rash, facial/tongue/throat swelling, SOB or lightheadedness with hypotension: Yes Has patient had a PCN reaction causing severe rash involving mucus membranes or skin necrosis: Yes Has patient had a PCN reaction that required hospitalization: No Has patient had a PCN reaction occurring within the last 10 years: Yes If all of the above answers are "NO", then may proceed with Cephalosporin use.   . Metoclopramide Hcl Anxiety and Rash    Past Medical History:  Diagnosis Date  . Anemia, unspecified   . Bipolar affective disorder ([place])   . Breast cancer ([place])    left  . Bronchitis   . Depression   . Depressive disorder, not elsewhere classified   . Disorder of bone and cartilage, unspecified   . External hemorrhoids   . Family history of breast cancer   . Family history of pancreatic cancer   . Family history of pancreatic cancer   . Family history of prostate cancer   . Genital herpes, unspecified   . IBS (irritable bowel syndrome)   . Lumbago   . Macular degeneration   . Personal history of radiation therapy 2009  . Spinal stenosis, unspecified region  other than cervical     Past Surgical History:  Procedure Laterality Date  . ABDOMINAL HYSTERECTOMY     Total but cervix left  . bilateral tuboplasty    . BREAST LUMPECTOMY Left 6/09   Left; high grade carcinoma in situ  . CYSTOSCOPY WITH RETROGRADE PYELOGRAM, URETEROSCOPY AND STENT PLACEMENT Bilateral 10/07/2017   Procedure: RIGHT CYSTOSCOPY WITH BILATERAL RETROGRADE PYELOGRAM, URETEROSCOPY AND STENT PLACEMENT;  Surgeon: Ardis Hughs, MD;  Location[facility];  Service: Urology;  Laterality: Bilateral;  . DILATION AND CURETTAGE OF UTERUS     x 2  . HOLMIUM LASER APPLICATION Right [id number]   Procedure: HOLMIUM LASER APPLICATION;  Surgeon: Ardis Hughs, MD;  [facility];   Service: Urology;  Laterality: Right;  . MRI lumbar  05/2007   L4-L5 spinal stenosis  . RECTOCELE REPAIR    . TONSILLECTOMY      Family History  Problem Relation Age of Onset  . Pancreatic cancer Father 4  . Prostate cancer Father 108  . Alzheimer's disease Father   . Heart attack Father   . Ulcerative colitis Mother   . Breast cancer Mother 62  . Colitis Mother        ulcerative  . Hypertension Brother   . Hyperlipidemia Brother   . Ulcerative colitis Daughter   . Colitis Daughter        ulcerative  . Breast cancer Paternal Grandmother 54  . Colon cancer Neg Hx     Social History   Socioeconomic History  . Marital status: Divorced    Spouse name: Not on file  . Number of children: 1  . Years of education: Not on file  . Highest education level: Bachelor's degree (e.g., BA, AB, BS)  Occupational History  . 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 Smoking status: Never Smoker  . Smokeless tobacco: Never Used  Substance and Sexual Activity  . Alcohol use: Yes    Alcohol/week: 10.0 - 14.0 standard drinks    Types: 10 - 14 Glasses of wine per week    Comment: 1 cocktail (manhattan) and 1 glass of wine daily  . Drug use: No  . Sexual activity: Not on file  Other Topics Concern  . Not on file  Social History Narrative   Tumultuous preadolescence----mom very sick and she moved around to various relatives      Has living will   Daughter is health care POA-- no alternate (but probably brother)   Would accept resuscitation attempts   Probably wouldn't want tube feedings if cognitively aware   [facility] donation to [facility] already arranged         Pt lives alone with her dog, she has 1 child, right handed, patient drinks coffee 1 cup in morning, no tea, and some soda.   Social Determinants of Health   Financial Resource Strain:   . Difficulty of Paying Living Expenses: Not on file  Food Insecurity:   . Worried About Sales executive in the Last Year: Not on file  . Ran Out of Food in the Last Year: Not on file  Transportation Needs:   . Lack of Transportation (Medical): Not on file  . Lack of Transportation (Non-Medical): Not on file  Physical Activity:   . Days of Exercise per Week: Not on file  . Minutes of Exercise per Session: Not on file  Stress:   . Feeling of Stress : Not on  file  Social Connections:   . Frequency of Communication with Friends and Family: Not on file  . Frequency of Social Gatherings with Friends and Family: Not on file  . Attends Religious Services: Not on file  . Active Member of Clubs or Organizations: Not on file  . Attends Archivist Meetings: Not on file  . Marital Status: Not on file  Intimate Partner Violence:   . Fear of Current or Ex-Partner: Not on file  . Emotionally Abused: Not on file  . Physically Abused: Not on file  . Sexually Abused: Not on file   Review of Systems  Appetite is okay No fever No hematuria or dysuria Some back pain--feels it is the tension     Objective:   Physical Exam  Constitutional: She appears well-developed. No distress.  Psychiatric:  Normal appearance and speech Slightly tearful  Good insight Judgement appears intact           Assessment & Plan:

## 2019-12-19 NOTE — Assessment & Plan Note (Signed)
Having a hard time alone for the holidays Has some coping mechanisms No clear recurrence of MDD at this point Lorazepam was too much for her--will try xanax for prn She will check in with her psychiatrist

## 2020-02-06 ENCOUNTER — Ambulatory Visit: Payer: Medicare Other | Attending: Internal Medicine

## 2020-02-06 DIAGNOSIS — Z23 Encounter for immunization: Secondary | ICD-10-CM | POA: Insufficient documentation

## 2020-02-06 NOTE — Progress Notes (Signed)
   Covid-19 Vaccination Clinic  Name:  Adrienne [name]    MRN: [id number] DOB: 1941-02-02  02/06/2020  [name] was observed post Covid-19 immunization for 15 minutes without incidence. She was provided with Vaccine Information Sheet and instruction to access the V-Safe system.   [name] was instructed to call 911 with any severe reactions post vaccine: Marland Kitchen Difficulty breathing  . Swelling of your face and throat  . A fast heartbeat  . A bad rash all over your body  . Dizziness and weakness    Immunizations Administered    Name Date Dose VIS Date Route   Pfizer COVID-19 Vaccine 02/06/2020  3:49 PM 0.3 mL 12/09/2019 Intramuscular   Manufacturer: [place]   Lot: [id number]   [place]: [id number]

## 2020-02-29 ENCOUNTER — Ambulatory Visit: Payer: Medicare Other | Attending: Internal Medicine

## 2020-02-29 DIAGNOSIS — Z23 Encounter for immunization: Secondary | ICD-10-CM

## 2020-02-29 NOTE — Progress Notes (Signed)
   Covid-19 Vaccination Clinic  Name:  Adrienne [name]    MRN: [id number] DOB: Aug 18, 1941  02/29/2020  Adrienne [name] was observed post Covid-19 immunization for 15 minutes without incident. She was provided with Vaccine Information Sheet and instruction to access the V-Safe system.   Adrienne [name] was instructed to call 911 with any severe reactions post vaccine: Marland Kitchen Difficulty breathing  . Swelling of face and throat  . A fast heartbeat  . A bad rash all over body  . Dizziness and weakness   Immunizations Administered    Name Date Dose VIS Date Route   Pfizer COVID-19 Vaccine 02/29/2020 12:21 PM 0.3 mL 12/09/2019 Intramuscular   Manufacturer: [place]   Lot: [id number]   [place]: [id number]

## 2020-03-02 ENCOUNTER — Ambulatory Visit: Payer: Medicare Other

## 2020-06-04 DIAGNOSIS — F330 Major depressive disorder, recurrent, mild: Secondary | ICD-10-CM | POA: Diagnosis not present

## 2020-07-16 ENCOUNTER — Encounter: Payer: Self-pay | Admitting: Hematology and Oncology

## 2020-07-16 ENCOUNTER — Other Ambulatory Visit: Payer: Self-pay

## 2020-07-16 NOTE — Progress Notes (Signed)
No new changes noted today. The patient Name and DOB has been verified by phone today. 

## 2020-07-16 NOTE — Progress Notes (Signed)
[facility]  [address], [address] [place], [place] [address] Phone: [phone]  Fax: [phone]   Clinic Day:  07/17/2020  Referring physician: Venia Carbon, MD  Chief Complaint: Adrienne [name] is a 79 y.o. female with with a history of DCIS and a normocytic anemia who is seen for 1 year assessment.  HPI: The patient was last seen in the medical oncology clinic on 07/19/2019. At that time, she was doing well.  Exam was stable.  Bilateral mammogram on 09/12/2019 revealed no mammographic evidence of malignancy.   During the interim, she has been well overall. She denies any major breast concerns, but does note a minor place under her right axilla that almost looks like a zit.   She notes some issues with her right hand, where she is unable to spread her fingers apart as far as she used to; this has prevented her from playing piano like she used to play. 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  This upsets her that she is unable to play like did before.  She is fully vaccinated against COVID-19.   She notes that she has an appointment with her primary care physician next week.    Past Medical History:  Diagnosis Date  . Anemia, unspecified   . Bipolar affective disorder ([place])   . Breast cancer ([place])    left  . Bronchitis   . Depression   . Depressive disorder, not elsewhere classified   . Disorder of bone and cartilage, unspecified   . External hemorrhoids   . Family history of breast cancer   . Family history of pancreatic cancer   . Family history of pancreatic cancer   . Family history of prostate cancer   . Genital herpes, unspecified   . IBS (irritable bowel syndrome)   . Lumbago   . Macular degeneration   . Personal history of radiation therapy 2009  . Spinal stenosis, unspecified region other than cervical     Past Surgical History:  Procedure Laterality Date  . ABDOMINAL HYSTERECTOMY     Total but  cervix left  . bilateral tuboplasty    . BREAST LUMPECTOMY Left 6/09   Left; high grade carcinoma in situ  . CYSTOSCOPY WITH RETROGRADE PYELOGRAM, URETEROSCOPY AND STENT PLACEMENT Bilateral 10/07/2017   Procedure: RIGHT CYSTOSCOPY WITH BILATERAL RETROGRADE PYELOGRAM, URETEROSCOPY AND STENT PLACEMENT;  Surgeon: Ardis Hughs, MD;  Location: [facility];  Service: Urology;  Laterality: Bilateral;  . DILATION AND CURETTAGE OF UTERUS     x 2  . HOLMIUM LASER APPLICATION Right 46/27/0350   Procedure: HOLMIUM LASER APPLICATION;  Surgeon: Ardis Hughs, MD;  Location: [facility];  Service: Urology;  Laterality: Right;  . MRI lumbar  05/2007   L4-L5 spinal stenosis  . RECTOCELE REPAIR    . TONSILLECTOMY      Family History  Problem Relation Age of Onset  . Pancreatic cancer Father 81  . Prostate cancer Father 2  . Alzheimer's disease Father   . Heart attack Father   . Ulcerative colitis Mother   . Breast cancer Mother 9  . Colitis Mother        ulcerative  . Hypertension Brother   . Hyperlipidemia Brother   . Ulcerative colitis Daughter   . Colitis Daughter        ulcerative  . Breast cancer Paternal Grandmother 20  . Colon cancer Neg Hx     Social History:  reports that  she has never smoked. She has never used smokeless tobacco. She reports current alcohol use of about 10.0 - 14.0 standard drinks of alcohol per week. She reports that she does not use drugs. The patient lives in [place] in [place] with her dachshund, Berline Lopes. She states that she is into "everything". 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 She has a life at [place]. She has a daughter named Adrienne [name] and a grandson named Adrienne [name]. She may have a drink before dinner. The patient is alone today.   Allergies:  Allergies  Allergen Reactions  . Demerol [Meperidine]     Avoid Demerol due to patient taking MAOI drug.  Clementeen Hoof [Iodinated Diagnostic Agents]  Other (See Comments)    CARDIAC ARREST  . Penicillins Anaphylaxis    Has patient had a PCN reaction causing immediate rash, facial/tongue/throat swelling, SOB or lightheadedness with hypotension: Yes Has patient had a PCN reaction causing severe rash involving mucus membranes or skin necrosis: Yes Has patient had a PCN reaction that required hospitalization: No Has patient had a PCN reaction occurring within the last 10 years: Yes If all of the above answers are "NO", then may proceed with Cephalosporin use.   . Metoclopramide Hcl Anxiety and Rash    Current Medications: Current Outpatient Medications  Medication Sig Dispense Refill  . acetaminophen (TYLENOL) 500 MG tablet Take 1,000 mg by mouth every 6 (six) hours as needed for moderate pain.    Marland Kitchen ALPRAZolam (XANAX) 0.25 MG tablet Take 1 tablet (0.25 mg total) by mouth 2 (two) times daily as needed for anxiety. 20 tablet 0  . aspirin 81 MG EC tablet Take 81 mg by mouth daily.      . Calcium Carbonate (CALCIUM 600 PO) Take 600 mg by mouth 2 (two) times daily.      . Cholecalciferol (VITAMIN D3) 1000 units CAPS Take 1,000 Units by mouth daily.    Marland Kitchen LORazepam (ATIVAN) 0.5 MG tablet Take 0.5 mg by mouth at bedtime.    Marland Kitchen LORazepam (ATIVAN) 1 MG tablet Take 1 mg by mouth at bedtime.    . Multiple Vitamin (MULTIVITAMIN) tablet Take 1 tablet by mouth daily.    . Multiple Vitamins-Minerals (OCUVITE PO) Take 1 tablet by mouth 2 (two) times daily.     . phenelzine (NARDIL) 15 MG tablet Take 30 mg by mouth 2 (two) times daily.     . Loperamide HCl (IMODIUM A-D PO) Take 2 mg by mouth daily.  (Patient not taking: Reported on 07/16/2020)     No current facility-administered medications for this visit.    Review of Systems  Constitutional: Positive for weight loss (1 lb). Negative for chills, fever and malaise/fatigue.       Doing "fine".  HENT: Negative.  Negative for congestion, ear discharge, ear pain, hearing loss, nosebleeds, sore throat and  tinnitus.   Eyes: Negative.  Negative for blurred vision and double vision.  Respiratory: Negative.  Negative for cough, hemoptysis and shortness of breath.   Cardiovascular: Negative.  Negative for chest pain, palpitations, orthopnea, leg swelling and PND.  Gastrointestinal: Negative.  Negative for abdominal pain, blood in stool, constipation, diarrhea, nausea and vomiting.  Genitourinary: Negative for dysuria, hematuria and urgency.  Musculoskeletal: Positive for joint pain (hands). Negative for back pain, falls, myalgias and neck pain.  Skin: Negative.  Negative for itching and rash.  Neurological: Negative.  Negative for dizziness, tingling, sensory change,  focal weakness and weakness.  Endo/Heme/Allergies: Negative.  Does not bruise/bleed easily.  Psychiatric/Behavioral: Negative.  Negative for depression and memory loss. The patient is not nervous/anxious and does not have insomnia.   All other systems reviewed and are negative.  Performance status (ECOG): 0  Vitals Blood pressure (!) 127/99, pulse 85, temperature (!) 96 F (35.6 C), temperature source Tympanic, resp. rate 18, weight 130 lb 1.1 oz (59 kg), SpO2 97 %.  Physical Exam Vitals and nursing note reviewed.  Constitutional:      General: She is not in acute distress.    Appearance: She is well-developed. She is not diaphoretic.  HENT:     Head: Normocephalic and atraumatic.     Comments: Short gray hair.    Mouth/Throat:     Pharynx: No oropharyngeal exudate.  Eyes:     General: No scleral icterus.    Conjunctiva/sclera: Conjunctivae normal.     Pupils: Pupils are equal, round, and reactive to light.     Comments: Glasses. Brown eyes.  Neck:     Vascular: No JVD.  Cardiovascular:     Rate and Rhythm: Normal rate and regular rhythm.     Heart sounds: Normal heart sounds. No murmur heard.   Pulmonary:     Effort: Pulmonary effort is normal. No respiratory distress.     Breath sounds: Normal breath sounds. No  wheezing or rales.  Chest:     Chest wall: No tenderness.     Breasts:        Right: No mass, nipple discharge or skin change.        Left: No mass, nipple discharge or skin change.  Abdominal:     General: Bowel sounds are normal. There is no distension.     Palpations: Abdomen is soft. There is no mass.     Tenderness: There is no abdominal tenderness. There is no guarding or rebound.  Musculoskeletal:        General: Normal range of motion.     Cervical back: Normal range of motion and neck supple.     Left lower leg: Tenderness (mild, anterior tibia) present.  Lymphadenopathy:     Cervical: No cervical adenopathy.     Upper Body:     Right upper body: No supraclavicular adenopathy.     Left upper body: No supraclavicular adenopathy.  Skin:    General: Skin is warm and dry.     Coloration: Skin is not pale.     Findings: No erythema or rash.  Neurological:     Mental Status: She is alert and oriented to person, place, and time.     Motor: Tremor (slight) present.     Deep Tendon Reflexes: Reflexes are normal and symmetric.  Psychiatric:        Behavior: Behavior normal.        Thought Content: Thought content normal.        Judgment: Judgment normal.    Appointment on 07/17/2020  Component Date Value Ref Range Status  . Sodium 07/17/2020 136  135 - 145 mmol/L Final  . Potassium 07/17/2020 4.5  3.5 - 5.1 mmol/L Final  . Chloride 07/17/2020 101  98 - 111 mmol/L Final  . CO2 07/17/2020 24  22 - 32 mmol/L Final  . Glucose, Bld 07/17/2020 130* 70 - 99 mg/dL Final   Glucose reference range applies only to samples taken after fasting for at least 8 hours.  . BUN 07/17/2020 12  8 - 23 mg/dL Final  .  Creatinine, Ser 07/17/2020 0.72  0.44 - 1.00 mg/dL Final  . Calcium 18/79/9117 9.1  8.9 - 10.3 mg/dL Final  . Total Protein 07/17/2020 7.3  6.5 - 8.1 g/dL Final  . Albumin 99/40/5003 4.3  3.5 - 5.0 g/dL Final  . AST 60/10/2685 29  15 - 41 U/L Final  . ALT 07/17/2020 17  0 - 44  U/L Final  . Alkaline Phosphatase 07/17/2020 72  38 - 126 U/L Final  . Total Bilirubin 07/17/2020 0.5  0.3 - 1.2 mg/dL Final  . GFR calc non Af Amer 07/17/2020 >60  >60 mL/min Final  . GFR calc Af Amer 07/17/2020 >60  >60 mL/min Final  . Anion gap 07/17/2020 11  5 - 15 Final   Performed at [facility], [address] [place], [place] [address]  . WBC 07/17/2020 4.9  4.0 - 10.5 K/uL Final  . RBC 07/17/2020 3.86* 3.87 - 5.11 MIL/uL Final  . Hemoglobin 07/17/2020 11.2* 12.0 - 15.0 g/dL Final  . HCT 48/85/8418 35.4* 36 - 46 % Final  . MCV 07/17/2020 91.7  80.0 - 100.0 fL Final  . MCH 07/17/2020 29.0  26.0 - 34.0 pg Final  . MCHC 07/17/2020 31.6  30.0 - 36.0 g/dL Final  . RDW 67/29/2672 13.2  11.5 - 15.5 % Final  . Platelets 07/17/2020 286  150 - 400 K/uL Final  . nRBC 07/17/2020 0.0  0.0 - 0.2 % Final  . Neutrophils Relative % 07/17/2020 60  % Final  . Neutro Abs 07/17/2020 2.9  1.7 - 7.7 K/uL Final  . Lymphocytes Relative 07/17/2020 27  % Final  . Lymphs Abs 07/17/2020 1.3  0.7 - 4.0 K/uL Final  . Monocytes Relative 07/17/2020 8  % Final  . Monocytes Absolute 07/17/2020 0.4  0 - 1 K/uL Final  . Eosinophils Relative 07/17/2020 4  % Final  . Eosinophils Absolute 07/17/2020 0.2  0 - 0 K/uL Final  . Basophils Relative 07/17/2020 1  % Final  . Basophils Absolute 07/17/2020 0.1  0 - 0 K/uL Final  . Immature Granulocytes 07/17/2020 0  % Final  . Abs Immature Granulocytes 07/17/2020 0.01  0.00 - 0.07 K/uL Final   Performed at [facility], [address] [place], [place] [address]    Assessment:  Annisa Mazzarella is a 79 y.o. 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 Margins were negative. Estrogen receptor was 0% and progesterone receptor 0%.  She received radiation post lumpectomy.  Bilateral screening mammogram on  07/31/2017 revealed no findings suspicious for malignancy.  Bilateral mammogram on 08/02/2018 revealed no evidence on malignancy.   Bilateral mammogram on 09/12/2019 revealed no mammographic evidence of malignancy.   She has a longstanding history of a mild normocytic normochromic anemia. Hemoglobin has ranged between 10.6 - 10.9.  She has no evidence of renal insufficiency. Workup for multiple myeloma was negative. She denies any melena or hematochezia.  Colonoscopy 5-6 years ago was negative.  Diet is good.    Ferritin has been followed: 67 on 05/12/2016, 77 on 05/12/2017, 81 on 06/15/2018, and 75 on 07/18/2019.  She is fully vaccinated against COVID-19.   Symptomatically, she has done well.  She denies any breast concerns.  Exam is unremarkable.  Plan: 1.   Review labs from 07/17/2020. 2.   Left breast DCIS  Clinically, she continues to  do well.  Exam reveals no evidence of recurrent disease.  Bilateral mammogram on 09/12/2019 revealed no evidence of malignancy.  Continue surveillance. 3.   Normocytic anemia  Hematocrit 35.4.  Hemoglobin 11.2.  MCV 91.7.  Ferritin 75 on 07/19/2019.  Patient denies any bleeding.  Patient notes diet is "fine". 4.   Schedule mammogram on 09/12/2020. 5.   RTC in 1 year for MD assessment, labs (CBC with diff, CMP, ferritin, iron studies), and review of interval mammogram.  I discussed the assessment and treatment plan with the patient.  The patient was provided an opportunity to ask questions and all were answered.  The patient agreed with the plan and demonstrated an understanding of the instructions.  The patient was advised to call back if the symptoms worsen or if the condition fails to improve as anticipated.   Lequita Asal, MD, PhD    07/17/2020, 11:45 AM  I, Jacqualyn Posey, am acting as a Education administrator for [employer]. Mike Gip, MD.   I, Nimo Verastegui C. Mike Gip, MD, have reviewed the above documentation for accuracy and completeness, and I agree with the  above.

## 2020-07-17 ENCOUNTER — Inpatient Hospital Stay: Payer: PPO | Attending: Hematology and Oncology | Admitting: Hematology and Oncology

## 2020-07-17 ENCOUNTER — Encounter: Payer: Self-pay | Admitting: Hematology and Oncology

## 2020-07-17 ENCOUNTER — Inpatient Hospital Stay: Payer: PPO

## 2020-07-17 VITALS — BP 127/99 | HR 85 | Temp 96.0°F | Resp 18 | Wt 130.1 lb

## 2020-07-17 DIAGNOSIS — Z803 Family history of malignant neoplasm of breast: Secondary | ICD-10-CM | POA: Diagnosis not present

## 2020-07-17 DIAGNOSIS — Z8349 Family history of other endocrine, nutritional and metabolic diseases: Secondary | ICD-10-CM | POA: Insufficient documentation

## 2020-07-17 DIAGNOSIS — Z8249 Family history of ischemic heart disease and other diseases of the circulatory system: Secondary | ICD-10-CM | POA: Insufficient documentation

## 2020-07-17 DIAGNOSIS — Z8042 Family history of malignant neoplasm of prostate: Secondary | ICD-10-CM | POA: Diagnosis not present

## 2020-07-17 DIAGNOSIS — Z79899 Other long term (current) drug therapy: Secondary | ICD-10-CM | POA: Diagnosis not present

## 2020-07-17 DIAGNOSIS — D0512 Intraductal carcinoma in situ of left breast: Secondary | ICD-10-CM

## 2020-07-17 DIAGNOSIS — Z7982 Long term (current) use of aspirin: Secondary | ICD-10-CM | POA: Diagnosis not present

## 2020-07-17 DIAGNOSIS — D649 Anemia, unspecified: Secondary | ICD-10-CM

## 2020-07-17 DIAGNOSIS — F319 Bipolar disorder, unspecified: Secondary | ICD-10-CM | POA: Diagnosis not present

## 2020-07-17 DIAGNOSIS — Z800 Family history of malignant neoplasm of digestive organs: Secondary | ICD-10-CM | POA: Insufficient documentation

## 2020-07-17 LAB — COMPREHENSIVE METABOLIC PANEL
ALT: 17 U/L (ref 0–44)
AST: 29 U/L (ref 15–41)
Albumin: 4.3 g/dL (ref 3.5–5.0)
Alkaline Phosphatase: 72 U/L (ref 38–126)
Anion gap: 11 (ref 5–15)
BUN: 12 mg/dL (ref 8–23)
CO2: 24 mmol/L (ref 22–32)
Calcium: 9.1 mg/dL (ref 8.9–10.3)
Chloride: 101 mmol/L (ref 98–111)
Creatinine, Ser: 0.72 mg/dL (ref 0.44–1.00)
GFR calc Af Amer: >60 mL/min (ref >60)
GFR calc non Af Amer: >60 mL/min (ref >60)
Glucose, Bld: 130 mg/dL — ABNORMAL HIGH (ref 70–99)
Potassium: 4.5 mmol/L (ref 3.5–5.1)
Sodium: 136 mmol/L (ref 135–145)
Total Bilirubin: 0.5 mg/dL (ref 0.3–1.2)
Total Protein: 7.3 g/dL (ref 6.5–8.1)

## 2020-07-17 LAB — CBC WITH DIFFERENTIAL/PLATELET
Abs Immature Granulocytes: 0.01 10*3/uL (ref 0.00–0.07)
Basophils Absolute: 0.1 10*3/uL (ref 0.0–0.1)
Basophils Relative: 1 %
Eosinophils Absolute: 0.2 10*3/uL (ref 0.0–0.5)
Eosinophils Relative: 4 %
HCT: 35.4 % — ABNORMAL LOW (ref 36.0–46.0)
Hemoglobin: 11.2 g/dL — ABNORMAL LOW (ref 12.0–15.0)
Immature Granulocytes: 0 %
Lymphocytes Relative: 27 %
Lymphs Abs: 1.3 10*3/uL (ref 0.7–4.0)
MCH: 29 pg (ref 26.0–34.0)
MCHC: 31.6 g/dL (ref 30.0–36.0)
MCV: 91.7 fL (ref 80.0–100.0)
Monocytes Absolute: 0.4 10*3/uL (ref 0.1–1.0)
Monocytes Relative: 8 %
Neutro Abs: 2.9 10*3/uL (ref 1.7–7.7)
Neutrophils Relative %: 60 %
Platelets: 286 10*3/uL (ref 150–400)
RBC: 3.86 MIL/uL — ABNORMAL LOW (ref 3.87–5.11)
RDW: 13.2 % (ref 11.5–15.5)
WBC: 4.9 10*3/uL (ref 4.0–10.5)
nRBC: 0 % (ref 0.0–0.2)

## 2020-07-18 ENCOUNTER — Telehealth: Payer: Self-pay | Admitting: Hematology and Oncology

## 2020-07-18 NOTE — Telephone Encounter (Signed)
07/18/2020 Attempted to call pt to inform of date for annual mammogram on 09/13/20 but was unable to leave a voicemail. Appts have been printed and sent in the mail. SRW

## 2020-07-23 ENCOUNTER — Encounter: Payer: Self-pay | Admitting: Internal Medicine

## 2020-07-23 ENCOUNTER — Ambulatory Visit (INDEPENDENT_AMBULATORY_CARE_PROVIDER_SITE_OTHER): Payer: PPO | Admitting: Internal Medicine

## 2020-07-23 ENCOUNTER — Other Ambulatory Visit: Payer: Self-pay

## 2020-07-23 VITALS — BP 140/78 | HR 77 | Temp 98.0°F | Ht 60.0 in | Wt 129.0 lb

## 2020-07-23 DIAGNOSIS — Z7189 Other specified counseling: Secondary | ICD-10-CM

## 2020-07-23 DIAGNOSIS — I700 Atherosclerosis of aorta: Secondary | ICD-10-CM | POA: Diagnosis not present

## 2020-07-23 DIAGNOSIS — Z0000 Encounter for general adult medical examination without abnormal findings: Secondary | ICD-10-CM | POA: Diagnosis not present

## 2020-07-23 DIAGNOSIS — G629 Polyneuropathy, unspecified: Secondary | ICD-10-CM | POA: Diagnosis not present

## 2020-07-23 DIAGNOSIS — F339 Major depressive disorder, recurrent, unspecified: Secondary | ICD-10-CM

## 2020-07-23 NOTE — Assessment & Plan Note (Signed)
Okay on nardil Uses the lorazepam for sleep prn

## 2020-07-23 NOTE — Assessment & Plan Note (Signed)
I have personally reviewed the Medicare Annual Wellness questionnaire and have noted 1. The patient's medical and social history 2. Their use of alcohol, tobacco or illicit drugs 3. Their current medications and supplements 4. The patient's functional ability including ADL's, fall risks, home safety risks and hearing or visual             impairment. 5. Diet and physical activities 6. Evidence for depression or mood disorders  The patients weight, height, BMI and visual acuity have been recorded in the chart I have made referrals, counseling and provided education to the patient based review of the above and I have provided the pt with a written personalized care plan for preventive services.  I have provided you with a copy of your personalized plan for preventive services. Please take the time to review along with your updated medication list.  Flu vaccine in the fall She will go to get the second shingrix Yearly mammogram Consider one last colonoscopy in 1-2 years

## 2020-07-23 NOTE — Assessment & Plan Note (Signed)
Found on CT scan some years ago Will just continue the ASA daily

## 2020-07-23 NOTE — Progress Notes (Signed)
Hearing Screening   Method: Audiometry   125Hz  250Hz  500Hz  1000Hz  2000Hz  3000Hz  4000Hz  6000Hz  8000Hz   Right ear:   20 25 20  25     Left ear:   20 20 20  20       Visual Acuity Screening   Right eye Left eye Both eyes  Without correction:     With correction: 20/40 20/30 20/30

## 2020-07-23 NOTE — Assessment & Plan Note (Signed)
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

## 2020-07-23 NOTE — Progress Notes (Signed)
Subjective:    Patient ID: Adrienne [name], female    DOB: 10-24-1941, 79 y.o.   MRN: [id number]  HPI Here for Medicare wellness visit and follow up of chronic health conditions This visit occurred during the SARS-CoV-2 public health emergency.  Safety protocols were in place, including screening questions prior to the visit, additional usage of staff PPE, and extensive cleaning of exam room while observing appropriate contact time as indicated for disinfecting solutions.   Reviewed form and advanced directives Reviewed other doctors Has [place] daily before dinner No tobacco Walks every day No falls Chronic depression Independent with instrumental ADLs No memory issues  Upset because best friend, a retired Programme researcher, broadcasting/film/video told her she thought she has Parkinson's Still with right hand problems No gait problems Normal handwriting Electric shock feeling in 4th-5th fingers at time Decreased use of intrinsic muscles  Continues with Dr Casimiro Needle Depression under control---until friend told her about the Parkinson's thing Considering moving back up [place] to [place] (daughter lives [place])  Reviewed past CT scan for stone Has aortic atherosclerosis Is on ASA No chest pain or SOB  Follows with oncologist [facility] Chronic stable anemia as well  Current Outpatient Medications on File Prior to Visit  Medication Sig Dispense Refill  . acetaminophen (TYLENOL) 500 MG tablet Take 1,000 mg by mouth every 6 (six) hours as needed for moderate pain.    Marland Kitchen aspirin 81 MG EC tablet Take 81 mg by mouth daily.      . Calcium Carbonate (CALCIUM 600 PO) Take 600 mg by mouth 2 (two) times daily.      . Cholecalciferol (VITAMIN D3) 1000 units CAPS Take 1,000 Units by mouth daily.    . Loperamide HCl (IMODIUM A-D PO) Take 2 mg by mouth daily.     . Multiple Vitamin (MULTIVITAMIN) tablet Take 1 tablet by mouth daily.    . Multiple Vitamins-Minerals (OCUVITE PO) Take 1 tablet by mouth 2 (two) times  daily.     . phenelzine (NARDIL) 15 MG tablet Take 30 mg by mouth 2 (two) times daily.     Marland Kitchen LORazepam (ATIVAN) 0.5 MG tablet Take 0.5 mg by mouth at bedtime.    Marland Kitchen LORazepam (ATIVAN) 1 MG tablet Take 1 mg by mouth at bedtime.    . [DISCONTINUED] Omega-3-acid Ethyl Esters (LOVAZA PO) Take 2,000 mg by mouth 2 (two) times daily in the am and at bedtime..       No current facility-administered medications on file prior to visit.    Allergies  Allergen Reactions  . Demerol [Meperidine]     Avoid Demerol due to patient taking MAOI drug.  Clementeen Hoof [Iodinated Diagnostic Agents] Other (See Comments)    CARDIAC ARREST  . Penicillins Anaphylaxis    Has patient had a PCN reaction causing immediate rash, facial/tongue/throat swelling, SOB or lightheadedness with hypotension: Yes Has patient had a PCN reaction causing severe rash involving mucus membranes or skin necrosis: Yes Has patient had a PCN reaction that required hospitalization: No Has patient had a PCN reaction occurring within the last 10 years: Yes If all of the above answers are "NO", then may proceed with Cephalosporin use.   . Metoclopramide Hcl Anxiety and Rash    Past Medical History:  Diagnosis Date  . Anemia, unspecified   . Bipolar affective disorder ([place])   . Breast cancer ([place])    left  . Bronchitis   . Depression   . Depressive disorder, not elsewhere classified   .  Disorder of bone and cartilage, unspecified   . External hemorrhoids   . Family history of breast cancer   . Family history of pancreatic cancer   . Family history of pancreatic cancer   . Family history of prostate cancer   . Genital herpes, unspecified   . IBS (irritable bowel syndrome)   . Lumbago   . Macular degeneration   . Personal history of radiation therapy 2009  . Spinal stenosis, unspecified region other than cervical     Past Surgical History:  Procedure Laterality Date  . ABDOMINAL HYSTERECTOMY     Total but cervix left  .  bilateral tuboplasty    . BREAST LUMPECTOMY Left 6/09   Left; high grade carcinoma in situ  . CYSTOSCOPY WITH RETROGRADE PYELOGRAM, URETEROSCOPY AND STENT PLACEMENT Bilateral 10/07/2017   Procedure: RIGHT CYSTOSCOPY WITH BILATERAL RETROGRADE PYELOGRAM, URETEROSCOPY AND STENT PLACEMENT;  Surgeon: Ardis Hughs, MD;  Location: [facility];  Service: Urology;  Laterality: Bilateral;  . DILATION AND CURETTAGE OF UTERUS     x 2  . HOLMIUM LASER APPLICATION Right 94/85/4627   Procedure: HOLMIUM LASER APPLICATION;  Surgeon: Ardis Hughs, MD;  Location: [facility];  Service: Urology;  Laterality: Right;  . MRI lumbar  05/2007   L4-L5 spinal stenosis  . RECTOCELE REPAIR    . TONSILLECTOMY      Family History  Problem Relation Age of Onset  . Pancreatic cancer Father 86  . Prostate cancer Father 46  . Alzheimer's disease Father   . Heart attack Father   . Ulcerative colitis Mother   . Breast cancer Mother 94  . Colitis Mother        ulcerative  . Hypertension Brother   . Hyperlipidemia Brother   . Ulcerative colitis Daughter   . Colitis Daughter        ulcerative  . Breast cancer Paternal Grandmother 36  . Colon cancer Neg Hx     Social History   Socioeconomic History  . Marital status: Divorced    Spouse name: Not on file  . Number of children: 1  . Years of education: Not on file  . Highest education level: Bachelor's degree (e.g., BA, AB, BS)  Occupational History  . 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 Smoking status: Never Smoker  . Smokeless tobacco: Never Used  Vaping Use  . Vaping Use: Never used  Substance and Sexual Activity  . Alcohol use: Yes    Alcohol/week: 10.0 - 14.0 standard drinks    Types: 10 - 14 Glasses of wine per week    Comment: 1 cocktail ([place]) and 1 glass of wine daily  . Drug use: No  . Sexual activity: Not on file  Other Topics Concern  . Not on file  Social History Narrative   Tumultuous  preadolescence----mom very sick and she moved around to various relatives      Has living will   Daughter is health care POA-- no alternate (but probably brother)   Would accept resuscitation attempts   Probably wouldn't want tube feedings if cognitively aware   [facility] donation to [facility] already arranged         Pt lives alone with her dog, she has 1 child, right handed, patient drinks coffee 1 cup in morning, no tea, and some soda.   Social Determinants of Health   Financial Resource Strain:   . Difficulty of Paying Living Expenses:   Food  Insecurity:   . Worried About Charity fundraiser in the Last Year:   . Arboriculturist in the Last Year:   Transportation Needs:   . Film/video editor (Medical):   Marland Kitchen Lack of Transportation (Non-Medical):   Physical Activity:   . Days of Exercise per Week:   . Minutes of Exercise per Session:   Stress:   . Feeling of Stress :   Social Connections:   . Frequency of Communication with Friends and Family:   . Frequency of Social Gatherings with Friends and Family:   . Attends Religious Services:   . Active Member of Clubs or Organizations:   . Attends Archivist Meetings:   Marland Kitchen Marital Status:   Intimate Partner Violence:   . Fear of Current or Ex-Partner:   . Emotionally Abused:   Marland Kitchen Physically Abused:   . Sexually Abused:    Review of Systems Some trouble sleeping--"I think about things" Appetite is fine Weight stable Wears seat belt Does get occasional right hip pain---just in AM (stiffness) No heartburn or dysphagia Bowels are okay---briefly loose (not better now) No urinary incontinence No skin problems Teeth okay--regular with dentist    Objective:   Physical Exam Constitutional:      Appearance: Normal appearance.  HENT:     Head: Normocephalic and atraumatic.     Mouth/Throat:     Mouth: Mucous membranes are moist.     Comments: No lesions Cardiovascular:     Rate and Rhythm: Normal rate and regular  rhythm.     Pulses: Normal pulses.     Heart sounds: No murmur heard.  No gallop.   Pulmonary:     Effort: Pulmonary effort is normal.     Breath sounds: Normal breath sounds. No wheezing or rales.  Abdominal:     Palpations: Abdomen is soft.     Tenderness: There is no abdominal tenderness.  Musculoskeletal:     Cervical back: Neck supple.     Right lower leg: No edema.     Left lower leg: No edema.  Lymphadenopathy:     Cervical: No cervical adenopathy.  Skin:    General: Skin is warm.     Findings: No rash.  Neurological:     Mental Status: She is alert and oriented to person, place, and time.     Comments: President--- "Zoila Shutter, [place]---- ?" [phone] [id number] Recall 2/3  Mild tremor with movement in right hand  Psychiatric:        Behavior: Behavior normal.     Comments: Mild anxiety            Assessment & Plan:

## 2020-07-23 NOTE — Assessment & Plan Note (Signed)
See social history 

## 2020-09-13 ENCOUNTER — Ambulatory Visit
Admission: RE | Admit: 2020-09-13 | Discharge: 2020-09-13 | Disposition: A | Discharge status: Home / Self Care | Payer: PPO | Source: Ambulatory Visit | Attending: Hematology and Oncology | Admitting: Hematology and Oncology

## 2020-09-13 ENCOUNTER — Other Ambulatory Visit: Payer: Self-pay

## 2020-09-13 DIAGNOSIS — D0512 Intraductal carcinoma in situ of left breast: Secondary | ICD-10-CM

## 2020-09-13 DIAGNOSIS — Z1231 Encounter for screening mammogram for malignant neoplasm of breast: Secondary | ICD-10-CM | POA: Insufficient documentation

## 2020-09-17 ENCOUNTER — Other Ambulatory Visit: Payer: Self-pay | Admitting: Hematology and Oncology

## 2020-09-17 DIAGNOSIS — N6489 Other specified disorders of breast: Secondary | ICD-10-CM

## 2020-09-17 DIAGNOSIS — R928 Other abnormal and inconclusive findings on diagnostic imaging of breast: Secondary | ICD-10-CM

## 2020-09-25 ENCOUNTER — Other Ambulatory Visit: Payer: Self-pay

## 2020-09-25 ENCOUNTER — Ambulatory Visit
Admission: RE | Admit: 2020-09-25 | Discharge: 2020-09-25 | Disposition: A | Discharge status: Home / Self Care | Payer: PPO | Source: Ambulatory Visit | Attending: Hematology and Oncology | Admitting: Hematology and Oncology

## 2020-09-25 DIAGNOSIS — R922 Inconclusive mammogram: Secondary | ICD-10-CM | POA: Diagnosis not present

## 2020-09-25 DIAGNOSIS — N6489 Other specified disorders of breast: Secondary | ICD-10-CM | POA: Insufficient documentation

## 2020-09-25 DIAGNOSIS — R928 Other abnormal and inconclusive findings on diagnostic imaging of breast: Secondary | ICD-10-CM | POA: Diagnosis not present

## 2020-11-10 ENCOUNTER — Ambulatory Visit: Payer: PPO

## 2020-12-05 DIAGNOSIS — F330 Major depressive disorder, recurrent, mild: Secondary | ICD-10-CM | POA: Diagnosis not present

## 2020-12-26 NOTE — Progress Notes (Signed)
Adrienne Kingston T. Adrienne Mesta, MD, CAQ Sports Medicine  Primary Care and Sports Medicine [facility] at [facility] [address] [place], [address]  Phone: [phone]  FAX: [phone]  Adrienne [name] - 79 y.o. female  MRN [id number]  Date of Birth: 08-05-1941  Date: 12/27/2020  PCP: Karie Schwalbe, MD  Referral: Karie Schwalbe, MD  Chief Complaint  Patient presents with  . Sore    On Left side of face  . Ear Drainage    Ears are crusty    This visit occurred during the SARS-CoV-2 public health emergency.  Safety protocols were in place, including screening questions prior to the visit, additional usage of staff PPE, and extensive cleaning of exam room while observing appropriate contact time as indicated for disinfecting solutions.   Subjective:   Adrienne [name] is a 79 y.o. very pleasant female patient with Body mass index is 25.78 kg/m. who presents with the following:  She is a pleasant young lady and she presents with "a pimple and a scab."  On further questioning the patient has had this lesion on the left side of her face for months.  It will bleed sometimes and then will sometimes be itchy and she scratches it and then will go through various stages of healing.  Currently it is about the size of a quarter.  She has no known exposures at all that she can think of.  She also does have some crusting in her ear canal.  This is bothersome and it is itchy.  L side of   Immunization History  Administered Date(s) Administered  . Influenza Whole 11/08/2010  . Influenza, High Dose Seasonal PF 08/26/2019  . Influenza, Seasonal, Injecte, Preservative Fre 08/24/2016  . Influenza,inj,Quad PF,6+ Mos 09/28/2014, 09/10/2015  . Influenza-Unspecified 10/29/2017, 09/28/2018  . PFIZER SARS-COV-2 Vaccination 02/06/2020, 02/29/2020  . Pneumococcal Conjugate-13 09/10/2015  . Pneumococcal Polysaccharide-23 10/12/2009, 11/30/2017  . Td 10/12/2009  . Zoster  10/19/2009  . Zoster Recombinat (Shingrix) 07/26/2019     Review of Systems is noted in the HPI, as appropriate  Objective:   BP 120/60   Pulse 87   Temp 98.5 F (36.9 C) (Temporal)   Ht 5' (1.524 m)   Wt 132 lb (59.9 kg)   SpO2 97%   BMI 25.78 kg/m   GEN: No acute distress; alert,appropriate. PULM: Breathing comfortably in no respiratory distress PSYCH: Normally interactive.     Ear canals are itchy without being notably swollen.  TMs are clear.  Laboratory and Imaging Data:  Assessment and Plan:     ICD-10-CM   1. Facial lesion  L98.9 Ambulatory referral to Dermatology  2. Ear itch  L29.9    I talked about this case with one of my partners, and additionally I think that the acute ear itching is also acute on chronic  I am worried that this lesion on her face on the left is a skin cancer.  I am going to have dermatology evaluate this.  In regards to her ear itching I think that she could use some Cortaid 10 along with the emulsifier like Vaseline or Cetaphil and this would make a significant difference.  Patient Instructions  Hydrocortisone 1% over the counter, use a Q-tip to apply in the ear canal and around it.  You can use some vaseline or other more dense moisturizer like Cetaphil once it calms down some    Orders Placed This Encounter  Procedures  . Ambulatory referral to Dermatology  Follow-up: No follow-ups on file.  Signed,  Elpidio Galea. Cyndel Griffey, MD   Outpatient Encounter Medications as of 12/27/2020  Medication Sig  . acetaminophen (TYLENOL) 500 MG tablet Take 1,000 mg by mouth every 6 (six) hours as needed for moderate pain.  Marland Kitchen aspirin 81 MG EC tablet Take 81 mg by mouth daily.  . Calcium Carbonate (CALCIUM 600 PO) Take 600 mg by mouth 2 (two) times daily.  . Cholecalciferol (VITAMIN D3) 1000 units CAPS Take 1,000 Units by mouth daily.  . Loperamide HCl (IMODIUM A-D PO) Take 2 mg by mouth daily.   Marland Kitchen LORazepam (ATIVAN) 0.5 MG tablet Take 0.5  mg by mouth at bedtime.  Marland Kitchen LORazepam (ATIVAN) 1 MG tablet Take 1 mg by mouth at bedtime.  . Multiple Vitamin (MULTIVITAMIN) tablet Take 1 tablet by mouth daily.  . Multiple Vitamins-Minerals (OCUVITE PO) Take 1 tablet by mouth 2 (two) times daily.  . phenelzine (NARDIL) 15 MG tablet Take 30 mg by mouth 2 (two) times daily.  . [DISCONTINUED] Omega-3-acid Ethyl Esters (LOVAZA PO) Take 2,000 mg by mouth 2 (two) times daily in the am and at bedtime..     No facility-administered encounter medications on file as of 12/27/2020.

## 2020-12-27 ENCOUNTER — Encounter: Payer: Self-pay | Admitting: Family Medicine

## 2020-12-27 ENCOUNTER — Ambulatory Visit (INDEPENDENT_AMBULATORY_CARE_PROVIDER_SITE_OTHER): Payer: PPO | Admitting: Family Medicine

## 2020-12-27 ENCOUNTER — Other Ambulatory Visit: Payer: Self-pay

## 2020-12-27 VITALS — BP 120/60 | HR 87 | Temp 98.5°F | Ht 60.0 in | Wt 132.0 lb

## 2020-12-27 DIAGNOSIS — L989 Disorder of the skin and subcutaneous tissue, unspecified: Secondary | ICD-10-CM

## 2020-12-27 DIAGNOSIS — L299 Pruritus, unspecified: Secondary | ICD-10-CM | POA: Diagnosis not present

## 2020-12-27 NOTE — Patient Instructions (Signed)
Hydrocortisone 1% over the counter, use a Q-tip to apply in the ear canal and around it.  You can use some vaseline or other more dense moisturizer like Cetaphil once it calms down some

## 2021-01-23 ENCOUNTER — Telehealth: Payer: Self-pay

## 2021-01-23 NOTE — Telephone Encounter (Signed)
Pt said recently started with urinary incontinence when sleeping. Pt request med to help stop this incontinence. Pt scheduled in office appt on 01/25/21 at 10:15 with Dr Silvio Pate. UC precautions given and pt voiced understanding. No covid symptoms or known exposure.

## 2021-01-23 NOTE — Telephone Encounter (Signed)
Yes---I will need to check urine and discuss pros and cons of Rx

## 2021-01-25 ENCOUNTER — Encounter: Payer: Self-pay | Admitting: Internal Medicine

## 2021-01-25 ENCOUNTER — Other Ambulatory Visit: Payer: Self-pay

## 2021-01-25 ENCOUNTER — Ambulatory Visit (INDEPENDENT_AMBULATORY_CARE_PROVIDER_SITE_OTHER): Payer: PPO | Admitting: Internal Medicine

## 2021-01-25 DIAGNOSIS — R3915 Urgency of urination: Secondary | ICD-10-CM | POA: Insufficient documentation

## 2021-01-25 LAB — POC URINALSYSI DIPSTICK (AUTOMATED)
Bilirubin, UA: NEGATIVE
Blood, UA: NEGATIVE
Glucose, UA: NEGATIVE
Ketones, UA: NEGATIVE
Leukocytes, UA: NEGATIVE
Nitrite, UA: NEGATIVE
Protein, UA: POSITIVE — AB
Spec Grav, UA: 1.015 (ref 1.010–1.025)
Urobilinogen, UA: 0.2 E.U./dL
pH, UA: 7 (ref 5.0–8.0)

## 2021-01-25 NOTE — Assessment & Plan Note (Addendum)
Only at night Urinalysis is normal (except slight protein) She has made some behavioral changes and is doing better Less fluids, making herself get up with first urge (and she gets back to sleep without any problems). She does wear a pad Would have considered imipramine (or nortriptyline)---but better to avoid medicaitons

## 2021-01-25 NOTE — Addendum Note (Signed)
Addended by: Pilar Grammes on: 01/25/2021 10:54 AM   Modules accepted: Orders

## 2021-01-25 NOTE — Progress Notes (Signed)
Subjective:    Patient ID: Adrienne [name], female    DOB: Feb 05, 1941, 80 y.o.   MRN: [id number]  HPI Here due to nighttime urinary urgency This visit occurred during the SARS-CoV-2 public health emergency.  Safety protocols were in place, including screening questions prior to the visit, additional usage of staff PPE, and extensive cleaning of exam room while observing appropriate contact time as indicated for disinfecting solutions.   Having some urgency--and actually wet the bed at times Now being more careful about getting up when she has the urge Now no recent incontinence No dysuria or hematuria Is limiting nighttime fluids  No daytime issues  Current Outpatient Medications on File Prior to Visit  Medication Sig Dispense Refill  . acetaminophen (TYLENOL) 500 MG tablet Take 1,000 mg by mouth every 6 (six) hours as needed for moderate pain.    Marland Kitchen aspirin 81 MG EC tablet Take 81 mg by mouth daily.    . Calcium Carbonate (CALCIUM 600 PO) Take 600 mg by mouth 2 (two) times daily.    . Cholecalciferol (VITAMIN D3) 1000 units CAPS Take 1,000 Units by mouth daily.    . Loperamide HCl (IMODIUM A-D PO) Take 2 mg by mouth daily.     Marland Kitchen LORazepam (ATIVAN) 0.5 MG tablet Take 0.5 mg by mouth at bedtime.    . Multiple Vitamin (MULTIVITAMIN) tablet Take 1 tablet by mouth daily.    . Multiple Vitamins-Minerals (OCUVITE PO) Take 1 tablet by mouth 2 (two) times daily.    . phenelzine (NARDIL) 15 MG tablet Take 30 mg by mouth 2 (two) times daily.    . [DISCONTINUED] Omega-3-acid Ethyl Esters (LOVAZA PO) Take 2,000 mg by mouth 2 (two) times daily in the am and at bedtime..       No current facility-administered medications on file prior to visit.    Allergies  Allergen Reactions  . Demerol [Meperidine]     Avoid Demerol due to patient taking MAOI drug.  Clementeen Hoof [Iodinated Diagnostic Agents] Other (See Comments)    CARDIAC ARREST  . Penicillins Anaphylaxis    Has patient had a PCN reaction  causing immediate rash, facial/tongue/throat swelling, SOB or lightheadedness with hypotension: Yes Has patient had a PCN reaction causing severe rash involving mucus membranes or skin necrosis: Yes Has patient had a PCN reaction that required hospitalization: No Has patient had a PCN reaction occurring within the last 10 years: Yes If all of the above answers are "NO", then may proceed with Cephalosporin use.   . Metoclopramide Hcl Anxiety and Rash    Past Medical History:  Diagnosis Date  . Anemia, unspecified   . Bipolar affective disorder ([place])   . Breast cancer ([place])    left  . Bronchitis   . Depression   . Depressive disorder, not elsewhere classified   . Disorder of bone and cartilage, unspecified   . External hemorrhoids   . Family history of breast cancer   . Family history of pancreatic cancer   . Family history of pancreatic cancer   . Family history of prostate cancer   . Genital herpes, unspecified   . IBS (irritable bowel syndrome)   . Lumbago   . Macular degeneration   . Personal history of radiation therapy 2009   lefet breast ca  . Spinal stenosis, unspecified region other than cervical     Past Surgical History:  Procedure Laterality Date  . ABDOMINAL HYSTERECTOMY     Total but cervix left  .  bilateral tuboplasty    . BREAST LUMPECTOMY Left 6/09   Left; high grade carcinoma in situ  . CYSTOSCOPY WITH RETROGRADE PYELOGRAM, URETEROSCOPY AND STENT PLACEMENT Bilateral 10/07/2017   Procedure: RIGHT CYSTOSCOPY WITH BILATERAL RETROGRADE PYELOGRAM, URETEROSCOPY AND STENT PLACEMENT;  Surgeon: Ardis Hughs, MD;  Location: [facility];  Service: Urology;  Laterality: Bilateral;  . DILATION AND CURETTAGE OF UTERUS     x 2  . HOLMIUM LASER APPLICATION Right 75/09/2584   Procedure: HOLMIUM LASER APPLICATION;  Surgeon: Ardis Hughs, MD;  Location: [facility];  Service: Urology;  Laterality: Right;  . MRI lumbar  05/2007   L4-L5 spinal stenosis  . RECTOCELE  REPAIR    . TONSILLECTOMY      Family History  Problem Relation Age of Onset  . Pancreatic cancer Father 80  . Prostate cancer Father 105  . Alzheimer's disease Father   . Heart attack Father   . Ulcerative colitis Mother   . Breast cancer Mother 63  . Colitis Mother        ulcerative  . Hypertension Brother   . Hyperlipidemia Brother   . Ulcerative colitis Daughter   . Colitis Daughter        ulcerative  . Breast cancer Paternal Grandmother 63  . Colon cancer Neg Hx     Social History   Socioeconomic History  . Marital status: Divorced    Spouse name: Not on file  . Number of children: 1  . Years of education: Not on file  . Highest education level: Bachelor's degree (e.g., BA, AB, BS)  Occupational History  . 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 Smoking status: Never Smoker  . Smokeless tobacco: Never Used  Vaping Use  . Vaping Use: Never used  Substance and Sexual Activity  . Alcohol use: Yes    Alcohol/week: 10.0 - 14.0 standard drinks    Types: 10 - 14 Glasses of wine per week    Comment: 1 cocktail ([place]) and 1 glass of wine daily  . Drug use: No  . Sexual activity: Not on file  Other Topics Concern  . Not on file  Social History Narrative   Tumultuous preadolescence----mom very sick and she moved around to various relatives      Has living will   Daughter is health care POA-- no alternate (but probably brother)   Would accept resuscitation attempts   Probably wouldn't want tube feedings if cognitively aware   [facility] donation to [facility] already arranged         Pt lives alone with her dog, she has 1 child, right handed, patient drinks coffee 1 cup in morning, no tea, and some soda.   Social Determinants of Health   Financial Resource Strain: Not on file  Food Insecurity: Not on file  Transportation Needs: Not on file  Physical Activity: Not on file  Stress: Not on file  Social Connections: Not on file   Intimate Partner Violence: Not on file   Review of Systems  No fever Appetite is fine Weight is stable Some bowel urgency--but no incontinence     Objective:   Physical Exam Constitutional:      Appearance: Normal appearance.  Abdominal:     Palpations: Abdomen is soft.     Tenderness: There is no abdominal tenderness.  Neurological:     Mental Status: She is alert.            Assessment &  Plan:

## 2021-05-02 DIAGNOSIS — F330 Major depressive disorder, recurrent, mild: Secondary | ICD-10-CM | POA: Diagnosis not present

## 2021-05-06 ENCOUNTER — Encounter: Payer: Self-pay | Admitting: Genetic Counselor

## 2021-05-06 ENCOUNTER — Telehealth: Payer: Self-pay | Admitting: Genetic Counselor

## 2021-05-06 NOTE — Telephone Encounter (Signed)
Revealed negative genetic testing.  Discussed that we do not know why she has breast cancer or why there is cancer in the family. It could be due to a different gene that we are not testing, or maybe our current technology may not be able to pick something up.  It will be important for her to keep in contact with genetics to keep up with whether additional testing may be needed.  One VUS identified.  We will send a copy of the result to the patient for her records.

## 2021-06-10 ENCOUNTER — Telehealth: Payer: Self-pay | Admitting: *Deleted

## 2021-06-10 DIAGNOSIS — L989 Disorder of the skin and subcutaneous tissue, unspecified: Secondary | ICD-10-CM

## 2021-06-10 DIAGNOSIS — C4490 Unspecified malignant neoplasm of skin, unspecified: Secondary | ICD-10-CM

## 2021-06-10 NOTE — Telephone Encounter (Signed)
Patient left a voicemail stating that she saw Dr. Lorelei Pont several months back for a sore on her face.  Patient stated that Dr. Lorelei Pont wanted her to see a dermatologist. Patient is requesting a referral now because the sore has not healed.

## 2021-06-10 NOTE — Telephone Encounter (Signed)
I consulted dermatology in December 2021 given my level of concern for skin cancer.  It appears that the patient did not keep her follow-up with that recommendation.  It does look like our office did make that referral.  I will reconsult dermatology for evaluation.    ICD-10-CM   1. Skin cancer  C44.90 Ambulatory referral to Dermatology    2. Facial lesion  L98.9 Ambulatory referral to Dermatology      Orders Placed This Encounter  Procedures   Ambulatory referral to Dermatology

## 2021-07-05 DIAGNOSIS — C44319 Basal cell carcinoma of skin of other parts of face: Secondary | ICD-10-CM | POA: Diagnosis not present

## 2021-07-14 ENCOUNTER — Other Ambulatory Visit: Payer: Self-pay | Admitting: *Deleted

## 2021-07-14 DIAGNOSIS — C50912 Malignant neoplasm of unspecified site of left female breast: Secondary | ICD-10-CM

## 2021-07-14 DIAGNOSIS — D649 Anemia, unspecified: Secondary | ICD-10-CM

## 2021-07-17 ENCOUNTER — Ambulatory Visit: Payer: PPO | Admitting: Oncology

## 2021-07-17 ENCOUNTER — Inpatient Hospital Stay: Payer: PPO | Admitting: Internal Medicine

## 2021-07-17 ENCOUNTER — Inpatient Hospital Stay: Payer: PPO | Attending: Internal Medicine

## 2021-07-17 ENCOUNTER — Encounter: Payer: Self-pay | Admitting: Internal Medicine

## 2021-07-17 ENCOUNTER — Other Ambulatory Visit: Payer: PPO

## 2021-07-17 VITALS — BP 160/69 | HR 85 | Temp 99.0°F | Resp 18 | Wt 127.0 lb

## 2021-07-17 DIAGNOSIS — Z923 Personal history of irradiation: Secondary | ICD-10-CM | POA: Diagnosis not present

## 2021-07-17 DIAGNOSIS — Z803 Family history of malignant neoplasm of breast: Secondary | ICD-10-CM | POA: Diagnosis not present

## 2021-07-17 DIAGNOSIS — Z86000 Personal history of in-situ neoplasm of breast: Secondary | ICD-10-CM | POA: Diagnosis not present

## 2021-07-17 DIAGNOSIS — Z7289 Other problems related to lifestyle: Secondary | ICD-10-CM | POA: Insufficient documentation

## 2021-07-17 DIAGNOSIS — D0512 Intraductal carcinoma in situ of left breast: Secondary | ICD-10-CM | POA: Diagnosis not present

## 2021-07-17 DIAGNOSIS — Z800 Family history of malignant neoplasm of digestive organs: Secondary | ICD-10-CM | POA: Insufficient documentation

## 2021-07-17 DIAGNOSIS — Z171 Estrogen receptor negative status [ER-]: Secondary | ICD-10-CM

## 2021-07-17 DIAGNOSIS — C44609 Unspecified malignant neoplasm of skin of left upper limb, including shoulder: Secondary | ICD-10-CM | POA: Diagnosis not present

## 2021-07-17 DIAGNOSIS — D649 Anemia, unspecified: Secondary | ICD-10-CM

## 2021-07-17 DIAGNOSIS — C50912 Malignant neoplasm of unspecified site of left female breast: Secondary | ICD-10-CM | POA: Diagnosis not present

## 2021-07-17 DIAGNOSIS — C44300 Unspecified malignant neoplasm of skin of unspecified part of face: Secondary | ICD-10-CM | POA: Insufficient documentation

## 2021-07-17 LAB — COMPREHENSIVE METABOLIC PANEL
ALT: 13 U/L (ref 0–44)
AST: 24 U/L (ref 15–41)
Albumin: 4.1 g/dL (ref 3.5–5.0)
Alkaline Phosphatase: 65 U/L (ref 38–126)
Anion gap: 10 (ref 5–15)
BUN: 17 mg/dL (ref 8–23)
CO2: 27 mmol/L (ref 22–32)
Calcium: 9.2 mg/dL (ref 8.9–10.3)
Chloride: 101 mmol/L (ref 98–111)
Creatinine, Ser: 0.82 mg/dL (ref 0.44–1.00)
GFR, Estimated: >60 mL/min (ref >60)
Glucose, Bld: 180 mg/dL — ABNORMAL HIGH (ref 70–99)
Potassium: 4.3 mmol/L (ref 3.5–5.1)
Sodium: 138 mmol/L (ref 135–145)
Total Bilirubin: 0.7 mg/dL (ref 0.3–1.2)
Total Protein: 7.3 g/dL (ref 6.5–8.1)

## 2021-07-17 LAB — IRON AND TIBC
Iron: 81 ug/dL (ref 28–170)
Saturation Ratios: 20 % (ref 10.4–31.8)
TIBC: 402 ug/dL (ref 250–450)
UIBC: 321 ug/dL

## 2021-07-17 LAB — CBC
HCT: 35.2 % — ABNORMAL LOW (ref 36.0–46.0)
Hemoglobin: 11.3 g/dL — ABNORMAL LOW (ref 12.0–15.0)
MCH: 29.9 pg (ref 26.0–34.0)
MCHC: 32.1 g/dL (ref 30.0–36.0)
MCV: 93.1 fL (ref 80.0–100.0)
Platelets: 245 10*3/uL (ref 150–400)
RBC: 3.78 MIL/uL — ABNORMAL LOW (ref 3.87–5.11)
RDW: 13.5 % (ref 11.5–15.5)
WBC: 4.9 10*3/uL (ref 4.0–10.5)
nRBC: 0 % (ref 0.0–0.2)

## 2021-07-17 LAB — FERRITIN: Ferritin: 68 ng/mL (ref 11–307)

## 2021-07-17 NOTE — Progress Notes (Signed)
RN Chaperoned provider with Breast Exam.   

## 2021-07-17 NOTE — Progress Notes (Signed)
Yearly follow up breast cancer. 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 Recent biopsy by a Dr. Nevada Crane ( dermatology) in [place]. Pt will be having Moh's procedure. She also thinks she has a new cancer on her right pinky~ had a hangnail that she cut with scissors. Now finger wont heal, multiple small scabs. She forgot to tell dermatologist about her finger.

## 2021-07-17 NOTE — Progress Notes (Signed)
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     HISTORY OF PRESENTING ILLNESS:  Adrienne [name] 80 y.o.  female with a history of DCIS 2009 status postlumpectomy followed by radiation is here for follow-up.  Patient denies any new lumps or bumps.  Appetite is good with no weight loss.  No worsening back pain.  Denies any blood in stools or black-colored stools.   Review of Systems  Constitutional:  Negative for chills, diaphoresis, fever, malaise/fatigue and weight loss.  HENT:  Negative for nosebleeds and sore throat.   Eyes:  Negative for double vision.  Respiratory:  Negative for cough, hemoptysis, sputum production, shortness of breath and wheezing.   Cardiovascular:  Negative for chest pain, palpitations, orthopnea and leg swelling.  Gastrointestinal:  Negative for abdominal pain, blood in stool, constipation, diarrhea, heartburn, melena, nausea and vomiting.  Genitourinary:  Negative for dysuria, frequency and urgency.  Musculoskeletal:  Negative for back pain and joint pain.  Skin: Negative.  Negative for itching and rash.  Neurological:  Negative for dizziness, tingling, focal weakness, weakness and headaches.  Endo/Heme/Allergies:  Does not bruise/bleed easily.  Psychiatric/Behavioral:  Negative for depression. The patient is not nervous/anxious and does not have insomnia.     MEDICAL HISTORY:  Past Medical History:  Diagnosis Date  . Anemia, unspecified   . Bipolar affective disorder ([place])   . Breast cancer ([place])    left  . Bronchitis   . Depression   . Depressive disorder, not elsewhere classified   . Disorder of bone and  cartilage, unspecified   . External hemorrhoids   . Family history of breast cancer   . Family history of pancreatic cancer   . Family history of pancreatic cancer   . Family history of prostate cancer   . Genital herpes, unspecified   . IBS (irritable bowel syndrome)   . Lumbago   . Macular degeneration   . Personal history of radiation therapy 2009   lefet breast ca  . Spinal stenosis, unspecified region other than cervical     SURGICAL HISTORY: Past Surgical History:  Procedure Laterality Date  . ABDOMINAL HYSTERECTOMY     Total but cervix left  . bilateral tuboplasty    . BREAST LUMPECTOMY Left 6/09   Left; high grade carcinoma in situ  . CYSTOSCOPY WITH RETROGRADE PYELOGRAM, URETEROSCOPY AND STENT PLACEMENT Bilateral 10/07/2017   Procedure: RIGHT CYSTOSCOPY WITH BILATERAL RETROGRADE PYELOGRAM, URETEROSCOPY AND STENT PLACEMENT;  Surgeon: Ardis Hughs, MD;  Location: [facility];  Service: Urology;  Laterality: Bilateral;  . DILATION AND CURETTAGE OF UTERUS     x 2  . HOLMIUM LASER APPLICATION Right [id number]/4287   Procedure: HOLMIUM LASER APPLICATION;  Surgeon: Ardis Hughs, MD;  Location: [facility];  Service: Urology;  Laterality: Right;  . MRI lumbar  05/2007   L4-L5 spinal stenosis  . RECTOCELE REPAIR    . TONSILLECTOMY      SOCIAL HISTORY: Social History   Socioeconomic History  . Marital status: Divorced    Spouse name: Not on file  . Number of children: 1  . Years of education: Not on file  . Highest education level: Bachelor's degree (e.g., BA, AB,  BS)  Occupational History  . 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 Smoking status: Never  . Smokeless tobacco: Never  Vaping Use  . Vaping Use: Never used  Substance and Sexual Activity  . Alcohol use: Yes    Alcohol/week: 10.0 - 14.0 standard drinks    Types: 10 - 14 Glasses of wine per week    Comment: 1 cocktail ([place]) and 1 glass of wine daily  . Drug  use: No  . Sexual activity: Not on file  Other Topics Concern  . Not on file  Social History Narrative   Tumultuous preadolescence----mom very sick and she moved around to various relatives      Has living will   Daughter is health care POA-- no alternate (but probably brother)   Would accept resuscitation attempts   Probably wouldn't want tube feedings if cognitively aware   [facility] donation to [facility] already arranged         Pt lives alone with her dog, she has 1 child, right handed, patient drinks coffee 1 cup in morning, no tea, and some soda.   Social Determinants of Health   Financial Resource Strain: Not on file  Food Insecurity: Not on file  Transportation Needs: Not on file  Physical Activity: Not on file  Stress: Not on file  Social Connections: Not on file  Intimate Partner Violence: Not on file    FAMILY HISTORY: Family History  Problem Relation Age of Onset  . Pancreatic cancer Father 76  . Prostate cancer Father 63  . Alzheimer's disease Father   . Heart attack Father   . Ulcerative colitis Mother   . Breast cancer Mother 44  . Colitis Mother        ulcerative  . Hypertension Brother   . Hyperlipidemia Brother   . Ulcerative colitis Daughter   . Colitis Daughter        ulcerative  . Breast cancer Paternal Grandmother 37  . Colon cancer Neg Hx     ALLERGIES:  is allergic to demerol [meperidine], ivp dye [iodinated diagnostic agents], penicillins, and metoclopramide hcl.  MEDICATIONS:  Current Outpatient Medications  Medication Sig Dispense Refill  . acetaminophen (TYLENOL) 500 MG tablet Take 1,000 mg by mouth every 6 (six) hours as needed for moderate pain.    Marland Kitchen aspirin 81 MG EC tablet Take 81 mg by mouth daily.    . Calcium Carbonate (CALCIUM 600 PO) Take 600 mg by mouth 2 (two) times daily.    . Cholecalciferol (VITAMIN D3) 1000 units CAPS Take 1,000 Units by mouth daily.    . Loperamide HCl (IMODIUM A-D PO) Take 2 mg by mouth daily.     Marland Kitchen  LORazepam (ATIVAN) 0.5 MG tablet Take 0.5 mg by mouth at bedtime.    . Multiple Vitamin (MULTIVITAMIN) tablet Take 1 tablet by mouth daily.    . phenelzine (NARDIL) 15 MG tablet Take 30 mg by mouth 2 (two) times daily.     No current facility-administered medications for this visit.      Marland Kitchen  PHYSICAL EXAMINATION: ECOG PERFORMANCE STATUS: 0 - Asymptomatic  Vitals:   07/17/21 1418  BP: (!) 160/69  Pulse: 85  Resp: 18  Temp: 99 F (37.2 C)   Filed Weights   07/17/21 1418  Weight: 127 lb (57.6 kg)    Physical Exam Vitals and nursing note reviewed.  Constitutional:      Comments: Patient resting in the bed.  Nasal cannula oxygen:   Accompanied by family  Alone  Patient sitting.   HENT:     Head: Normocephalic and atraumatic.     Mouth/Throat:     Mouth: Mucous membranes are moist.     Pharynx: No oropharyngeal exudate.  Eyes:     Pupils: Pupils are equal, round, and reactive to light.  Cardiovascular:     Rate and Rhythm: Normal rate and regular rhythm.  Pulmonary:     Effort: Pulmonary effort is normal. No respiratory distress.     Breath sounds: Normal breath sounds. No wheezing.  Abdominal:     General: Bowel sounds are normal. There is no distension.     Palpations: Abdomen is soft. There is no mass.     Tenderness: There is no abdominal tenderness. There is no guarding or rebound.  Musculoskeletal:        General: No tenderness. Normal range of motion.     Cervical back: Normal range of motion and neck supple.  Skin:    General: Skin is warm.     Comments: Right and left BREAST exam [in the presence of nurse]- no unusual skin changes or dominant masses felt. Surgical scars noted.    Neurological:     Mental Status: She is alert and oriented to person, place, and time.  Psychiatric:        Mood and Affect: Affect normal.        Judgment: Judgment normal.     LABORATORY DATA:  I have reviewed the data as listed Lab Results  Component Value Date    WBC 4.9 07/17/2021   HGB 11.3 (L) 07/17/2021   HCT 35.2 (L) 07/17/2021   MCV 93.1 07/17/2021   PLT 245 07/17/2021   Recent Labs    07/17/21 1410  NA 138  K 4.3  CL 101  CO2 27  GLUCOSE 180*  BUN 17  CREATININE 0.82  CALCIUM 9.2  GFRNONAA >60  PROT 7.3  ALBUMIN 4.1  AST 24  ALT 13  ALKPHOS 65  BILITOT 0.7    RADIOGRAPHIC STUDIES: I have personally reviewed the radiological images as listed and agreed with the findings in the report. No results found.  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 Margins were negative. Estrogen receptor was 0% and progesterone receptor 0%.  S/p radiation.  Stable.  No evidence of recurrence.  Mammogram 2021 September normal limits.  Will order mammogram.  # Mild anemia: 10-11- STABLE;  She has a longstanding history of a mild normocytic normochromic anemia. Hemoglobin has ranged between 10.6 - 10.9.  She has no evidence of renal insufficiency. Workup for multiple myeloma was negative. She denies any melena or hematochezia.  Colonoscopy 5-6 years ago was negative.       # Skin cancer- left face/right ring finger-[[facility]]  # DISPOSITION:  # mammogram # follow up in  1 year for MD assessment, labs [facility] with diff, CMP, ferritin, iron studies]-Dr.B    All questions were answered. The patient knows to call the clinic with any problems, questions or concerns.       Cammie Sickle, MD 07/17/2021 3:05 PM

## 2021-07-17 NOTE — Assessment & Plan Note (Signed)
#  high-grade DCIS of the left breaststatus post lumpectomy on 07/18/2008. 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 Margins were negative. Estrogen receptor was 0% and progesterone receptor 0%.  S/p radiation.  Stable.  No evidence of recurrence.  Mammogram 2021 September normal limits.  Will order mammogram.  # Mild anemia: 10-11- STABLE;  She has a longstanding history of a mild normocytic normochromic anemia. Hemoglobin has ranged between 10.6 - 10.9. She has no evidence of renal insufficiency. Workup for multiple myeloma was negative. She denies any melena or hematochezia. Colonoscopy5-6 years ago was negative.      # Skin cancer- left face/right ring finger-[[facility]]  # DISPOSITION:  # mammogram # follow up in  1 year for MD assessment, labs [facility] with diff, CMP, ferritin, iron studies]-Dr.B

## 2021-07-22 ENCOUNTER — Ambulatory Visit
Admission: RE | Admit: 2021-07-22 | Discharge: 2021-07-22 | Disposition: A | Discharge status: Home / Self Care | Payer: PPO | Source: Ambulatory Visit | Attending: Dermatology | Admitting: Dermatology

## 2021-07-22 ENCOUNTER — Other Ambulatory Visit: Payer: Self-pay | Admitting: Dermatology

## 2021-07-22 DIAGNOSIS — L218 Other seborrheic dermatitis: Secondary | ICD-10-CM | POA: Diagnosis not present

## 2021-07-22 DIAGNOSIS — L03012 Cellulitis of left finger: Secondary | ICD-10-CM | POA: Diagnosis not present

## 2021-07-22 DIAGNOSIS — L03011 Cellulitis of right finger: Secondary | ICD-10-CM

## 2021-07-22 DIAGNOSIS — M19041 Primary osteoarthritis, right hand: Secondary | ICD-10-CM | POA: Diagnosis not present

## 2021-07-22 DIAGNOSIS — S50819A Abrasion of unspecified forearm, initial encounter: Secondary | ICD-10-CM | POA: Diagnosis not present

## 2021-07-24 ENCOUNTER — Telehealth: Payer: Self-pay | Admitting: *Deleted

## 2021-07-24 NOTE — Telephone Encounter (Signed)
Patient called stating that she is having some surgery next week and needs to know what antibiotics she is allergic to. Advised patient that I can print off a copy of her medications and allergies for her to pick up if she would like me to. Patient stated that she would love to have two copies one for the surgeon and one for her to have. Copies printed and left up front for patient to pickup.

## 2021-07-30 DIAGNOSIS — C44319 Basal cell carcinoma of skin of other parts of face: Secondary | ICD-10-CM | POA: Diagnosis not present

## 2021-08-13 ENCOUNTER — Other Ambulatory Visit: Payer: Self-pay

## 2021-08-13 ENCOUNTER — Ambulatory Visit (INDEPENDENT_AMBULATORY_CARE_PROVIDER_SITE_OTHER): Payer: PPO | Admitting: Internal Medicine

## 2021-08-13 ENCOUNTER — Encounter: Payer: Self-pay | Admitting: Internal Medicine

## 2021-08-13 VITALS — BP 126/72 | HR 98 | Temp 98.0°F | Ht 59.5 in | Wt 127.0 lb

## 2021-08-13 DIAGNOSIS — F339 Major depressive disorder, recurrent, unspecified: Secondary | ICD-10-CM

## 2021-08-13 DIAGNOSIS — R7309 Other abnormal glucose: Secondary | ICD-10-CM | POA: Diagnosis not present

## 2021-08-13 DIAGNOSIS — Z0000 Encounter for general adult medical examination without abnormal findings: Secondary | ICD-10-CM

## 2021-08-13 DIAGNOSIS — D0512 Intraductal carcinoma in situ of left breast: Secondary | ICD-10-CM

## 2021-08-13 DIAGNOSIS — Z7189 Other specified counseling: Secondary | ICD-10-CM | POA: Diagnosis not present

## 2021-08-13 DIAGNOSIS — G629 Polyneuropathy, unspecified: Secondary | ICD-10-CM

## 2021-08-13 DIAGNOSIS — I700 Atherosclerosis of aorta: Secondary | ICD-10-CM | POA: Diagnosis not present

## 2021-08-13 LAB — GLUCOSE, RANDOM: Glucose, Bld: 96 mg/dL (ref 70–99)

## 2021-08-13 LAB — HEMOGLOBIN A1C: Hgb A1c MFr Bld: 5.4 % (ref 4.6–6.5)

## 2021-08-13 NOTE — Progress Notes (Signed)
Subjective:    Patient ID: Adrienne [name], female    DOB: 08/10/1941, 80 y.o.   MRN: [id number]  HPI Here for Medicare wellness visit and follow up of chronic health conditions This visit occurred during the SARS-CoV-2 public health emergency.  Safety protocols were in place, including screening questions prior to the visit, additional usage of staff PPE, and extensive cleaning of exam room while observing appropriate contact time as indicated for disinfecting solutions.   Reviewed form and advanced directives Reviewed other doctors Still enjoys nightly [place] No tobacco Vision and hearing are fine No falls Walks daily (dog) and gardens. No resistance Chronic depression Independent with instrumental ADLs No worrisome memory issues  Is scheduled for Moh's surgery on left face in 2 weeks Somewhat nervous about this  Some nocturia---able to get back to sleep No incontinence---unless particularly hard cough  Some trouble getting to sleep---"a lot on my mind" Thinking of moving back to [place]--family and grandson Depression mostly controlled---she is more bored than anything else Continues with Dr Casimiro Needle  Follows with oncology for the DCIS Yearly mammogram  Did have aortic atherosclerosis on past CT scan  Still some problems with right hand Doxycycline for 5th finger cellulitis--but not better Still has trouble stretching hand completely--no clear diagnosis  Current Outpatient Medications on File Prior to Visit  Medication Sig Dispense Refill   acetaminophen (TYLENOL) 500 MG tablet Take 1,000 mg by mouth every 6 (six) hours as needed for moderate pain.     aspirin 81 MG EC tablet Take 81 mg by mouth daily.     Calcium Carbonate (CALCIUM 600 PO) Take 600 mg by mouth 2 (two) times daily.     Cholecalciferol (VITAMIN D3) 1000 units CAPS Take 1,000 Units by mouth daily.     Loperamide HCl (IMODIUM A-D PO) Take 2 mg by mouth daily.      LORazepam (ATIVAN) 0.5 MG tablet  Take 0.5 mg by mouth at bedtime.     Multiple Vitamin (MULTIVITAMIN) tablet Take 1 tablet by mouth daily.     phenelzine (NARDIL) 15 MG tablet Take 30 mg by mouth 2 (two) times daily.     [DISCONTINUED] Omega-3-acid Ethyl Esters (LOVAZA PO) Take 2,000 mg by mouth 2 (two) times daily in the am and at bedtime..       No current facility-administered medications on file prior to visit.    Allergies  Allergen Reactions   Demerol [Meperidine]     Avoid Demerol due to patient taking MAOI drug.   Ivp Dye [Iodinated Diagnostic Agents] Other (See Comments)    CARDIAC ARREST   Penicillins Anaphylaxis    Has patient had a PCN reaction causing immediate rash, facial/tongue/throat swelling, SOB or lightheadedness with hypotension: Yes Has patient had a PCN reaction causing severe rash involving mucus membranes or skin necrosis: Yes Has patient had a PCN reaction that required hospitalization: No Has patient had a PCN reaction occurring within the last 10 years: Yes If all of the above answers are "NO", then may proceed with Cephalosporin use.    Metoclopramide Hcl Anxiety and Rash    Past Medical History:  Diagnosis Date   Anemia, unspecified    Bipolar affective disorder ([place])    Breast cancer ([place])    left   Bronchitis    Depression    Depressive disorder, not elsewhere classified    Disorder of bone and cartilage, unspecified    External hemorrhoids    Family history of breast cancer  Family history of pancreatic cancer    Family history of pancreatic cancer    Family history of prostate cancer    Genital herpes, unspecified    IBS (irritable bowel syndrome)    Lumbago    Macular degeneration    Personal history of radiation therapy 2009   lefet breast ca   Spinal stenosis, unspecified region other than cervical     Past Surgical History:  Procedure Laterality Date   ABDOMINAL HYSTERECTOMY     Total but cervix left   bilateral tuboplasty     BREAST LUMPECTOMY Left 6/09    Left; high grade carcinoma in situ   CYSTOSCOPY WITH RETROGRADE PYELOGRAM, URETEROSCOPY AND STENT PLACEMENT Bilateral 10/07/2017   Procedure: RIGHT CYSTOSCOPY [place], URETEROSCOPY AND STENT PLACEMENT;  Surgeon: Ardis Hughs, MD;  Location[facility];  Service: Urology;  Laterality: Bilateral;   DILATION AND CURETTAGE OF UTERUS     x 2   HOLMIUM LASER APPLICATION Right [id number]   Procedure: HOLMIUM LASER APPLICATION;  Surgeon: Ardis Hughs, MD;  [facility]: [facility];  Service: Urology;  Laterality: Right;   MRI lumbar  05/2007   L4-L5 spinal stenosis   RECTOCELE REPAIR     TONSILLECTOMY      Family History  Problem Relation Age of Onset   Pancreatic cancer Father 50   Prostate cancer Father 60   Alzheimer's disease Father    Heart attack Father    Ulcerative colitis Mother    Breast cancer Mother 44   Colitis Mother        ulcerative   Hypertension Brother    Hyperlipidemia Brother    Ulcerative colitis Daughter    Colitis Daughter        ulcerative   Breast cancer Paternal Grandmother 22   Colon cancer Neg Hx     Social History   Socioeconomic History   Marital status: Divorced    Spouse name: Not on file   Number of children: 1   Years of education: Not on file   Highest education level: Bachelor's degree (e.g., BA, AB, BS)  Occupational History   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  Tobacco Use   Smoking status: Never   Smokeless tobacco: Never  Vaping Use   Vaping Use: Never used  Substance and Sexual Activity   Alcohol use: Yes    Alcohol/week: 10.0 - 14.0 standard drinks    Types: 10 - 14 Glasses of wine per week    Comment: 1 cocktail ([place]) and 1 glass of wine daily   Drug use: No   Sexual activity: Not on file  Other Topics Concern   Not on file  Social History Narrative   Tumultuous preadolescence----mom very sick and she moved around to various relatives      Has living will    Daughter is health care POA-- no alternate (but probably brother)   Would accept resuscitation attempts   Probably wouldn't want tube feedings if cognitively aware   [facility] donation to [facility] already arranged         Pt lives alone with her dog, she has 1 child, right handed, patient drinks coffee 1 cup in morning, no tea, and some soda.   Social Determinants of Health   Financial Resource Strain: Not on file  Food Insecurity: Not on file  Transportation Needs: Not on file  Physical Activity: Not on file  Stress: Not on file  Social Connections: Not  on file  Intimate Partner Violence: Not on file   Review of Systems Appetite is good---doesn't like cooking for one Weight fairly stable Generally sleeps okay Wears seat belt Teeth are fine--keeps up with dentist No heartburn or dysphagia No chest pain or SOB No dizziness or syncope Bowels fine--no blood Occasional back pain--nothing striking. No other joint pains     Objective:   Physical Exam Constitutional:      Appearance: Normal appearance.  HENT:     Mouth/Throat:     Comments: No lesions Eyes:     Conjunctiva/sclera: Conjunctivae normal.     Pupils: Pupils are equal, round, and reactive to light.  Cardiovascular:     Rate and Rhythm: Normal rate and regular rhythm.     Pulses: Normal pulses.     Heart sounds: No murmur heard.   No gallop.  Pulmonary:     Effort: Pulmonary effort is normal.     Breath sounds: Normal breath sounds. No wheezing or rales.  Abdominal:     Palpations: Abdomen is soft.     Tenderness: There is no abdominal tenderness.  Musculoskeletal:     Cervical back: Neck supple.     Right lower leg: No edema.     Left lower leg: No edema.  Lymphadenopathy:     Cervical: No cervical adenopathy.  Skin:    Findings: No rash.     Comments: Lesion on left cheek  Neurological:     Mental Status: She is alert and oriented to person, place, and time.     Comments: President--- "Biden, Trump,  Obama" [id number] Recall 2/3  Mild dysfunction in right hand  Psychiatric:        Mood and Affect: Mood normal.        Behavior: Behavior normal.           Assessment & Plan:

## 2021-08-13 NOTE — Assessment & Plan Note (Signed)
On CT Scan No statin after discussions

## 2021-08-13 NOTE — Assessment & Plan Note (Signed)
I have personally reviewed the Medicare Annual Wellness questionnaire and have noted 1. The patient's medical and social history 2. Their use of alcohol, tobacco or illicit drugs 3. Their current medications and supplements 4. The patient's functional ability including ADL's, fall risks, home safety risks and hearing or visual             impairment. 5. Diet and physical activities 6. Evidence for depression or mood disorders  The patients weight, height, BMI and visual acuity have been recorded in the chart I have made referrals, counseling and provided education to the patient based review of the above and I have provided the pt with a written personalized care plan for preventive services.  I have provided you with a copy of your personalized plan for preventive services. Please take the time to review along with your updated medication list.  Bivalent COVID booster when available Flu vaccine soon  Consider one last colonoscopy next year Discussed adding resistance exercise

## 2021-08-13 NOTE — Assessment & Plan Note (Signed)
See social history 

## 2021-08-13 NOTE — Assessment & Plan Note (Signed)
Will recheck now

## 2021-08-13 NOTE — Assessment & Plan Note (Signed)
Close to remission on the nardil Has lorazepam to help sleep

## 2021-08-13 NOTE — Assessment & Plan Note (Signed)
Has unclear issue with right hand---no clear diagnosis

## 2021-08-13 NOTE — Assessment & Plan Note (Signed)
Still gets yearly mammograms

## 2021-08-19 ENCOUNTER — Telehealth: Payer: Self-pay

## 2021-08-19 NOTE — Telephone Encounter (Signed)
Pt called triage stating she woke up 3 times last night to go to the restroom. Ready to try a medication. Please send to [facility].

## 2021-08-19 NOTE — Telephone Encounter (Signed)
Spoke to pt. She will call back if she continues to have an issue.

## 2021-08-27 DIAGNOSIS — C44319 Basal cell carcinoma of skin of other parts of face: Secondary | ICD-10-CM | POA: Diagnosis not present

## 2021-08-29 DIAGNOSIS — L03011 Cellulitis of right finger: Secondary | ICD-10-CM | POA: Diagnosis not present

## 2021-09-11 DIAGNOSIS — M19041 Primary osteoarthritis, right hand: Secondary | ICD-10-CM | POA: Diagnosis not present

## 2021-09-11 DIAGNOSIS — G5621 Lesion of ulnar nerve, right upper limb: Secondary | ICD-10-CM | POA: Diagnosis not present

## 2021-09-11 DIAGNOSIS — M79641 Pain in right hand: Secondary | ICD-10-CM | POA: Diagnosis not present

## 2021-09-16 ENCOUNTER — Other Ambulatory Visit: Payer: Self-pay

## 2021-09-16 ENCOUNTER — Ambulatory Visit
Admission: RE | Admit: 2021-09-16 | Discharge: 2021-09-16 | Disposition: A | Discharge status: Home / Self Care | Payer: PPO | Source: Ambulatory Visit | Attending: Internal Medicine | Admitting: Internal Medicine

## 2021-09-16 DIAGNOSIS — C50912 Malignant neoplasm of unspecified site of left female breast: Secondary | ICD-10-CM

## 2021-09-16 DIAGNOSIS — Z171 Estrogen receptor negative status [ER-]: Secondary | ICD-10-CM | POA: Diagnosis not present

## 2021-09-16 DIAGNOSIS — Z1231 Encounter for screening mammogram for malignant neoplasm of breast: Secondary | ICD-10-CM | POA: Insufficient documentation

## 2021-09-16 DIAGNOSIS — Z853 Personal history of malignant neoplasm of breast: Secondary | ICD-10-CM | POA: Diagnosis not present

## 2021-09-16 DIAGNOSIS — Z08 Encounter for follow-up examination after completed treatment for malignant neoplasm: Secondary | ICD-10-CM | POA: Diagnosis not present

## 2021-09-30 DIAGNOSIS — F330 Major depressive disorder, recurrent, mild: Secondary | ICD-10-CM | POA: Diagnosis not present

## 2021-10-16 ENCOUNTER — Other Ambulatory Visit: Payer: Self-pay | Admitting: Orthopedic Surgery

## 2021-10-16 DIAGNOSIS — G5601 Carpal tunnel syndrome, right upper limb: Secondary | ICD-10-CM | POA: Diagnosis not present

## 2021-10-16 DIAGNOSIS — G5621 Lesion of ulnar nerve, right upper limb: Secondary | ICD-10-CM | POA: Insufficient documentation

## 2021-11-26 ENCOUNTER — Telehealth: Payer: Self-pay | Admitting: Internal Medicine

## 2021-11-26 DIAGNOSIS — M79643 Pain in unspecified hand: Secondary | ICD-10-CM

## 2021-11-26 NOTE — Telephone Encounter (Signed)
Pt has called to state she is getting surgery from her hand to her elbow on Dec. 16th  She has called to request getting a rheumatoid arthritis lab completed before the appt.  Please advise   Cell and Home phone both ok to leave a VM for the pt.

## 2021-11-26 NOTE — Telephone Encounter (Signed)
Does patient need appointment with you or just for orders to be submitted?

## 2021-11-27 NOTE — Telephone Encounter (Signed)
Called patient and got her scheduled for 12/1 @230  at [facility]

## 2021-11-27 NOTE — Telephone Encounter (Signed)
Okay to just set up lab---orders in

## 2021-11-28 ENCOUNTER — Other Ambulatory Visit: Payer: PPO

## 2021-12-03 ENCOUNTER — Other Ambulatory Visit (INDEPENDENT_AMBULATORY_CARE_PROVIDER_SITE_OTHER): Payer: PPO

## 2021-12-03 DIAGNOSIS — M79643 Pain in unspecified hand: Secondary | ICD-10-CM | POA: Diagnosis not present

## 2021-12-03 LAB — SEDIMENTATION RATE: Sed Rate: 26 mm/hr (ref 0–30)

## 2021-12-04 LAB — RHEUMATOID FACTOR: Rheumatoid fact SerPl-aCnc: <14 IU/mL (ref <14)

## 2021-12-05 LAB — CYCLIC CITRUL PEPTIDE ANTIBODY, IGG/IGA: Cyclic Citrullin Peptide Ab: 1 units (ref 0–19)

## 2021-12-10 ENCOUNTER — Telehealth: Payer: Self-pay | Admitting: Internal Medicine

## 2021-12-10 NOTE — Telephone Encounter (Signed)
Pt called in requesting a call back regarding test results also would a copy be mail to her home address. Please advise [phone]

## 2021-12-11 NOTE — Telephone Encounter (Signed)
Spoke to pt. Mailed copy to pt.

## 2021-12-13 ENCOUNTER — Encounter (HOSPITAL_BASED_OUTPATIENT_CLINIC_OR_DEPARTMENT_OTHER): Payer: Self-pay

## 2021-12-13 ENCOUNTER — Ambulatory Visit (HOSPITAL_BASED_OUTPATIENT_CLINIC_OR_DEPARTMENT_OTHER): Admit: 2021-12-13 | Payer: PPO | Admitting: Orthopedic Surgery

## 2021-12-13 SURGERY — CARPAL TUNNEL RELEASE
Anesthesia: Choice | Laterality: Right

## 2022-01-01 DIAGNOSIS — F330 Major depressive disorder, recurrent, mild: Secondary | ICD-10-CM | POA: Diagnosis not present

## 2022-03-27 DIAGNOSIS — F330 Major depressive disorder, recurrent, mild: Secondary | ICD-10-CM | POA: Diagnosis not present

## 2022-07-16 ENCOUNTER — Inpatient Hospital Stay: Payer: Medicare HMO | Admitting: Internal Medicine

## 2022-07-16 ENCOUNTER — Inpatient Hospital Stay: Payer: Medicare HMO | Attending: Internal Medicine

## 2022-07-16 ENCOUNTER — Encounter: Payer: Self-pay | Admitting: Internal Medicine

## 2022-07-21 DIAGNOSIS — R69 Illness, unspecified: Secondary | ICD-10-CM | POA: Diagnosis not present

## 2022-07-21 DIAGNOSIS — F330 Major depressive disorder, recurrent, mild: Secondary | ICD-10-CM | POA: Diagnosis not present

## 2022-08-01 ENCOUNTER — Inpatient Hospital Stay: Payer: Medicare HMO | Admitting: Internal Medicine

## 2022-08-01 ENCOUNTER — Inpatient Hospital Stay: Payer: Medicare HMO

## 2022-08-08 ENCOUNTER — Ambulatory Visit: Payer: Medicare HMO | Admitting: Internal Medicine

## 2022-08-08 ENCOUNTER — Other Ambulatory Visit: Payer: Medicare HMO

## 2022-08-11 ENCOUNTER — Encounter: Payer: Self-pay | Admitting: Gastroenterology

## 2022-08-15 ENCOUNTER — Other Ambulatory Visit: Payer: Self-pay

## 2022-08-15 DIAGNOSIS — C50912 Malignant neoplasm of unspecified site of left female breast: Secondary | ICD-10-CM

## 2022-08-18 ENCOUNTER — Inpatient Hospital Stay (HOSPITAL_BASED_OUTPATIENT_CLINIC_OR_DEPARTMENT_OTHER): Payer: Medicare HMO | Admitting: Internal Medicine

## 2022-08-18 ENCOUNTER — Inpatient Hospital Stay: Payer: Medicare HMO | Attending: Internal Medicine

## 2022-08-18 DIAGNOSIS — C50912 Malignant neoplasm of unspecified site of left female breast: Secondary | ICD-10-CM

## 2022-08-18 DIAGNOSIS — Z923 Personal history of irradiation: Secondary | ICD-10-CM | POA: Diagnosis not present

## 2022-08-18 DIAGNOSIS — Z86000 Personal history of in-situ neoplasm of breast: Secondary | ICD-10-CM | POA: Diagnosis not present

## 2022-08-18 DIAGNOSIS — D0512 Intraductal carcinoma in situ of left breast: Secondary | ICD-10-CM

## 2022-08-18 DIAGNOSIS — D649 Anemia, unspecified: Secondary | ICD-10-CM | POA: Diagnosis not present

## 2022-08-18 LAB — CBC WITH DIFFERENTIAL/PLATELET
Abs Immature Granulocytes: 0.05 10*3/uL (ref 0.00–0.07)
Basophils Absolute: 0 10*3/uL (ref 0.0–0.1)
Basophils Relative: 1 %
Eosinophils Absolute: 0.1 10*3/uL (ref 0.0–0.5)
Eosinophils Relative: 3 %
HCT: 35.2 % — ABNORMAL LOW (ref 36.0–46.0)
Hemoglobin: 11.1 g/dL — ABNORMAL LOW (ref 12.0–15.0)
Immature Granulocytes: 1 %
Lymphocytes Relative: 25 %
Lymphs Abs: 1.2 10*3/uL (ref 0.7–4.0)
MCH: 29.3 pg (ref 26.0–34.0)
MCHC: 31.5 g/dL (ref 30.0–36.0)
MCV: 92.9 fL (ref 80.0–100.0)
Monocytes Absolute: 0.5 10*3/uL (ref 0.1–1.0)
Monocytes Relative: 11 %
Neutro Abs: 2.7 10*3/uL (ref 1.7–7.7)
Neutrophils Relative %: 59 %
Platelets: 252 10*3/uL (ref 150–400)
RBC: 3.79 MIL/uL — ABNORMAL LOW (ref 3.87–5.11)
RDW: 13.1 % (ref 11.5–15.5)
WBC: 4.6 10*3/uL (ref 4.0–10.5)
nRBC: 0 % (ref 0.0–0.2)

## 2022-08-18 LAB — IRON AND TIBC
Iron: 87 ug/dL (ref 28–170)
Saturation Ratios: 23 % (ref 10.4–31.8)
TIBC: 378 ug/dL (ref 250–450)
UIBC: 291 ug/dL

## 2022-08-18 LAB — COMPREHENSIVE METABOLIC PANEL
ALT: 17 U/L (ref 0–44)
AST: 28 U/L (ref 15–41)
Albumin: 4.3 g/dL (ref 3.5–5.0)
Alkaline Phosphatase: 57 U/L (ref 38–126)
Anion gap: 10 (ref 5–15)
BUN: 13 mg/dL (ref 8–23)
CO2: 27 mmol/L (ref 22–32)
Calcium: 9.4 mg/dL (ref 8.9–10.3)
Chloride: 100 mmol/L (ref 98–111)
Creatinine, Ser: 0.76 mg/dL (ref 0.44–1.00)
GFR, Estimated: >60 mL/min (ref >60)
Glucose, Bld: 124 mg/dL — ABNORMAL HIGH (ref 70–99)
Potassium: 4.4 mmol/L (ref 3.5–5.1)
Sodium: 137 mmol/L (ref 135–145)
Total Bilirubin: 0.6 mg/dL (ref 0.3–1.2)
Total Protein: 7.5 g/dL (ref 6.5–8.1)

## 2022-08-18 LAB — FERRITIN: Ferritin: 69 ng/mL (ref 11–307)

## 2022-08-18 NOTE — Progress Notes (Unsigned)
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    HISTORY OF PRESENTING ILLNESS: Alone.  Ambulating independently. Adrienne [name] 81 y.o.  female with a history of DCIS ER negative 2009 status postlumpectomy followed by radiation is here for follow-up.  Patient is tearful today due to recent loss of her dog.   Patient denies any new lumps or bumps.  Appetite is good with no weight loss.  No worsening back pain.  Denies any blood in stools or black-colored stools.   Review of Systems  Constitutional:  Negative for chills, diaphoresis, fever, malaise/fatigue and weight loss.  HENT:  Negative for nosebleeds and sore throat.   Eyes:  Negative for double vision.  Respiratory:  Negative for cough, hemoptysis, sputum production, shortness of breath and wheezing.   Cardiovascular:  Negative for chest pain, palpitations, orthopnea and leg swelling.  Gastrointestinal:  Negative for abdominal pain, blood in stool, constipation, diarrhea, heartburn, melena, nausea and vomiting.  Genitourinary:  Negative for dysuria, frequency and urgency.  Musculoskeletal:  Negative for back pain and joint pain.  Skin: Negative.  Negative for itching and rash.  Neurological:  Negative for dizziness, tingling, focal weakness, weakness and headaches.  Endo/Heme/Allergies:  Does not bruise/bleed easily.  Psychiatric/Behavioral:  Negative for depression. The patient is not nervous/anxious and does not have insomnia.      MEDICAL HISTORY:  Past Medical History:  Diagnosis Date   Anemia, unspecified    Bipolar  affective disorder ([place])    Breast cancer ([place])    left   Bronchitis    Depression    Depressive disorder, not elsewhere classified    Disorder of bone and cartilage, unspecified    External hemorrhoids    Family history of breast cancer    Family history of pancreatic cancer    Family history of pancreatic cancer    Family history of prostate cancer    Genital herpes, unspecified    IBS (irritable bowel syndrome)    Lumbago    Macular degeneration    Personal history of radiation therapy 2009   lefet breast ca   Spinal stenosis, unspecified region other than cervical     SURGICAL HISTORY: Past Surgical History:  Procedure Laterality Date   ABDOMINAL HYSTERECTOMY     Total but cervix left   bilateral tuboplasty     BREAST LUMPECTOMY Left 6/09   Left; high grade carcinoma in situ   CYSTOSCOPY WITH RETROGRADE PYELOGRAM, URETEROSCOPY AND STENT PLACEMENT Bilateral 10/07/2017   Procedure: RIGHT CYSTOSCOPY [place], URETEROSCOPY AND STENT PLACEMENT;  Surgeon: Ardis Hughs, MD;  Location: [facility];  Service: Urology;  Laterality: Bilateral;   DILATION AND CURETTAGE OF UTERUS     x 2   HOLMIUM LASER APPLICATION Right 27/25/3664   Procedure: HOLMIUM LASER APPLICATION;  Surgeon: Ardis Hughs, MD;  Location: [facility];  Service: Urology;  Laterality: Right;   MRI lumbar  05/2007   L4-L5 spinal stenosis   RECTOCELE REPAIR     TONSILLECTOMY      SOCIAL HISTORY: Social History   Socioeconomic History   Marital status: Divorced    Spouse name: Not  on file   Number of children: 1   Years of education: Not on file   Highest education level: Bachelor's degree (e.g., BA, AB, BS)  Occupational History   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  Tobacco Use   Smoking status: Never   Smokeless tobacco: Never  Vaping Use   Vaping Use: Never used  Substance and Sexual Activity   Alcohol use: Yes    Alcohol/week: 10.0 - 14.0  standard drinks of alcohol    Types: 10 - 14 Glasses of wine per week    Comment: 1 cocktail (manhattan) and 1 glass of wine daily   Drug use: No   Sexual activity: Not on file  Other Topics Concern   Not on file  Social History Narrative   Tumultuous preadolescence----mom very sick and she moved around to various relatives      Has living will   Daughter is health care POA-- no alternate (but probably brother)   Would accept resuscitation attempts   Probably wouldn't want tube feedings if cognitively aware   [facility] donation to [facility] already arranged         Pt lives alone with her dog, she has 1 child, right handed, patient drinks coffee 1 cup in morning, no tea, and some soda.   Social Determinants of Health   Financial Resource Strain: Low Risk  (08/19/2022)   Overall Financial Resource Strain (CARDIA)    Difficulty of Paying Living Expenses: Not hard at all  Food Insecurity: No Food Insecurity (08/19/2022)   Hunger Vital Sign    Worried About Running Out of Food in the Last Year: Never true    Ran Out of Food in the Last Year: Never true  Transportation Needs: No Transportation Needs (08/19/2022)   PRAPARE - Hydrologist (Medical): No    Lack of Transportation (Non-Medical): No  Physical Activity: Inactive (08/19/2022)   Exercise Vital Sign    Days of Exercise per Week: 0 days    Minutes of Exercise per Session: 0 min  Stress: Stress Concern Present (08/19/2022)   [place]    Feeling of Stress : To some extent  Social Connections: Socially Isolated (08/19/2022)   Social Connection and Isolation Panel [NHANES]    Frequency of Communication with Friends and Family: Three times a week    Frequency of Social Gatherings with Friends and Family: Three times a week    Attends Religious Services: Never    Active Member of Clubs or Organizations: No    Attends Archivist  Meetings: Never    Marital Status: Divorced  Human resources officer Violence: Not At Risk (08/19/2022)   Humiliation, Afraid, Rape, and Kick questionnaire    Fear of Current or Ex-Partner: No    Emotionally Abused: No    Physically Abused: No    Sexually Abused: No    FAMILY HISTORY: Family History  Problem Relation Age of Onset   Pancreatic cancer Father 74   Prostate cancer Father 97   Alzheimer's disease Father    Heart attack Father    Ulcerative colitis Mother    Breast cancer Mother 67   Colitis Mother        ulcerative   Hypertension Brother    Hyperlipidemia Brother    Ulcerative colitis Daughter    Colitis Daughter        ulcerative   Breast cancer Paternal Grandmother 49  Colon cancer Neg Hx     ALLERGIES:  is allergic to demerol [meperidine], ivp dye [iodinated contrast media], penicillins, and metoclopramide hcl.  MEDICATIONS:  Current Outpatient Medications  Medication Sig Dispense Refill   acetaminophen (TYLENOL) 500 MG tablet Take 1,000 mg by mouth every 6 (six) hours as needed for moderate pain.     aspirin 81 MG EC tablet Take 81 mg by mouth daily.     Calcium Carbonate (CALCIUM 600 PO) Take 600 mg by mouth 2 (two) times daily.     Cholecalciferol (VITAMIN D3) 1000 units CAPS Take 1,000 Units by mouth daily.     Loperamide HCl (IMODIUM A-D PO) Take 2 mg by mouth daily.      LORazepam (ATIVAN) 0.5 MG tablet Take 0.5 mg by mouth at bedtime.     Multiple Vitamin (MULTIVITAMIN) tablet Take 1 tablet by mouth daily.     phenelzine (NARDIL) 15 MG tablet Take 30 mg by mouth 2 (two) times daily.     No current facility-administered medications for this visit.      Marland Kitchen  PHYSICAL EXAMINATION: ECOG PERFORMANCE STATUS: 0 - Asymptomatic  Vitals:   08/18/22 1000  BP: (!) 163/84  Pulse: 90  Resp: 20  Temp: 98.3 F (36.8 C)   Filed Weights   08/18/22 1000  Weight: 119 lb 14.4 oz (54.4 kg)   GENERAL: Well-nourished well-developed; Alert, no distress and  comfortable.  Alone.  Patient tearful sobbing. EYES: no pallor or icterus OROPHARYNX: no thrush or ulceration; NECK: supple; no lymph nodes felt. LYMPH:  no palpable lymphadenopathy in the axillary or inguinal regions LUNGS: Decreased breath sounds auscultation bilaterally. No wheeze or crackles HEART/CVS: regular rate & rhythm and no murmurs; No lower extremity edema ABDOMEN:abdomen soft, non-tender and normal bowel sounds. No hepatomegaly or splenomegaly.  Musculoskeletal:no cyanosis of digits and no clubbing  PSYCH: alert & oriented x 3 with fluent speech NEURO: no focal motor/sensory deficits SKIN:  no rashes or significant lesions    LABORATORY DATA:  I have reviewed the data as listed Lab Results  Component Value Date   WBC 4.6 08/18/2022   HGB 11.1 (L) 08/18/2022   HCT 35.2 (L) 08/18/2022   MCV 92.9 08/18/2022   PLT 252 08/18/2022   Recent Labs    08/18/22 0930  NA 137  K 4.4  CL 100  CO2 27  GLUCOSE 124*  BUN 13  CREATININE 0.76  CALCIUM 9.4  GFRNONAA >60  PROT 7.5  ALBUMIN 4.3  AST 28  ALT 17  ALKPHOS 57  BILITOT 0.6    RADIOGRAPHIC STUDIES: I have personally reviewed the radiological images as listed and agreed with the findings in the report. No results found.  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  Estrogen receptor was 0% and progesterone receptor 0%.  S/p radiation.  Patient not on any endocrine therapy. No evidence of recurrence.  Mammogram 2022 September normal limits.  Will order mammogram.  # Mild anemia: 10-11- STABLE;  She has a longstanding history of a mild normocytic normochromic anemia. Hemoglobin has ranged between 10.6 - 10.9.  She has no evidence of renal insufficiency. Workup for multiple myeloma was negative. She denies any melena or hematochezia.  Colonoscopy 5-6 years ago was negative.       # Skin cancer- left face/right ring  finger-[[facility]]  print out of labs  # DISPOSITION:  #  print out of labs  # mammogram BIL in SEP 2023 # follow up in  1 year for MD assessment, labs [facility] with diff, CMP, ferritin, iron studies]-Dr.B    All questions were answered. The patient knows to call the clinic with any problems, questions or concerns.       Cammie Sickle, MD 08/19/2022 7:15 PM

## 2022-08-18 NOTE — Progress Notes (Unsigned)
Patient is tearful today due to recent loss of her dog.    Would like MD to check her ears due to skin "flaking".  Referral for CSW entered:  Very emotional/tearful due to the recent loss of her dog.  Also states she has been discharged by her psychiatry office and will need refills on her psych med.  Please reach out to patient to offer support and guidance on resources.

## 2022-08-18 NOTE — Assessment & Plan Note (Addendum)
#  high-grade DCIS of the left breaststatus post lumpectomy on 07/18/2008. 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 Margins were negative. Estrogen receptor was 0% and progesterone receptor 0%.  S/p radiation.  Stable.  No evidence of recurrence.  Mammogram 2022 September normal limits.  Will order mammogram. Not on   # Mild anemia: 10-11- STABLE;  She has a longstanding history of a mild normocytic normochromic anemia. Hemoglobin has ranged between 10.6 - 10.9. She has no evidence of renal insufficiency. Workup for multiple myeloma was negative. She denies any melena or hematochezia. Colonoscopy5-6 years ago was negative.      # Skin cancer- left face/right ring finger-[[facility]]  print out of labs  # DISPOSITION:  # print out of labs  # mammogram BIL in SEP 2023 # follow up in  1 year for MD assessment, labs [facility] with diff, CMP, ferritin, iron studies]-Dr.B

## 2022-08-19 ENCOUNTER — Inpatient Hospital Stay: Payer: Medicare HMO | Admitting: Licensed Clinical Social Worker

## 2022-08-19 DIAGNOSIS — D0512 Intraductal carcinoma in situ of left breast: Secondary | ICD-10-CM

## 2022-08-19 NOTE — Progress Notes (Signed)
[name]  Initial Assessment   Adrienne [name] is a 81 y.o. year old female contacted by phone. Clinical Social Work was referred by medical provider for assessment of psychosocial needs.   SDOH (Social Determinants of Health) assessments performed: Yes SDOH Interventions    Flowsheet Row Most Recent Value  SDOH Interventions   Food Insecurity Interventions Intervention Not Indicated  Financial Strain Interventions Intervention Not Indicated  Housing Interventions Intervention Not Indicated  Physical Activity Interventions Intervention Not Indicated  Stress Interventions Intervention Not Indicated  Social Connections Interventions Intervention Not Indicated  Transportation Interventions Intervention Not Indicated  Depression Interventions/Treatment  Referral to Psychiatry, Counseling       SDOH Screenings   Alcohol Screen: Low Risk  (08/19/2022)   Alcohol Screen    Last Alcohol Screening Score (AUDIT): 0  Depression (PHQ2-9): Medium Risk (08/19/2022)   Depression (PHQ2-9)    PHQ-2 Score: 5  Financial Resource Strain: Low Risk  (08/19/2022)   Overall Financial Resource Strain (CARDIA)    Difficulty of Paying Living Expenses: Not hard at all  Food Insecurity: No Food Insecurity (08/19/2022)   Hunger Vital Sign    Worried About Running Out of Food in the Last Year: Never true    Ran Out of Food in the Last Year: Never true  Housing: Low Risk  (08/19/2022)   Housing    Last Housing Risk Score: 0  Physical Activity: Inactive (08/19/2022)   Exercise Vital Sign    Days of Exercise per Week: 0 days    Minutes of Exercise per Session: 0 min  Social Connections: Socially Isolated (08/19/2022)   Social Connection and Isolation Panel [NHANES]    Frequency of Communication with Friends and Family: Three times a week    Frequency of Social Gatherings with Friends and Family: Three times a week    Attends Religious Services: Never    Active Member of Clubs or  Organizations: No    Attends Archivist Meetings: Never    Marital Status: Divorced  Stress: Stress Concern Present (08/19/2022)   [employer] [place]    Feeling of Stress : To some extent  Tobacco Use: Low Risk  (08/13/2021)   Patient History    Smoking Tobacco Use: Never    Smokeless Tobacco Use: Never    Passive Exposure: Not on file  Transportation Needs: No Transportation Needs (08/19/2022)   PRAPARE - Transportation    Lack of Transportation (Medical): No    Lack of Transportation (Non-Medical): No     Distress Screen completed: No    05/12/2016   10:33 AM  ONCBCN DISTRESS SCREENING  Screening Type Initial Screening  Distress experienced in past week (1-10) 0      Family/Social Information:  Housing Arrangement: patient lives alone, daughter is main contact   Pcolinsky,Leslie  [phone]   Family members/support persons in your life? Family and Friends Transportation concerns: no  Employment: Retired  .  Income source: Paediatric nurse concerns: No Type of concern: None Food access concerns: no Religious or spiritual practice: Yes Services Currently in place:  [employer] Medicare  Coping/ Adjustment to diagnosis: Patient understands treatment plan and what happens next? Patient is not currently under treatment Concerns about diagnosis and/or treatment:  Patient sad because her dogs died last week Patient reported stressors:  sadness over death of dog Hopes and/or priorities: to move up [place] closer to family Patient enjoys time with family/ friends Current coping skills/ strengths:  Financial means , Religious Affilitation    SUMMARY: Current SDOH Barriers:  None  Clinical Social Work Clinical Goal(s):  No clinical social work goals at this time  Interventions: Discussed common feeling and emotions when being diagnosed with cancer, and the importance of support during  treatment Informed patient of the support team roles and support services at [facility] Provided [place] contact information and encouraged patient to call with any questions or concerns Discussed different options for patient to improve daily activity.  Will send list of therapy/counseling providers in the area, as well as psychiatrist, patient stated her psychiatrist will not be available to see her in the future.   Follow Up Plan:  No further CSW needs and Patient will contact providers on list CSW mails her.  Patient is not in active treatment. Patient verbalizes understanding of plan: Yes    [employer], LCSW

## 2022-08-20 ENCOUNTER — Telehealth: Payer: Self-pay | Admitting: *Deleted

## 2022-08-20 NOTE — Telephone Encounter (Signed)
Call returned to patient. Left message on patient voice mail without saying her name that per Dr B it is up to the provider who orders her Nardil to decide if she should be taking it, but from his standpoint, he has no problem with her taking it

## 2022-08-20 NOTE — Telephone Encounter (Signed)
Patient called asking if she should still be taking phenelzine (NARDIL) 15 MG tablet  or not. Please advise.

## 2022-09-17 ENCOUNTER — Ambulatory Visit
Admission: RE | Admit: 2022-09-17 | Discharge: 2022-09-17 | Disposition: A | Discharge status: Home / Self Care | Payer: Medicare HMO | Source: Ambulatory Visit | Attending: Internal Medicine | Admitting: Internal Medicine

## 2022-09-17 DIAGNOSIS — D0512 Intraductal carcinoma in situ of left breast: Secondary | ICD-10-CM | POA: Diagnosis not present

## 2022-09-17 DIAGNOSIS — Z1231 Encounter for screening mammogram for malignant neoplasm of breast: Secondary | ICD-10-CM | POA: Diagnosis not present

## 2022-10-13 ENCOUNTER — Encounter: Payer: Self-pay | Admitting: Internal Medicine

## 2022-10-13 ENCOUNTER — Ambulatory Visit (INDEPENDENT_AMBULATORY_CARE_PROVIDER_SITE_OTHER): Payer: Medicare HMO | Admitting: Internal Medicine

## 2022-10-13 DIAGNOSIS — R69 Illness, unspecified: Secondary | ICD-10-CM | POA: Diagnosis not present

## 2022-10-13 DIAGNOSIS — M7918 Myalgia, other site: Secondary | ICD-10-CM | POA: Diagnosis not present

## 2022-10-13 DIAGNOSIS — F339 Major depressive disorder, recurrent, unspecified: Secondary | ICD-10-CM | POA: Diagnosis not present

## 2022-10-13 NOTE — Progress Notes (Signed)
Subjective:    Patient ID: Adrienne [name], female    DOB: 1941/03/02, 81 y.o.   MRN: [id number]  HPI Here due to buttock pain  Having pains in butt--on both sides Seems to be in the muscle Goes back some months No clear inciting factors. Not in bed Can feel it just sitting---not bad  Hasn't changed chairs, etc May notice slightly when walking--not bad  Recently had to put down 75 year old dog--about a year ago No longer walking the mile twice a day  Was kicked out of psychiatrist's office at last visit Told him [place]--and he kicked her out  Current Outpatient Medications on File Prior to Visit  Medication Sig Dispense Refill   acetaminophen (TYLENOL) 500 MG tablet Take 1,000 mg by mouth every 6 (six) hours as needed for moderate pain.     aspirin 81 MG EC tablet Take 81 mg by mouth daily.     Calcium Carbonate (CALCIUM 600 PO) Take 600 mg by mouth 2 (two) times daily.     Cholecalciferol (VITAMIN D3) 1000 units CAPS Take 1,000 Units by mouth daily.     Loperamide HCl (IMODIUM A-D PO) Take 2 mg by mouth daily.      LORazepam (ATIVAN) 0.5 MG tablet Take 0.5 mg by mouth at bedtime.     Multiple Vitamin (MULTIVITAMIN) tablet Take 1 tablet by mouth daily.     phenelzine (NARDIL) 15 MG tablet Take 30 mg by mouth 2 (two) times daily.     [DISCONTINUED] Omega-3-acid Ethyl Esters (LOVAZA PO) Take 2,000 mg by mouth 2 (two) times daily in the am and at bedtime..       No current facility-administered medications on file prior to visit.    Allergies  Allergen Reactions   Demerol [Meperidine]     Avoid Demerol due to patient taking MAOI drug.   Ivp Dye [Iodinated Contrast Media] Other (See Comments)    CARDIAC ARREST   Penicillins Anaphylaxis    Has patient had a PCN reaction causing immediate rash, facial/tongue/throat swelling, SOB or lightheadedness with hypotension: Yes Has patient had a PCN reaction causing severe rash involving mucus membranes or  skin necrosis: Yes Has patient had a PCN reaction that required hospitalization: No Has patient had a PCN reaction occurring within the last 10 years: Yes If all of the above answers are "NO", then may proceed with Cephalosporin use.    Metoclopramide Hcl Anxiety and Rash    Past Medical History:  Diagnosis Date   Anemia, unspecified    Bipolar affective disorder ([place])    Breast cancer ([place])    left   Bronchitis    Depression    Depressive disorder, not elsewhere classified    Disorder of bone and cartilage, unspecified    External hemorrhoids    Family history of breast cancer    Family history of pancreatic cancer    Family history of pancreatic cancer    Family history of prostate cancer    Genital herpes, unspecified    IBS (irritable bowel syndrome)    Lumbago    Macular degeneration    Personal history of radiation therapy 2009   lefet breast ca   Spinal stenosis, unspecified region other than cervical     Past Surgical History:  Procedure Laterality Date   ABDOMINAL HYSTERECTOMY     Total but cervix left   bilateral tuboplasty     BREAST LUMPECTOMY Left 6/09   Left; high grade  carcinoma in situ   CYSTOSCOPY WITH RETROGRADE PYELOGRAM, URETEROSCOPY AND STENT PLACEMENT Bilateral 10/07/2017   Procedure: RIGHT CYSTOSCOPY WITH BILATERAL RETROGRADE PYELOGRAM, URETEROSCOPY AND STENT PLACEMENT;  Surgeon: Ardis Hughs, MD;  Location: [facility];  Service: Urology;  Laterality: Bilateral;   DILATION AND CURETTAGE OF UTERUS     x 2   HOLMIUM LASER APPLICATION Right 62/83/1517   Procedure: HOLMIUM LASER APPLICATION;  Surgeon: Ardis Hughs, MD;  Location: [facility];  Service: Urology;  Laterality: Right;   MRI lumbar  05/2007   L4-L5 spinal stenosis   RECTOCELE REPAIR     TONSILLECTOMY      Family History  Problem Relation Age of Onset   Pancreatic cancer Father 85   Prostate cancer Father 17   Alzheimer's disease Father    Heart attack Father    Ulcerative  colitis Mother    Breast cancer Mother 62   Colitis Mother        ulcerative   Hypertension Brother    Hyperlipidemia Brother    Ulcerative colitis Daughter    Colitis Daughter        ulcerative   Breast cancer Paternal Grandmother 86   Colon cancer Neg Hx     Social History   Socioeconomic History   Marital status: Divorced    Spouse name: Not on file   Number of children: 1   Years of education: Not on file   Highest education level: Bachelor's degree (e.g., BA, AB, BS)  Occupational History   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  Tobacco Use   Smoking status: Never    Passive exposure: Never   Smokeless tobacco: Never  Vaping Use   Vaping Use: Never used  Substance and Sexual Activity   Alcohol use: Yes    Alcohol/week: 10.0 - 14.0 standard drinks of alcohol    Types: 10 - 14 Glasses of wine per week    Comment: 1 cocktail (manhattan) and 1 glass of wine daily   Drug use: No   Sexual activity: Not on file  Other Topics Concern   Not on file  Social History Narrative   Tumultuous preadolescence----mom very sick and she moved around to various relatives      Has living will   Daughter is health care POA-- no alternate (but probably brother)   Would accept resuscitation attempts   Probably wouldn't want tube feedings if cognitively aware   [facility] donation to [facility] already arranged         Pt lives alone with her dog, she has 1 child, right handed, patient drinks coffee 1 cup in morning, no tea, and some soda.   Social Determinants of Health   Financial Resource Strain: Low Risk  (08/19/2022)   Overall Financial Resource Strain (CARDIA)    Difficulty of Paying Living Expenses: Not hard at all  Food Insecurity: No Food Insecurity (08/19/2022)   Hunger Vital Sign    Worried About Running Out of Food in the Last Year: Never true    Ran Out of Food in the Last Year: Never true  Transportation Needs: No Transportation Needs (08/19/2022)    PRAPARE - Hydrologist (Medical): No    Lack of Transportation (Non-Medical): No  Physical Activity: Inactive (08/19/2022)   Exercise Vital Sign    Days of Exercise per Week: 0 days    Minutes of Exercise per Session: 0 min  Stress: Stress Concern Present (08/19/2022)  [place] Questionnaire    Feeling of Stress : To some extent  Social Connections: Socially Isolated (08/19/2022)   Social Connection and Isolation Panel [NHANES]    Frequency of Communication with Friends and Family: Three times a week    Frequency of Social Gatherings with Friends and Family: Three times a week    Attends Religious Services: Never    Active Member of Clubs or Organizations: No    Attends Archivist Meetings: Never    Marital Status: Divorced  Human resources officer Violence: Not At Risk (08/19/2022)   Humiliation, Afraid, Rape, and Kick questionnaire    Fear of Current or Ex-Partner: No    Emotionally Abused: No    Physically Abused: No    Sexually Abused: No   Review of Systems No change in bladder or bowel control No perineal sensation changes    Objective:   Physical Exam Constitutional:      Appearance: Normal appearance.  Musculoskeletal:     Comments: Pain area is in lower fleshy part of buttocks No spine tenderness Normal ROM in hips  Neurological:     Mental Status: She is alert.     Comments: Normal gait and strength in hips/legs/feet            Assessment & Plan:

## 2022-10-13 NOTE — Assessment & Plan Note (Signed)
Still having a hard time since losing dog--discussed getting an older rescue Continues on nardil--I can prescribe till she can find a replacement psychiatrist

## 2022-10-13 NOTE — Assessment & Plan Note (Signed)
Mild and seems to be just in soft tissue Long standing but not progressive No worrisome findings for spine or hips Discussed getting back to walking regularly

## 2022-11-04 ENCOUNTER — Ambulatory Visit: Payer: Medicare HMO | Admitting: Internal Medicine

## 2022-11-04 ENCOUNTER — Telehealth: Payer: Self-pay | Admitting: Internal Medicine

## 2022-11-04 NOTE — Telephone Encounter (Signed)
Pt's daughter, [name], called asking could she chime into pt's visit on 11/05/22? Larene Beach suggested if the pt wanted to, to call [place] during ov. [name] stated pt doesn't have a cell phone. [name] is asking can she receive a callback @ [phone] with details of visit?

## 2022-11-05 ENCOUNTER — Encounter: Payer: Self-pay | Admitting: Internal Medicine

## 2022-11-05 ENCOUNTER — Ambulatory Visit (INDEPENDENT_AMBULATORY_CARE_PROVIDER_SITE_OTHER): Payer: Medicare HMO | Admitting: Internal Medicine

## 2022-11-05 VITALS — BP 128/70 | HR 98 | Temp 97.9°F | Ht 59.5 in | Wt 121.0 lb

## 2022-11-05 DIAGNOSIS — R21 Rash and other nonspecific skin eruption: Secondary | ICD-10-CM | POA: Insufficient documentation

## 2022-11-05 DIAGNOSIS — R32 Unspecified urinary incontinence: Secondary | ICD-10-CM | POA: Insufficient documentation

## 2022-11-05 DIAGNOSIS — R69 Illness, unspecified: Secondary | ICD-10-CM | POA: Diagnosis not present

## 2022-11-05 DIAGNOSIS — F339 Major depressive disorder, recurrent, unspecified: Secondary | ICD-10-CM | POA: Diagnosis not present

## 2022-11-05 DIAGNOSIS — N3944 Nocturnal enuresis: Secondary | ICD-10-CM

## 2022-11-05 LAB — POC URINALSYSI DIPSTICK (AUTOMATED)
Bilirubin, UA: NEGATIVE
Blood, UA: NEGATIVE
Glucose, UA: NEGATIVE
Ketones, UA: NEGATIVE
Leukocytes, UA: NEGATIVE
Nitrite, UA: NEGATIVE
Protein, UA: NEGATIVE
Spec Grav, UA: 1.015 (ref 1.010–1.025)
Urobilinogen, UA: 0.2 E.U./dL
pH, UA: 6.5 (ref 5.0–8.0)

## 2022-11-05 MED ORDER — TRIAMCINOLONE ACETONIDE 0.1 % EX CREA
1.0000 | TOPICAL_CREAM | Freq: Two times a day (BID) | CUTANEOUS | 1 refills | Status: AC | PRN
Start: 2022-11-05 — End: ?

## 2022-11-05 MED ORDER — ARIPIPRAZOLE 2 MG PO TABS
2.0000 mg | ORAL_TABLET | Freq: Every day | ORAL | 1 refills | Status: DC
Start: 2022-11-05 — End: 2022-11-07

## 2022-11-05 NOTE — Assessment & Plan Note (Signed)
Also noted skin problems on ears Not wax but bothers her----looks like seborrhea vs eczema on inside of pinna/tragus Will try TAC for this

## 2022-11-05 NOTE — Telephone Encounter (Signed)
Included her on speaker phone during the visit

## 2022-11-05 NOTE — Progress Notes (Signed)
Subjective:    Patient ID: Adrienne [name], female    DOB: 01-26-1941, 81 y.o.   MRN: [id number]  HPI Here due to worsening of depression Daughter [name] on speaker phone  Started 3 weeks ago---had been doing great for a long time Now--just doesn't want to do things---invited to do things at church and just won't go Feels the depression has worsened Consistent with medications  Lost dog about 2-3 months ago--that has been tough Wasn't able to find an open [employer]  Was "kicked out of [facility] office" the last visit  Nardil for many years May have been on lithium in past--she doesn't remember mania but daughter thinks she had that diagnosis and was hospitalized in the past Past nortriptyline---long ago Took "a whole slew" of SSRI--didn't help and side effects  Has had some urinary incontinence at night Daughter feels her cognition has changed some No dysuria No daytime symptoms--but more night frequency  Still hoping to move closer to her daughter [employer])  Current Outpatient Medications on File Prior to Visit  Medication Sig Dispense Refill   acetaminophen (TYLENOL) 500 MG tablet Take 1,000 mg by mouth every 6 (six) hours as needed for moderate pain.     aspirin 81 MG EC tablet Take 81 mg by mouth daily.     Calcium Carbonate (CALCIUM 600 PO) Take 600 mg by mouth 2 (two) times daily.     Cholecalciferol (VITAMIN D3) 1000 units CAPS Take 1,000 Units by mouth daily.     Loperamide HCl (IMODIUM A-D PO) Take 2 mg by mouth daily.      LORazepam (ATIVAN) 0.5 MG tablet Take 0.5 mg by mouth at bedtime.     Multiple Vitamin (MULTIVITAMIN) tablet Take 1 tablet by mouth daily.     phenelzine (NARDIL) 15 MG tablet Take 30 mg by mouth 2 (two) times daily.     [DISCONTINUED] Omega-3-acid Ethyl Esters (LOVAZA PO) Take 2,000 mg by mouth 2 (two) times daily in the am and at bedtime..       No current facility-administered medications on file prior to visit.    Allergies   Allergen Reactions   Demerol [Meperidine]     Avoid Demerol due to patient taking MAOI drug.   Ivp Dye [Iodinated Contrast Media] Other (See Comments)    CARDIAC ARREST   Penicillins Anaphylaxis    Has patient had a PCN reaction causing immediate rash, facial/tongue/throat swelling, SOB or lightheadedness with hypotension: Yes Has patient had a PCN reaction causing severe rash involving mucus membranes or skin necrosis: Yes Has patient had a PCN reaction that required hospitalization: No Has patient had a PCN reaction occurring within the last 10 years: Yes If all of the above answers are "NO", then may proceed with Cephalosporin use.    Metoclopramide Hcl Anxiety and Rash    Past Medical History:  Diagnosis Date   Anemia, unspecified    Bipolar affective disorder ([place])    Breast cancer ([facility])    left   Bronchitis    Depression    Depressive disorder, not elsewhere classified    Disorder of bone and cartilage, unspecified    External hemorrhoids    Family history of breast cancer    Family history of pancreatic cancer    Family history of pancreatic cancer    Family history of prostate cancer    Genital herpes, unspecified    IBS (irritable bowel syndrome)    Lumbago    Macular degeneration  Personal history of radiation therapy 2009   lefet breast ca   Spinal stenosis, unspecified region other than cervical     Past Surgical History:  Procedure Laterality Date   ABDOMINAL HYSTERECTOMY     Total but cervix left   bilateral tuboplasty     BREAST LUMPECTOMY Left 6/09   Left; high grade carcinoma in situ   CYSTOSCOPY WITH RETROGRADE PYELOGRAM, URETEROSCOPY AND STENT PLACEMENT Bilateral 10/07/2017   Procedure: RIGHT CYSTOSCOPY WITH BILATERAL RETROGRADE PYELOGRAM, URETEROSCOPY AND STENT PLACEMENT;  Surgeon: Ardis Hughs, MD;  Location: [facility];  Service: Urology;  Laterality: Bilateral;   DILATION AND CURETTAGE OF UTERUS     x 2   HOLMIUM LASER APPLICATION Right  83/38/2505   Procedure: HOLMIUM LASER APPLICATION;  Surgeon: Ardis Hughs, MD;  Location: [facility];  Service: Urology;  Laterality: Right;   MRI lumbar  05/2007   L4-L5 spinal stenosis   RECTOCELE REPAIR     TONSILLECTOMY      Family History  Problem Relation Age of Onset   Pancreatic cancer Father 23   Prostate cancer Father 68   Alzheimer's disease Father    Heart attack Father    Ulcerative colitis Mother    Breast cancer Mother 81   Colitis Mother        ulcerative   Hypertension Brother    Hyperlipidemia Brother    Ulcerative colitis Daughter    Colitis Daughter        ulcerative   Breast cancer Paternal Grandmother 56   Colon cancer Neg Hx     Social History   Socioeconomic History   Marital status: Divorced    Spouse name: Not on file   Number of children: 1   Years of education: Not on file   Highest education level: Bachelor's degree (e.g., BA, AB, BS)  Occupational History   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  Tobacco Use   Smoking status: Never    Passive exposure: Never   Smokeless tobacco: Never  Vaping Use   Vaping Use: Never used  Substance and Sexual Activity   Alcohol use: Yes    Alcohol/week: 10.0 - 14.0 standard drinks of alcohol    Types: 10 - 14 Glasses of wine per week    Comment: 1 cocktail (manhattan) and 1 glass of wine daily   Drug use: No   Sexual activity: Not on file  Other Topics Concern   Not on file  Social History Narrative   Tumultuous preadolescence----mom very sick and she moved around to various relatives      Has living will   Daughter is health care POA-- no alternate (but probably brother)   Would accept resuscitation attempts   Probably wouldn't want tube feedings if cognitively aware   [facility] donation to [facility] already arranged         Pt lives alone with her dog, she has 1 child, right handed, patient drinks coffee 1 cup in morning, no tea, and some soda.   Social Determinants of  Health   Financial Resource Strain: Low Risk  (08/19/2022)   Overall Financial Resource Strain (CARDIA)    Difficulty of Paying Living Expenses: Not hard at all  Food Insecurity: No Food Insecurity (08/19/2022)   Hunger Vital Sign    Worried About Running Out of Food in the Last Year: Never true    Ran Out of Food in the Last Year: Never true  Transportation Needs: No Transportation  Needs (08/19/2022)   PRAPARE - Hydrologist (Medical): No    Lack of Transportation (Non-Medical): No  Physical Activity: Inactive (08/19/2022)   Exercise Vital Sign    Days of Exercise per Week: 0 days    Minutes of Exercise per Session: 0 min  Stress: Stress Concern Present (08/19/2022)   [place]    Feeling of Stress : To some extent  Social Connections: Socially Isolated (08/19/2022)   Social Connection and Isolation Panel [NHANES]    Frequency of Communication with Friends and Family: Three times a week    Frequency of Social Gatherings with Friends and Family: Three times a week    Attends Religious Services: Never    Active Member of Clubs or Organizations: No    Attends Archivist Meetings: Never    Marital Status: Divorced  Human resources officer Violence: Not At Risk (08/19/2022)   Humiliation, Afraid, Rape, and Kick questionnaire    Fear of Current or Ex-Partner: No    Emotionally Abused: No    Physically Abused: No    Sexually Abused: No   Review of Systems Sleeps okay--uses lorazepam just episodically No abdominal pain No N/V Eating okay--but appetite down some.  Has lost 20# over the past couple of years    Objective:   Physical Exam Constitutional:      Appearance: Normal appearance.  Neurological:     Mental Status: She is alert.  Psychiatric:     Comments: Some sadness Appropriate affect             Assessment & Plan:

## 2022-11-05 NOTE — Assessment & Plan Note (Addendum)
Has had complicated past history----which daughter helps clarify Likely was bipolar Didn't do well with SSRI agents Did well with nardil for many years but now exacerbated (loneliness, loss of dog, etc) Unclear if she might be having cognitive side effects with nardil  Will try adding low dose aripiprazole to help sleep and hopefully the depression Continue nardil for now Refer for psychiatry

## 2022-11-05 NOTE — Assessment & Plan Note (Addendum)
Urinalysis normal No symptoms to suggest infection Will try the aripiprazole '2mg'$  to help sleep

## 2022-11-05 NOTE — Addendum Note (Signed)
Addended by: Pilar Grammes on: 11/05/2022 11:05 AM   Modules accepted: Orders

## 2022-11-07 ENCOUNTER — Telehealth: Payer: Self-pay | Admitting: Internal Medicine

## 2022-11-07 MED ORDER — ARIPIPRAZOLE 2 MG PO TABS: 2.0000 mg | ORAL_TABLET | Freq: Every day | ORAL | 1 refills | Status: DC

## 2022-11-07 NOTE — Telephone Encounter (Signed)
Called pt. With a GoodRx card, she can get it for $20 at [facility]. I sent the rx to [facility] in [place] with a note that she will be using a GoodRx card. Took a card up front for her to pickup.

## 2022-11-07 NOTE — Telephone Encounter (Signed)
Patient called in and stated that the medication ARIPiprazole (ABILIFY) 2 MG tablet  is going to cost her $110 a month with her insurance. She isn't sure if there something else or not. She can be reached at [phone]. Thank you!

## 2022-11-07 NOTE — Addendum Note (Signed)
Addended by: Pilar Grammes on: 11/07/2022 03:56 PM   Modules accepted: Orders

## 2022-11-10 ENCOUNTER — Telehealth: Payer: Self-pay | Admitting: Internal Medicine

## 2022-11-10 NOTE — Telephone Encounter (Signed)
Patient called in and also wanted to know if she can still take the phenelzine (NARDIL) 15 MG tablet with the new medication and if she can take .5 MG LORazepam (ATIVAN) 0.5 MG tablet to go to sleep. She stated that he asked if she fell, and she stated no but she remember she did fall on her knees but they are healing (minor). Please advise. Thank you!

## 2022-11-10 NOTE — Telephone Encounter (Signed)
Patient called and stated she started the medication ARIPiprazole (ABILIFY) 2 MG tablet On Saturday and she felt much more energized and less depressed and she was told to call to tell the Doctor and wanted to know what she should do either make a appointment or see someone or keep using the medication.

## 2022-11-10 NOTE — Telephone Encounter (Signed)
Spoke to pt. Said the Abilify seemed to work right away.

## 2022-11-13 DIAGNOSIS — Z803 Family history of malignant neoplasm of breast: Secondary | ICD-10-CM | POA: Diagnosis not present

## 2022-11-13 DIAGNOSIS — Z9181 History of falling: Secondary | ICD-10-CM | POA: Diagnosis not present

## 2022-11-13 DIAGNOSIS — Z880 Allergy status to penicillin: Secondary | ICD-10-CM | POA: Diagnosis not present

## 2022-11-13 DIAGNOSIS — R32 Unspecified urinary incontinence: Secondary | ICD-10-CM | POA: Diagnosis not present

## 2022-11-13 DIAGNOSIS — R030 Elevated blood-pressure reading, without diagnosis of hypertension: Secondary | ICD-10-CM | POA: Diagnosis not present

## 2022-11-13 DIAGNOSIS — Z818 Family history of other mental and behavioral disorders: Secondary | ICD-10-CM | POA: Diagnosis not present

## 2022-11-13 DIAGNOSIS — Z85828 Personal history of other malignant neoplasm of skin: Secondary | ICD-10-CM | POA: Diagnosis not present

## 2022-11-13 DIAGNOSIS — R69 Illness, unspecified: Secondary | ICD-10-CM | POA: Diagnosis not present

## 2022-11-13 DIAGNOSIS — F419 Anxiety disorder, unspecified: Secondary | ICD-10-CM | POA: Diagnosis not present

## 2022-11-13 DIAGNOSIS — Z853 Personal history of malignant neoplasm of breast: Secondary | ICD-10-CM | POA: Diagnosis not present

## 2022-11-22 ENCOUNTER — Encounter: Payer: Self-pay | Admitting: Emergency Medicine

## 2022-11-22 ENCOUNTER — Emergency Department
Admission: EM | Admit: 2022-11-22 | Discharge: 2022-11-22 | Disposition: A | Discharge status: Home / Self Care | Payer: Medicare HMO | Attending: Emergency Medicine | Admitting: Emergency Medicine

## 2022-11-22 ENCOUNTER — Other Ambulatory Visit: Payer: Self-pay

## 2022-11-22 DIAGNOSIS — R2232 Localized swelling, mass and lump, left upper limb: Secondary | ICD-10-CM | POA: Diagnosis present

## 2022-11-22 DIAGNOSIS — L03012 Cellulitis of left finger: Secondary | ICD-10-CM | POA: Insufficient documentation

## 2022-11-22 MED ORDER — SULFAMETHOXAZOLE-TRIMETHOPRIM 800-160 MG PO TABS
1.0000 | ORAL_TABLET | Freq: Once | ORAL | Status: AC
  Administered 2022-11-22: 1 via ORAL
  Filled 2022-11-22: qty 1

## 2022-11-22 MED ORDER — SULFAMETHOXAZOLE-TRIMETHOPRIM 800-160 MG PO TABS: 1.0000 | ORAL_TABLET | Freq: Two times a day (BID) | ORAL | 0 refills | Status: DC

## 2022-11-22 MED ORDER — LIDOCAINE HCL (PF) 1 % IJ SOLN
5.0000 mL | Freq: Once | INTRAMUSCULAR | Status: AC
  Administered 2022-11-22: 5 mL
  Filled 2022-11-22: qty 5

## 2022-11-22 NOTE — ED Triage Notes (Signed)
Patient arrives ambulatory by POV c/o left middle finger infection. Patient reports having a hangnail last week and yesterday began having swelling and redness yesterday.

## 2022-11-22 NOTE — Discharge Instructions (Addendum)
Keep the wound clean, dry, and covered. Use warm (epsom) salt soaks to promote healing. Take the antibiotic until all pills are gone. Follow-up with Dr. Silvio Pate or this ED for worsening symptoms.

## 2022-11-22 NOTE — ED Provider Notes (Signed)
[facility] Emergency Department Provider Note     Event Date/Time   First MD Initiated Contact with Patient 11/22/22 1856     (approximate)   History   Finger Infection   HPI  Adrienne [name] is a 81 y.o. female presents to the ED for evaluation of infection to the left middle finger. She admits to tearing off her hangnail last week.   Physical Exam   Triage Vital Signs: ED Triage Vitals  Enc Vitals Group     BP 11/22/22 1828 (!) 156/88     Pulse Rate 11/22/22 1828 95     Resp 11/22/22 1828 18     Temp 11/22/22 1828 98.1 F (36.7 C)     Temp Source 11/22/22 1828 Oral     SpO2 11/22/22 1828 95 %     Weight 11/22/22 1835 121 lb (54.9 kg)     Height 11/22/22 1835 4' 11.5" (1.511 m)     Head Circumference --      Peak Flow --      Pain Score 11/22/22 1835 4     Pain Loc --      Pain Edu? --      Excl. in [place]? --     Most recent vital signs: Vitals:   11/22/22 1828 11/22/22 2022  BP: (!) 156/88 (!) 149/81  Pulse: 95 79  Resp: 18 16  Temp: 98.1 F (36.7 C)   SpO2: 95% 97%    General Awake, no distress. NAD CV:  Good peripheral perfusion.  RESP:  Normal effort.  ABD:  No distention.  SKIN:  Left middle finger with a paronychia noted. Erythema and edema noted to the distal fingertip.    ED Results / Procedures / Treatments   Labs (all labs ordered are listed, but only abnormal results are displayed) Labs Reviewed - No data to display   EKG   RADIOLOGY   No results found.   PROCEDURES:  Critical Care performed: No  ..Incision and Drainage  Date/Time: 11/22/2022 7:08 PM  Performed by: Melvenia Needles, PA-C Authorized by: Melvenia Needles, PA-C   Consent:    Consent obtained:  Verbal   Consent given by:  Patient   Risks discussed:  Bleeding, infection, incomplete drainage and pain   Alternatives discussed:  Alternative treatment, delayed treatment and observation Universal protocol:    Patient  identity confirmed:  Verbally with patient Location:    Type:  Abscess (paronychia)   Size:  1   Location:  Upper extremity   Upper extremity location:  Finger   Finger location:  L long finger Pre-procedure details:    Skin preparation:  Povidone-iodine Sedation:    Sedation type:  None Anesthesia:    Anesthesia method:  None Procedure type:    Complexity:  Simple Procedure details:    Ultrasound guidance: no     Needle aspiration: no     Incision types:  Single straight   Incision depth:  Dermal   Wound management:  Irrigated with saline   Drainage:  Purulent   Drainage amount:  Moderate   Wound treatment:  Wound left open   Packing materials:  None Post-procedure details:    Procedure completion:  Tolerated well, no immediate complications    MEDICATIONS ORDERED IN ED: Medications  lidocaine (PF) (XYLOCAINE) 1 % injection 5 mL (5 mLs Infiltration Given by Other 11/22/22 1919)  sulfamethoxazole-trimethoprim (BACTRIM DS) 800-160 MG per tablet 1 tablet (1 tablet Oral  Given 11/22/22 1919)     IMPRESSION / MDM / ASSESSMENT AND PLAN / ED COURSE  I reviewed the triage vital signs and the nursing notes.                              Differential diagnosis includes, but is not limited to, fingertip infection, SUH, paronychia  Patient's presentation is most consistent with acute, uncomplicated illness.  Patient's diagnosis is consistent with a paronychia.  Patient consented to local I&D procedure, which was performed with good expression of a moderate amount of purulent material from the cuticle.  Patient will be discharged home with home care instructions and prescriptions for Bactrim. Patient is to follow up with primary provider as needed or otherwise directed. Patient is given ED precautions to return to the ED for any worsening or new symptoms.     FINAL CLINICAL IMPRESSION(S) / ED DIAGNOSES   Final diagnoses:  Paronychia of finger of left hand     Rx / DC Orders    ED Discharge Orders          Ordered    sulfamethoxazole-trimethoprim (BACTRIM DS) 800-160 MG tablet  2 times daily        11/22/22 1916             Note:  This document was prepared using Dragon voice recognition software and may include unintentional dictation errors.    Melvenia Needles, PA-C 11/23/22 1529    Carrie Mew, MD 11/23/22 [phone]

## 2022-11-24 ENCOUNTER — Telehealth: Payer: Self-pay | Admitting: Internal Medicine

## 2022-11-24 NOTE — Telephone Encounter (Signed)
Pt called in wants to know if she should stop taking RX phenelzine (NARDIL) 15 MG tablet  with other medication . Please advise [phone]

## 2022-11-24 NOTE — Telephone Encounter (Signed)
Pt called in requesting a call back regarding new medication #  [phone]

## 2022-11-24 NOTE — Telephone Encounter (Signed)
Spoke to pt. She said her finger is doing well.   She said the Abilify has helped so much. Worked within a week. Said she feels like a new person!!   Almost out of the medication and asking a refill. Send to [facility].   Also, in the last several months, has been having increased nocturia. No bladder control issues. Asking if she should be concerned.

## 2022-11-24 NOTE — Telephone Encounter (Signed)
Spoke to pt. She is scheduled for 1 month f./u 12-05-22.

## 2022-11-26 ENCOUNTER — Telehealth: Payer: Self-pay | Admitting: Internal Medicine

## 2022-11-26 NOTE — Telephone Encounter (Signed)
Pt daughter [name] called in to schedule appointment for pt stated she is have reactions to new medication seeing people that are not there wants to know if pt should stop taking medication . Please advise # [phone]

## 2022-11-26 NOTE — Telephone Encounter (Signed)
Left detailed message on VM for pt about appt tomorrow with Dr Silvio Pate.

## 2022-11-26 NOTE — Telephone Encounter (Signed)
Spoke to pt's daughter. She was telling me stories that the pt called her at 130 am that she had house guests but they are not talking to her. She called her daughter again this am and she said she still had someone sitting with her. Said she told her daughter she read an entire book. Talking about random things.   Daughter made pt an appt tomorrow morning. She will advise her of the appt and to stop the Abilify. I will call her later today.

## 2022-11-27 ENCOUNTER — Encounter: Payer: Self-pay | Admitting: Internal Medicine

## 2022-11-27 ENCOUNTER — Ambulatory Visit (INDEPENDENT_AMBULATORY_CARE_PROVIDER_SITE_OTHER): Payer: Medicare HMO | Admitting: Internal Medicine

## 2022-11-27 ENCOUNTER — Telehealth: Payer: Self-pay | Admitting: Internal Medicine

## 2022-11-27 VITALS — BP 142/78 | HR 100 | Temp 98.0°F | Ht 59.5 in | Wt 120.0 lb

## 2022-11-27 DIAGNOSIS — R69 Illness, unspecified: Secondary | ICD-10-CM | POA: Diagnosis not present

## 2022-11-27 DIAGNOSIS — F339 Major depressive disorder, recurrent, unspecified: Secondary | ICD-10-CM

## 2022-11-27 NOTE — Telephone Encounter (Signed)
error 

## 2022-11-27 NOTE — Telephone Encounter (Signed)
I just spoke to the pt and she told me her daughter was going in and she is very excited. She said she would here until the 5th. Should we move her appointment up?

## 2022-11-27 NOTE — Telephone Encounter (Signed)
Kendra with [facility] called for Adrienne [name] pt appt was rescheduled for 12/16/22 due to a cancellation and this is the first available appt. Tillie Rung will contact pt with appt information.

## 2022-11-27 NOTE — Telephone Encounter (Signed)
Pt's daughter, [name] called asking for status of the pt's referral to the physiatrist? [name] stated she spoke to As[place] earlier regarding the referral. Call back # [phone]

## 2022-11-27 NOTE — Patient Instructions (Signed)
Please stop the aripiprazole. I have put in a referral to try to get you a new psychiatrist as soon as possible.

## 2022-11-27 NOTE — Assessment & Plan Note (Addendum)
Really now seems to be hypomanic States she feels better since on abilify---but seems to have intrusive dreams and ???hallucinations Will stop the abilify Urgent psychiatric appt PC with daughter Adrienne [name] to explain plan

## 2022-11-27 NOTE — Telephone Encounter (Signed)
Pt's daughter, [name] stating she is coming into town tomorrow, 11/28/22 & that the pt is scheduled to see letvak again on 12/05/22. [name] asked did letvak want to see pt sooner than next Friday, so she can attend the ov with the pt, due to the episode she had earlier today, 11/27/22, during ov? Call back # [phone]

## 2022-11-27 NOTE — Telephone Encounter (Signed)
Spoke to pt's daughter and moved the appt to 12-01-22 at 945.
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
## 2022-11-28 NOTE — Telephone Encounter (Signed)
That is a little better at least---I will be seeing her next week with her daughter

## 2022-12-01 ENCOUNTER — Ambulatory Visit (INDEPENDENT_AMBULATORY_CARE_PROVIDER_SITE_OTHER): Payer: Medicare HMO | Admitting: Internal Medicine

## 2022-12-01 ENCOUNTER — Encounter: Payer: Self-pay | Admitting: Internal Medicine

## 2022-12-01 VITALS — BP 128/78 | HR 96 | Temp 98.1°F | Ht 59.5 in | Wt 120.0 lb

## 2022-12-01 DIAGNOSIS — R69 Illness, unspecified: Secondary | ICD-10-CM | POA: Diagnosis not present

## 2022-12-01 DIAGNOSIS — F339 Major depressive disorder, recurrent, unspecified: Secondary | ICD-10-CM
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
## 2022-12-01 NOTE — Assessment & Plan Note (Signed)
Manic reaction with aripiprazole Really seems to be underlying bipolar disorder Current depression seemed to be triggered by the death of her dog (and loneliness, etc) Will just continue the nardil Await the psychiatry appointment Plans finally to make the move to [place] to be closer to daughter

## 2022-12-05 ENCOUNTER — Ambulatory Visit: Payer: Medicare HMO | Admitting: Internal Medicine
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
## 2022-12-16 ENCOUNTER — Encounter: Payer: Self-pay | Admitting: Psychiatry

## 2022-12-16 ENCOUNTER — Ambulatory Visit (INDEPENDENT_AMBULATORY_CARE_PROVIDER_SITE_OTHER): Payer: Medicare HMO | Admitting: Psychiatry

## 2022-12-16 VITALS — BP 147/72 | HR 99 | Temp 98.1°F | Ht 59.5 in | Wt 122.8 lb

## 2022-12-16 DIAGNOSIS — G4700 Insomnia, unspecified: Secondary | ICD-10-CM

## 2022-12-16 DIAGNOSIS — F0630 Mood disorder due to known physiological condition, unspecified: Secondary | ICD-10-CM | POA: Diagnosis not present

## 2022-12-18 ENCOUNTER — Telehealth: Payer: Self-pay | Admitting: Psychiatry

## 2022-12-18 DIAGNOSIS — J1189 Influenza due to unidentified influenza virus with other manifestations: Secondary | ICD-10-CM | POA: Diagnosis not present

## 2022-12-18 NOTE — Telephone Encounter (Addendum)
Reviewed notes from DR. Plovsky Most recent notee includes 06/2022. Diagnosis- MDD recurrent, mid. MMSE 27 02/2022.  Medication- Phenelzine 30 mg twice a day, lorazepam 0.5 mg qhs.  No documentation about history of bipolar disorder, or trials of medication in the note.

## 2022-12-26 ENCOUNTER — Ambulatory Visit: Payer: Medicare HMO | Admitting: Internal Medicine

## 2023-01-12 ENCOUNTER — Ambulatory Visit: Payer: Medicare HMO | Admitting: Psychiatry

## 2023-01-15 ENCOUNTER — Encounter: Payer: Self-pay | Admitting: Internal Medicine

## 2023-01-15 ENCOUNTER — Ambulatory Visit (INDEPENDENT_AMBULATORY_CARE_PROVIDER_SITE_OTHER): Payer: Medicare HMO | Admitting: Internal Medicine

## 2023-01-15 VITALS — BP 124/80 | HR 93 | Temp 97.7°F | Ht 60.0 in | Wt 124.0 lb

## 2023-01-15 DIAGNOSIS — M19041 Primary osteoarthritis, right hand: Secondary | ICD-10-CM

## 2023-01-15 DIAGNOSIS — M19042 Primary osteoarthritis, left hand: Secondary | ICD-10-CM

## 2023-01-15 DIAGNOSIS — Z0000 Encounter for general adult medical examination without abnormal findings: Secondary | ICD-10-CM

## 2023-01-15 DIAGNOSIS — F339 Major depressive disorder, recurrent, unspecified: Secondary | ICD-10-CM | POA: Diagnosis not present

## 2023-01-15 DIAGNOSIS — R69 Illness, unspecified: Secondary | ICD-10-CM | POA: Diagnosis not present

## 2023-01-15 DIAGNOSIS — G629 Polyneuropathy, unspecified: Secondary | ICD-10-CM | POA: Diagnosis not present

## 2023-01-15 DIAGNOSIS — I700 Atherosclerosis of aorta: Secondary | ICD-10-CM | POA: Diagnosis not present

## 2023-01-15 LAB — COMPREHENSIVE METABOLIC PANEL
ALT: 12 U/L (ref 0–35)
AST: 23 U/L (ref 0–37)
Albumin: 4.2 g/dL (ref 3.5–5.2)
Alkaline Phosphatase: 59 U/L (ref 39–117)
BUN: 13 mg/dL (ref 6–23)
CO2: 33 mEq/L — ABNORMAL HIGH (ref 19–32)
Calcium: 9.8 mg/dL (ref 8.4–10.5)
Chloride: 101 mEq/L (ref 96–112)
Creatinine, Ser: 0.76 mg/dL (ref 0.40–1.20)
GFR: 74.53 mL/min (ref >60.00)
Glucose, Bld: 100 mg/dL — ABNORMAL HIGH (ref 70–99)
Potassium: 4.5 mEq/L (ref 3.5–5.1)
Sodium: 140 mEq/L (ref 135–145)
Total Bilirubin: 0.3 mg/dL (ref 0.2–1.2)
Total Protein: 7.3 g/dL (ref 6.0–8.3)

## 2023-01-15 LAB — CBC
HCT: 35 % — ABNORMAL LOW (ref 36.0–46.0)
Hemoglobin: 11.5 g/dL — ABNORMAL LOW (ref 12.0–15.0)
MCHC: 32.9 g/dL (ref 30.0–36.0)
MCV: 89.2 fl (ref 78.0–100.0)
Platelets: 276 10*3/uL (ref 150.0–400.0)
RBC: 3.92 Mil/uL (ref 3.87–5.11)
RDW: 13.3 % (ref 11.5–15.5)
WBC: 4.4 10*3/uL (ref 4.0–10.5)

## 2023-01-15 NOTE — Progress Notes (Signed)
Hearing Screening - Comments:: Passed whisper test Vision Screening - Comments:: January 2023. Appt today

## 2023-01-15 NOTE — Assessment & Plan Note (Signed)
I have personally reviewed the Medicare Annual Wellness questionnaire and have noted 1. The patient's medical and social history 2. Their use of alcohol, tobacco or illicit drugs 3. Their current medications and supplements 4. The patient's functional ability including ADL's, fall risks, home safety risks and hearing or visual             impairment. 5. Diet and physical activities 6. Evidence for depression or mood disorders  The patients weight, height, BMI and visual acuity have been recorded in the chart I have made referrals, counseling and provided education to the patient based review of the above and I have provided the pt with a written personalized care plan for preventive services.  I have provided you with a copy of your personalized plan for preventive services. Please take the time to review along with your updated medication list.   Prefers no more colonoscopies Yearly mammogram still due to past cancer Discussed restarting exercise Td and updated COVID at pharmacy

## 2023-01-15 NOTE — Assessment & Plan Note (Signed)
Question of bipolar diagnosis Now seeing new psychiatrist Continues on the nardil 30 bid Rare lorazepam at night

## 2023-01-15 NOTE — Assessment & Plan Note (Signed)
On imaging No statin per previous discussions

## 2023-01-15 NOTE — Progress Notes (Signed)
Subjective:    Patient ID: Adrienne [name], female    DOB: 1941/12/18, 82 y.o.   MRN: [id number]  HPI Here for Medicare wellness visit and follow up of chronic health conditions Reviewed advanced directives Reviewed other doctors---Dr Porfilio--ophthal, Dentist, Dr Hisada--psychiatry, Dr Maxie Barb, Dr Luvenia Starch No hospitalizations or surgery this year Not really exercising---formerly walked her dog (losing dog precipitated crisis) Vision is okay Hearing is fine 1 glass of wine with dinner No tobacco No falls Independent with instrumental ADLs No memory problems  Did see the new psychiatrist Depression is better--still some bad days (especially with cold weather) Still planning move to [place] to be near daughter (assisted living)  Known aortic atherosclerosis No vascular symptoms No chest pain or SOB No dizziness or syncope No edema No palpitations  Still with ulnar neuropathy Decided against surgery  Current Outpatient Medications on File Prior to Visit  Medication Sig Dispense Refill   acetaminophen (TYLENOL) 500 MG tablet Take 1,000 mg by mouth every 6 (six) hours as needed for moderate pain.     aspirin 81 MG EC tablet Take 81 mg by mouth daily.     Calcium Carbonate (CALCIUM 600 PO) Take 600 mg by mouth 2 (two) times daily.     Cholecalciferol (VITAMIN D3) 1000 units CAPS Take 1,000 Units by mouth daily.     Loperamide HCl (IMODIUM A-D PO) Take 2 mg by mouth daily.      LORazepam (ATIVAN) 0.5 MG tablet Take 0.5 mg by mouth at bedtime.     Multiple Vitamin (MULTIVITAMIN) tablet Take 1 tablet by mouth daily.     phenelzine (NARDIL) 15 MG tablet Take 30 mg by mouth 2 (two) times daily.     triamcinolone cream (KENALOG) 0.1 % Apply 1 Application topically 2 (two) times daily as needed. 45 g 1   [DISCONTINUED] Omega-3-acid Ethyl Esters (LOVAZA PO) Take 2,000 mg by mouth 2 (two) times daily in the am and at bedtime..       No current facility-administered  medications on file prior to visit.    Allergies  Allergen Reactions   Abilify [Aripiprazole] Other (See Comments)    Hallucinations/mania   Demerol [Meperidine]     Avoid Demerol due to patient taking MAOI drug.   Ivp Dye [Iodinated Contrast Media] Other (See Comments)    CARDIAC ARREST   Penicillins Anaphylaxis    Has patient had a PCN reaction causing immediate rash, facial/tongue/throat swelling, SOB or lightheadedness with hypotension: Yes Has patient had a PCN reaction causing severe rash involving mucus membranes or skin necrosis: Yes Has patient had a PCN reaction that required hospitalization: No Has patient had a PCN reaction occurring within the last 10 years: Yes If all of the above answers are "NO", then may proceed with Cephalosporin use.    Metoclopramide Hcl Anxiety and Rash    Past Medical History:  Diagnosis Date   Anemia, unspecified    Bipolar affective disorder ([place])    Breast cancer ([place])    left   Bronchitis    Depression    Depressive disorder, not elsewhere classified    Disorder of bone and cartilage, unspecified    External hemorrhoids    Family history of breast cancer    Family history of pancreatic cancer    Family history of pancreatic cancer    Family history of prostate cancer    Genital herpes, unspecified    IBS (irritable bowel syndrome)    Lumbago    Macular degeneration  Personal history of radiation therapy 2009   lefet breast ca   Spinal stenosis, unspecified region other than cervical     Past Surgical History:  Procedure Laterality Date   ABDOMINAL HYSTERECTOMY     Total but cervix left   bilateral tuboplasty     BREAST LUMPECTOMY Left 6/09   Left; high grade carcinoma in situ   CYSTOSCOPY WITH RETROGRADE PYELOGRAM, URETEROSCOPY AND STENT PLACEMENT Bilateral 10/07/2017   Procedure: RIGHT CYSTOSCOPY WITH BILATERAL RETROGRADE PYELOGRAM, URETEROSCOPY AND STENT PLACEMENT;  Surgeon: Ardis Hughs, MD;  Location: [facility];   Service: Urology;  Laterality: Bilateral;   DILATION AND CURETTAGE OF UTERUS     x 2   HOLMIUM LASER APPLICATION Right 17/40/8144   Procedure: HOLMIUM LASER APPLICATION;  Surgeon: Ardis Hughs, MD;  Location: [facility];  Service: Urology;  Laterality: Right;   MRI lumbar  05/2007   L4-L5 spinal stenosis   RECTOCELE REPAIR     TONSILLECTOMY      Family History  Problem Relation Age of Onset   Anxiety disorder Mother    Depression Mother    Ulcerative colitis Mother    Breast cancer Mother 68   Colitis Mother        ulcerative   Pancreatic cancer Father 59   Prostate cancer Father 45   Alzheimer's disease Father    Heart attack Father    Hypertension Brother    Hyperlipidemia Brother    Breast cancer Paternal Grandmother 62   Ulcerative colitis Daughter    Colitis Daughter        ulcerative   Colon cancer Neg Hx     Social History   Socioeconomic History   Marital status: Divorced    Spouse name: Not on file   Number of children: 1   Years of education: Not on file   Highest education level: Bachelor's degree (e.g., BA, AB, BS)  Occupational History   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  Tobacco Use   Smoking status: Never    Passive exposure: Never   Smokeless tobacco: Never  Vaping Use   Vaping Use: Never used  Substance and Sexual Activity   Alcohol use: Yes    Alcohol/week: 10.0 - 14.0 standard drinks of alcohol    Types: 10 - 14 Glasses of wine per week    Comment: 1 cocktail (manhattan) and 1 glass of wine daily   Drug use: No   Sexual activity: Not Currently  Other Topics Concern   Not on file  Social History Narrative   Tumultuous preadolescence----mom very sick and she moved around to various relatives      Has living will   Daughter is health care POA-- no alternate (but probably brother)   Would accept resuscitation attempts   Probably wouldn't want tube feedings if cognitively aware   [facility] to [facility]  already arranged         Pt lives alone with her dog, she has 1 child, right handed, patient drinks coffee 1 cup in morning, no tea, and some soda.   Social Determinants of Health   Financial Resource Strain: Low Risk  (08/19/2022)   Overall Financial Resource Strain (CARDIA)    Difficulty of Paying Living Expenses: Not hard at all  Food Insecurity: No Food Insecurity (08/19/2022)   Hunger Vital Sign    Worried About Running Out of Food in the Last Year: Never true    Ran Out of Food in  the Last Year: Never true  Transportation Needs: No Transportation Needs (08/19/2022)   PRAPARE - Hydrologist (Medical): No    Lack of Transportation (Non-Medical): No  Physical Activity: Inactive (08/19/2022)   Exercise Vital Sign    Days of Exercise per Week: 0 days    Minutes of Exercise per Session: 0 min  Stress: Stress Concern Present (08/19/2022)   [place]    Feeling of Stress : To some extent  Social Connections: Socially Isolated (08/19/2022)   Social Connection and Isolation Panel [NHANES]    Frequency of Communication with Friends and Family: Three times a week    Frequency of Social Gatherings with Friends and Family: Three times a week    Attends Religious Services: Never    Active Member of Clubs or Organizations: No    Attends Archivist Meetings: Never    Marital Status: Divorced  Human resources officer Violence: Not At Risk (08/19/2022)   Humiliation, Afraid, Rape, and Kick questionnaire    Fear of Current or Ex-Partner: No    Emotionally Abused: No    Physically Abused: No    Sexually Abused: No   Review of Systems Not eating as much--not cooking Weight down slightly Sleeping okay--occasional trouble initiating, then will use the lorazepam Wears seat belt Teeth are okay---keeps up with dentist Gets some fluid from her ears--white/flaky No suspicious skin lesions No heartburn  or dysphagia Bowels move fine--no blood Voids fine---some increased frequency. No incontinence No sig back or joint pain    Objective:   Physical Exam Constitutional:      Appearance: Normal appearance.  HENT:     Mouth/Throat:     Comments: No lesions Eyes:     Conjunctiva/sclera: Conjunctivae normal.     Pupils: Pupils are equal, round, and reactive to light.  Cardiovascular:     Rate and Rhythm: Normal rate and regular rhythm.     Pulses: Normal pulses.     Heart sounds: No murmur heard.    No gallop.  Pulmonary:     Effort: Pulmonary effort is normal.     Breath sounds: Normal breath sounds. No wheezing or rales.  Abdominal:     Palpations: Abdomen is soft.     Tenderness: There is no abdominal tenderness.  Musculoskeletal:     Cervical back: Neck supple.     Right lower leg: No edema.     Left lower leg: No edema.  Lymphadenopathy:     Cervical: No cervical adenopathy.  Skin:    Findings: No lesion or rash.  Neurological:     General: No focal deficit present.     Mental Status: She is alert and oriented to person, place, and time.     Comments: Word naming 6/1 minute Recall 2/3  Psychiatric:        Mood and Affect: Mood normal.        Behavior: Behavior normal.            Assessment & Plan:

## 2023-01-15 NOTE — Assessment & Plan Note (Signed)
Uses tylenol prn

## 2023-01-15 NOTE — Assessment & Plan Note (Signed)
Was considering hand surgery but is holding off

## 2023-01-21 ENCOUNTER — Telehealth: Payer: Self-pay | Admitting: Internal Medicine

## 2023-01-21 NOTE — Telephone Encounter (Signed)
Patient called and asked can she get a copy of her lab results. Call back number [phone].

## 2023-01-22 NOTE — Telephone Encounter (Signed)
Labs printed and mailed to pt as requested.

## 2023-01-28 ENCOUNTER — Encounter: Payer: Self-pay | Admitting: Internal Medicine

## 2023-01-28 ENCOUNTER — Ambulatory Visit (INDEPENDENT_AMBULATORY_CARE_PROVIDER_SITE_OTHER): Payer: Medicare HMO | Admitting: Internal Medicine

## 2023-01-28 VITALS — BP 124/68 | HR 97 | Temp 98.0°F | Ht 60.0 in | Wt 124.0 lb

## 2023-01-28 DIAGNOSIS — R151 Fecal smearing: Secondary | ICD-10-CM | POA: Diagnosis not present

## 2023-01-28 NOTE — Assessment & Plan Note (Signed)
Not new Some leakage mostly---pad is adequate Discussed that this can be annoying but not dangerous Asked her to stop the imodium and only use if sig diarrhea May benefit from fiber supplement

## 2023-01-28 NOTE — Patient Instructions (Signed)
Please stop the imodium----and only use if you have significant diarrhea. You can try a fiber supplement--like metamucil,fibercon, etc

## 2023-01-28 NOTE — Progress Notes (Signed)
Subjective:    Patient ID: Adrienne [name], female    DOB: Aug 01, 1941, 82 y.o.   MRN: [id number]  HPI Here due to discharge from rectum  Gets some brown rectal discharge Seems to be stool---wears pad Bowels are regular--- will go after her morning cup of coffee. Look normal No blood No abdominal pain Still taking daily imodium---though no more loose stools  Current Outpatient Medications on File Prior to Visit  Medication Sig Dispense Refill   acetaminophen (TYLENOL) 500 MG tablet Take 1,000 mg by mouth every 6 (six) hours as needed for moderate pain.     aspirin 81 MG EC tablet Take 81 mg by mouth daily.     Calcium Carbonate (CALCIUM 600 PO) Take 600 mg by mouth 2 (two) times daily.     Cholecalciferol (VITAMIN D3) 1000 units CAPS Take 1,000 Units by mouth daily.     Loperamide HCl (IMODIUM A-D PO) Take 2 mg by mouth daily.      LORazepam (ATIVAN) 0.5 MG tablet Take 0.5 mg by mouth at bedtime.     Multiple Vitamin (MULTIVITAMIN) tablet Take 1 tablet by mouth daily.     phenelzine (NARDIL) 15 MG tablet Take 30 mg by mouth 2 (two) times daily.     triamcinolone cream (KENALOG) 0.1 % Apply 1 Application topically 2 (two) times daily as needed. 45 g 1   [DISCONTINUED] Omega-3-acid Ethyl Esters (LOVAZA PO) Take 2,000 mg by mouth 2 (two) times daily in the am and at bedtime..       No current facility-administered medications on file prior to visit.    Allergies  Allergen Reactions   Abilify [Aripiprazole] Other (See Comments)    Hallucinations/mania   Demerol [Meperidine]     Avoid Demerol due to patient taking MAOI drug.   Ivp Dye [Iodinated Contrast Media] Other (See Comments)    CARDIAC ARREST   Penicillins Anaphylaxis    Has patient had a PCN reaction causing immediate rash, facial/tongue/throat swelling, SOB or lightheadedness with hypotension: Yes Has patient had a PCN reaction causing severe rash involving mucus membranes or skin necrosis: Yes Has patient had a PCN  reaction that required hospitalization: No Has patient had a PCN reaction occurring within the last 10 years: Yes If all of the above answers are "NO", then may proceed with Cephalosporin use.    Metoclopramide Hcl Anxiety and Rash    Past Medical History:  Diagnosis Date   Anemia, unspecified    Bipolar affective disorder ([place])    Breast cancer ([place])    left   Bronchitis    Depression    Depressive disorder, not elsewhere classified    Disorder of bone and cartilage, unspecified    External hemorrhoids    Family history of breast cancer    Family history of pancreatic cancer    Family history of pancreatic cancer    Family history of prostate cancer    Genital herpes, unspecified    IBS (irritable bowel syndrome)    Lumbago    Macular degeneration    Personal history of radiation therapy 2009   lefet breast ca   Spinal stenosis, unspecified region other than cervical     Past Surgical History:  Procedure Laterality Date   ABDOMINAL HYSTERECTOMY     Total but cervix left   bilateral tuboplasty     BREAST LUMPECTOMY Left 6/09   Left; high grade carcinoma in situ   CYSTOSCOPY WITH RETROGRADE PYELOGRAM, URETEROSCOPY AND STENT PLACEMENT Bilateral 10/07/2017  Procedure: RIGHT CYSTOSCOPY WITH BILATERAL RETROGRADE PYELOGRAM, URETEROSCOPY AND STENT PLACEMENT;  Surgeon: Ardis Hughs, MD;  Location: [facility];  Service: Urology;  Laterality: Bilateral;   DILATION AND CURETTAGE OF UTERUS     x 2   HOLMIUM LASER APPLICATION Right 67/34/1937   Procedure: HOLMIUM LASER APPLICATION;  Surgeon: Ardis Hughs, MD;  Location: [facility];  Service: Urology;  Laterality: Right;   MRI lumbar  05/2007   L4-L5 spinal stenosis   RECTOCELE REPAIR     TONSILLECTOMY      Family History  Problem Relation Age of Onset   Anxiety disorder Mother    Depression Mother    Ulcerative colitis Mother    Breast cancer Mother 37   Colitis Mother        ulcerative   Pancreatic cancer Father 13    Prostate cancer Father 67   Alzheimer's disease Father    Heart attack Father    Hypertension Brother    Hyperlipidemia Brother    Breast cancer Paternal Grandmother 24   Ulcerative colitis Daughter    Colitis Daughter        ulcerative   Colon cancer Neg Hx     Social History   Socioeconomic History   Marital status: Divorced    Spouse name: Not on file   Number of children: 1   Years of education: Not on file   Highest education level: Bachelor's degree (e.g., BA, AB, BS)  Occupational History   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  Tobacco Use   Smoking status: Never    Passive exposure: Never   Smokeless tobacco: Never  Vaping Use   Vaping Use: Never used  Substance and Sexual Activity   Alcohol use: Yes    Alcohol/week: 10.0 - 14.0 standard drinks of alcohol    Types: 10 - 14 Glasses of wine per week    Comment: 1 cocktail (manhattan) and 1 glass of wine daily   Drug use: No   Sexual activity: Not Currently  Other Topics Concern   Not on file  Social History Narrative   Tumultuous preadolescence----mom very sick and she moved around to various relatives      Has living will   Daughter is health care POA-- no alternate (but probably brother)   Would accept resuscitation attempts   Probably wouldn't want tube feedings if cognitively aware   Body donation to [facility] already arranged         Pt lives alone with her dog, she has 1 child, right handed, patient drinks coffee 1 cup in morning, no tea, and some soda.   Social Determinants of Health   Financial Resource Strain: Low Risk  (08/19/2022)   Overall Financial Resource Strain (CARDIA)    Difficulty of Paying Living Expenses: Not hard at all  Food Insecurity: No Food Insecurity (08/19/2022)   Hunger Vital Sign    Worried About Running Out of Food in the Last Year: Never true    Ran Out of Food in the Last Year: Never true  Transportation Needs: No Transportation Needs  (08/19/2022)   PRAPARE - Hydrologist (Medical): No    Lack of Transportation (Non-Medical): No  Physical Activity: Inactive (08/19/2022)   Exercise Vital Sign    Days of Exercise per Week: 0 days    Minutes of Exercise per Session: 0 min  Stress: Stress Concern Present (08/19/2022)   [place] -  Occupational Stress Questionnaire    Feeling of Stress : To some extent  Social Connections: Socially Isolated (08/19/2022)   Social Connection and Isolation Panel [NHANES]    Frequency of Communication with Friends and Family: Three times a week    Frequency of Social Gatherings with Friends and Family: Three times a week    Attends Religious Services: Never    Active Member of Clubs or Organizations: No    Attends Archivist Meetings: Never    Marital Status: Divorced  Human resources officer Violence: Not At Risk (08/19/2022)   Humiliation, Afraid, Rape, and Kick questionnaire    Fear of Current or Ex-Partner: No    Emotionally Abused: No    Physically Abused: No    Sexually Abused: No   Review of Systems Eating well Weight is stable Having stress---not sure she wants to move up to the [place] area by daughter (as had been planned)    Objective:   Physical Exam Constitutional:      Appearance: Normal appearance.  Abdominal:     General: There is no distension.     Palpations: Abdomen is soft.     Tenderness: There is no abdominal tenderness. There is no guarding.  Genitourinary:    Comments: Small circumferential non inflamed hemorrhoids Some tinged mucus leakage No mass or stool in vault Secretions heme negative  Neurological:     Mental Status: She is alert.            Assessment & Plan:

## 2023-02-04 NOTE — Progress Notes (Unsigned)
BH MD/PA/NP OP Progress Note  02/05/2023 2:14 PM Adrienne [name]  MRN:  [id number]  Chief Complaint:  Chief Complaint  Patient presents with   Follow-up   HPI:  This is a follow-up appointment for depression.  She states that she has been doing "so-so."  She is considering to move to assisted living facility in [place].  It will likely be on and in June.  She has been trying to get rid of clothes.  She states that she does not have friends in the area, and she missed this.  She has no one she can talk to. (However, she later states that she has friends to go out to dinner every week).  She became almost tearful, expressing how much she missed her deceased dog.  She thinks she has been feeling better than the last time she is here.  She is taking a course for older adults at [facility] every week.  She tries to sit in the front as she does not want to miss anything.  She states that she has been on for phenelzine for many years.  She does not think medication is helping.  She takes Ativan at night for sleep.  Despite the instruction to avoid certain foods and drinks, she continues to drink 1/2 glass of wine or a cocktail every day.  She likes to eat cheddar cheese.  She adamant not to stop these as she has been doing fine with the current medication for many years.  Although she occasionally wonders if it is better to be off, she adamantly denied any plan or intent.  She denies hallucinations.  She denies decreased need for sleep or euphonia. She states that she was kicked ou by Dr. Casimiro Needle as he did not like that she will be moving to [place].     Functional Status Instrumental Activities of Daily Living (IADLs):  Adrienne [name] is independent in the following: managing finances, medications, driving Requires assistance with the following:   Activities of Daily Living (ADLs):  Adrienne [name] is independent in the following: bathing and hygiene, feeding, continence, grooming and toileting,  walking      Wt Readings from Last 3 Encounters:  02/05/23 120 lb 6.4 oz (54.6 kg)  01/28/23 124 lb (56.2 kg)  01/15/23 124 lb (56.2 kg)     Support: daughter Household: by herself Marital status: divorced, married twice  Number of children: 1 daughter (high school Pharmacist, hospital) Employment: retired, used to work at [employer], Production assistant, radio,  Education:  [employer], BS in [place] PCP / ongoing medical evaluation:  She grew up in [place].  She went to [place] and moved to [place] 15 years ago due to her husband's work.  She states that her mother suffered from depression, and had ileostomy due to UC. She felt like [name] was "adult" and her mother was a "child." Her mother was "smothering."  She reports good support from her father, who "would do anything for kids."    Visit Diagnosis:    ICD-10-CM   1. MDD (major depressive disorder), recurrent episode, mild ([place])  F33.0     2. Insomnia, unspecified type  G47.00       Past Psychiatric History: Please see initial evaluation for full details. I have reviewed the history. No updates at this time.     Past Medical History:  Past Medical History:  Diagnosis Date   Anemia, unspecified    Bipolar affective disorder ([place])  Breast cancer ([place])    left   Bronchitis    Depression    Depressive disorder, not elsewhere classified    Disorder of bone and cartilage, unspecified    External hemorrhoids    Family history of breast cancer    Family history of pancreatic cancer    Family history of pancreatic cancer    Family history of prostate cancer    Genital herpes, unspecified    IBS (irritable bowel syndrome)    Lumbago    Macular degeneration    Personal history of radiation therapy 2009   lefet breast ca   Spinal stenosis, unspecified region other than cervical     Past Surgical History:  Procedure Laterality Date   ABDOMINAL HYSTERECTOMY     Total but cervix left   bilateral tuboplasty     BREAST  LUMPECTOMY Left 6/09   Left; high grade carcinoma in situ   CYSTOSCOPY WITH RETROGRADE PYELOGRAM, URETEROSCOPY AND STENT PLACEMENT Bilateral 10/07/2017   Procedure: RIGHT CYSTOSCOPY [place], URETEROSCOPY AND STENT PLACEMENT;  Surgeon: Ardis Hughs, MD;  [facility];  Service: Urology;  Laterality: Bilateral;   DILATION AND CURETTAGE OF UTERUS     x 2   HOLMIUM LASER APPLICATION Right 02/77/4128   Procedure: HOLMIUM LASER APPLICATION;  Surgeon: Ardis Hughs, MD;  [facility];  Service: Urology;  Laterality: Right;   MRI lumbar  05/2007   L4-L5 spinal stenosis   RECTOCELE REPAIR     TONSILLECTOMY      Family Psychiatric History: Please see initial evaluation for full details. I have reviewed the history. No updates at this time.     Family History:  Family History  Problem Relation Age of Onset   Anxiety disorder Mother    Depression Mother    Ulcerative colitis Mother    Breast cancer Mother 16   Colitis Mother        ulcerative   Pancreatic cancer Father 41   Prostate cancer Father 83   Alzheimer's disease Father    Heart attack Father    Hypertension Brother    Hyperlipidemia Brother    Breast cancer Paternal Grandmother 24   Ulcerative colitis Daughter    Colitis Daughter        ulcerative   Colon cancer Neg Hx     Social History:  Social History   Socioeconomic History   Marital status: Divorced    Spouse name: Not on file   Number of children: 1   Years of education: Not on file   Highest education level: Bachelor's degree (e.g., BA, AB, BS)  Occupational History   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  Tobacco Use   Smoking status: Never    Passive exposure: Never   Smokeless tobacco: Never  Vaping Use   Vaping Use: Never used  Substance and Sexual Activity   Alcohol use: Yes    Alcohol/week: 10.0 - 14.0 standard drinks of alcohol    Types: 10 - 14 Glasses of wine per week     Comment: 1 cocktail (manhattan) and 1 glass of wine daily   Drug use: No   Sexual activity: Not Currently  Other Topics Concern   Not on file  Social History Narrative   Tumultuous preadolescence----mom very sick and she moved around to various relatives      Has living will   Daughter is health care POA-- no alternate (but probably brother)  Would accept resuscitation attempts   Probably wouldn't want tube feedings if cognitively aware   [facility] donation to [facility] already arranged         Pt lives alone with her dog, she has 1 child, right handed, patient drinks coffee 1 cup in morning, no tea, and some soda.   Social Determinants of Health   Financial Resource Strain: Low Risk  (08/19/2022)   Overall Financial Resource Strain (CARDIA)    Difficulty of Paying Living Expenses: Not hard at all  Food Insecurity: No Food Insecurity (08/19/2022)   Hunger Vital Sign    Worried About Running Out of Food in the Last Year: Never true    Ran Out of Food in the Last Year: Never true  Transportation Needs: No Transportation Needs (08/19/2022)   PRAPARE - Hydrologist (Medical): No    Lack of Transportation (Non-Medical): No  Physical Activity: Inactive (08/19/2022)   Exercise Vital Sign    Days of Exercise per Week: 0 days    Minutes of Exercise per Session: 0 min  Stress: Stress Concern Present (08/19/2022)   Anon Raices    Feeling of Stress : To some extent  Social Connections: Socially Isolated (08/19/2022)   Social Connection and Isolation Panel [NHANES]    Frequency of Communication with Friends and Family: Three times a week    Frequency of Social Gatherings with Friends and Family: Three times a week    Attends Religious Services: Never    Active Member of Clubs or Organizations: No    Attends Archivist Meetings: Never    Marital Status: Divorced    Allergies:  Allergies   Allergen Reactions   Abilify [Aripiprazole] Other (See Comments)    Hallucinations/mania   Demerol [Meperidine]     Avoid Demerol due to patient taking MAOI drug.   Ivp Dye [Iodinated Contrast Media] Other (See Comments)    CARDIAC ARREST   Penicillins Anaphylaxis    Has patient had a PCN reaction causing immediate rash, facial/tongue/throat swelling, SOB or lightheadedness with hypotension: Yes Has patient had a PCN reaction causing severe rash involving mucus membranes or skin necrosis: Yes Has patient had a PCN reaction that required hospitalization: No Has patient had a PCN reaction occurring within the last 10 years: Yes If all of the above answers are "NO", then may proceed with Cephalosporin use.    Metoclopramide Hcl Anxiety and Rash    Metabolic Disorder Labs: Lab Results  Component Value Date   HGBA1C 5.4 08/13/2021   No results found for: "PROLACTIN" Lab Results  Component Value Date   CHOL 198 09/10/2015   TRIG 68.0 09/10/2015   HDL 76.60 09/10/2015   CHOLHDL 3 09/10/2015   VLDL 13.6 09/10/2015   LDLCALC 108 (H) 09/10/2015   LDLCALC 113 11/07/2010   Lab Results  Component Value Date   TSH 1.645 11/30/2014    Therapeutic Level Labs: No results found for: "LITHIUM" No results found for: "VALPROATE" No results found for: "CBMZ"  Current Medications: Current Outpatient Medications  Medication Sig Dispense Refill   acetaminophen (TYLENOL) 500 MG tablet Take 1,000 mg by mouth every 6 (six) hours as needed for moderate pain.     aspirin 81 MG EC tablet Take 81 mg by mouth daily.     Calcium Carbonate (CALCIUM 600 PO) Take 600 mg by mouth 2 (two) times daily.     Cholecalciferol (VITAMIN D3) 1000 units CAPS  Take 1,000 Units by mouth daily.     Loperamide HCl (IMODIUM A-D PO) Take 2 mg by mouth daily.      Multiple Vitamin (MULTIVITAMIN) tablet Take 1 tablet by mouth daily.     triamcinolone cream (KENALOG) 0.1 % Apply 1 Application topically 2 (two) times  daily as needed. 45 g 1   [START ON 02/14/2023] LORazepam (ATIVAN) 0.5 MG tablet Take 1 tablet (0.5 mg total) by mouth at bedtime. 20 tabs for 30 days 20 tablet 1   phenelzine (NARDIL) 15 MG tablet Take 2 tablets (30 mg total) by mouth 2 (two) times daily. 120 tablet 1   No current facility-administered medications for this visit.     Musculoskeletal: Strength & Muscle Tone: within normal limits Gait & Station: normal Patient leans: N/A  Psychiatric Specialty Exam: Review of Systems  Psychiatric/Behavioral:  Positive for dysphoric mood and sleep disturbance. Negative for agitation, behavioral problems, confusion, decreased concentration, hallucinations, self-injury and suicidal ideas. The patient is nervous/anxious. The patient is not hyperactive.   All other systems reviewed and are negative.   Blood pressure (!) 164/89, pulse 99, temperature (!) 97.5 F (36.4 C), temperature source Skin, height 5' (1.524 m), weight 120 lb 6.4 oz (54.6 kg).Body mass index is 23.51 kg/m.  General Appearance: Fairly Groomed  Eye Contact:  Good  Speech:  Clear and Coherent  Volume:  Normal  Mood:   better  Affect:  Appropriate, Congruent, and calm  Thought Process:  Coherent  Orientation:  Full (Time, Place, and Person)  Thought Content: Logical   Suicidal Thoughts:  No  Homicidal Thoughts:  No  Memory:  Immediate;   Good  Judgement:  Good  Insight:  Good  Psychomotor Activity:  Normal  Concentration:  Concentration: Good and Attention Span: Good  Recall:  Good  Fund of Knowledge: Good  Language: Good  Akathisia:  No  Handed:  Right  AIMS (if indicated): not done  Assets:  Communication Skills Desire for Improvement  ADL's:  Intact  Cognition: WNL  Sleep:  Fair   Screenings: GAD-7    [facility] Office Visit from 02/05/2023 in [facility]  Total GAD-7 Score 3      PHQ2-9    [place] Office Visit from 02/05/2023 in [facility] Office Visit from 01/15/2023 in [facility] from 08/19/2022 in [facility] at [facility] Visit from 07/18/2019 in [facility] Visit from 07/15/2013 in [facility]  PHQ-2 Total Score 3 0 4 0 0  PHQ-9 Total Score '7 1 5 '$ -- --      Flowsheet Row Office Visit from 02/05/2023 in [place] [facility] from 11/22/2022 in [facility] at [place] Error: Q3, 4, or 5 should not be populated when Q2 is No No Risk        Assessment and Plan:  Shirly Bartosiewicz is a 82 y.o. 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   1. MDD (major depressive disorder), recurrent episode, mild ([facility]) Acute stressors include:  Other stressors include: loss of her dog, close friend a few years ago    History: Seen by Dr. Casimiro Needle for depression. Experienced hallucinations while on Abilify in  December 2023. Reportedly struggled with bipolar disorder throughout her life and has been on phenelzine for many years. There were episodes of her not sleeping for up to a few days and expressing anger towards her husband (resulting in breaking a table). Her daughter also mentioned that she was "not being nice" to her, yelling on the phone, and becoming angry at times. Overall improvement since the last visit.  According to the chart review, she has been under the care for diagnosis of depression, and there is no documentation of manic episodes.  She has been reported only stable on the current medication for many years according to the collateral.  Will continue current dose to target depression.  She was advised again to refrain from tyramine or  dopamine containing foods to avoid serotonin syndrome.  The patient has been provided with a list of foods to avoid.  2. Insomnia, unspecified type Stable.  Despite the advice to discontinue the lorazepam, she has been taking lorazepam since the last visit. Discussed the risk of fall, over sedation.  She agrees that this medication will be tapered off in the future.    # Cognition  - MMSE 27 02/3022 according to the chart review The exam reveals repeated storytelling and some exaggerated tales, particularly related to termination, and occasional inconsistent story, although this may be considered age-appropriate behavior.  She scored 4 on mini cog, and she denies any issues with memory.  IADL is independent. Will continue to assess this.    Plan Continue phenelzine 30 mg twice a day Continue lorazepam 0.5 mg at night as needed for anxiety/insomnia (20 tabs per month) Next appointment: 4/8 at 3:30 for 30 mins, IP    The patient demonstrates the following risk factors for suicide: Chronic risk factors for suicide include: psychiatric disorder of depression . Acute risk factors for suicide include: loss (financial, interpersonal, professional). Protective factors for this patient include: positive social support, coping skills, and hope for the future. Considering these factors, the overall suicide risk at this point appears to be low. Patient is appropriate for outpatient follow up.     Collaboration of Care: Collaboration of Care: Other reviewed notes in Epic  Patient/Guardian was advised Release of Information must be obtained prior to any record release in order to collaborate their care with an outside provider. Patient/Guardian was advised if they have not already done so to contact the registration department to sign all necessary forms in order for [place] to release information regarding their care.   Consent: Patient/Guardian gives verbal consent for treatment and assignment of benefits for  services provided during this visit. Patient/Guardian expressed understanding and agreed to proceed.    Norman Clay, MD 02/05/2023, 2:14 PM

## 2023-02-05 ENCOUNTER — Encounter: Payer: Self-pay | Admitting: Psychiatry

## 2023-02-05 ENCOUNTER — Ambulatory Visit: Payer: Medicare HMO | Admitting: Psychiatry

## 2023-02-05 VITALS — BP 164/89 | HR 99 | Temp 97.5°F | Ht 60.0 in | Wt 120.4 lb

## 2023-02-05 DIAGNOSIS — G4700 Insomnia, unspecified: Secondary | ICD-10-CM

## 2023-02-05 DIAGNOSIS — F330 Major depressive disorder, recurrent, mild: Secondary | ICD-10-CM | POA: Diagnosis not present

## 2023-02-05 DIAGNOSIS — R69 Illness, unspecified: Secondary | ICD-10-CM | POA: Diagnosis not present

## 2023-02-05 MED ORDER — LORAZEPAM 0.5 MG PO TABS: 0.5000 mg | ORAL_TABLET | Freq: Every day | ORAL | 1 refills | Status: DC

## 2023-02-05 MED ORDER — PHENELZINE SULFATE 15 MG PO TABS: 30.0000 mg | ORAL_TABLET | Freq: Two times a day (BID) | ORAL | 1 refills | Status: DC

## 2023-02-05 NOTE — Patient Instructions (Signed)
Continue phenelzine 30 mg twice a day Continue lorazepam 0.5 mg at night as needed for anxiety/insomnia (20 tabs per month) Next appointment: 4/8 at 3:30

## 2023-02-13 ENCOUNTER — Telehealth: Payer: Self-pay

## 2023-02-13 NOTE — Telephone Encounter (Signed)
pharmacy states that they had to order medication and hopefully it will come in today or tomorrow.

## 2023-02-13 NOTE — Telephone Encounter (Signed)
pt called states that she needs a refill on the phenelzine 34m . she states that she was told that you would take over care of this.

## 2023-02-13 NOTE — Telephone Encounter (Signed)
left patient a message about issues

## 2023-02-13 NOTE — Telephone Encounter (Signed)
It was sent to the pharmacy at her last visit with refill. Please verity if with the pharmacy.

## 2023-02-16 DIAGNOSIS — H2513 Age-related nuclear cataract, bilateral: Secondary | ICD-10-CM | POA: Diagnosis not present

## 2023-02-16 DIAGNOSIS — H353131 Nonexudative age-related macular degeneration, bilateral, early dry stage: Secondary | ICD-10-CM | POA: Diagnosis not present

## 2023-02-17 DIAGNOSIS — H524 Presbyopia: Secondary | ICD-10-CM | POA: Diagnosis not present

## 2023-02-17 DIAGNOSIS — H52223 Regular astigmatism, bilateral: Secondary | ICD-10-CM | POA: Diagnosis not present

## 2023-04-02 NOTE — Progress Notes (Signed)
BH MD/PA/NP OP Progress Note  04/06/2023 4:21 PM Adrienne [name]  MRN:  [id number]  Chief Complaint:  Chief Complaint  Patient presents with   Follow-up   HPI:  This is a follow-up appointment for depression and insomnia.  She reports frustration about the screening sheet, stating that "it is too (and laughed)."  She states that she has been doing pretty well.  She is thinking of moving, hopefully by the end of May.  She is trying to get the junk is taking care of.  She is planning to live in assisted living.  Her daughter is handling the paperwork.  They have been communicating at least 5 times a week.  She is looking forward to seeing her grandson, who is 7 year old.  She enjoys playing the piano.  She is trying to remove the grand piano.  Although she used to do play cards with others, she was not invited since the last Christmas.  She states that they were gossiping, and "it's their problem."  She continues to enjoy joining the class in on, and is involved in the church.  She feels happy.  She feels depressed once in a while as she misses her dog so much (and sobs).  She sleeps well.  She denies anxiety.  She denies SI.  She continues to drink a glass of wine or a cocktail at the dinner.  She does not think it is affecting her at all.  She also eats shredded cheese. She does not think she has issues with hypertension (she states that she is usually not that hight as in today's visit).  She will consider stopping those if she were to have any sign. She agrees that the medication will be sent for a total of two months, and will establish care with PCP in [place]. She agrees that this Clinical research associate will contact her daughter for continuation of care.   HBP 130/70-80  Substance use  Tobacco Alcohol Other substances/  Current  A glass of wine, cocktail denies  Past  Denies  denies  Past Treatment         Functional Status Instrumental Activities of Daily Living (IADLs):  Adrienne [name] is independent in  the following: managing finances, medications, driving Requires assistance with the following:   Activities of Daily Living (ADLs):  Adrienne [name] is independent in the following: bathing and hygiene, feeding, continence, grooming and toileting, walking   Wt Readings from Last 3 Encounters:  04/06/23 121 lb 9.6 oz (55.2 kg)  02/05/23 120 lb 6.4 oz (54.6 kg)  01/28/23 124 lb (56.2 kg)    Support: daughter Household: by herself Marital status: divorced, married twice  Number of children: 1 daughter (high school Runner, broadcasting/film/video) Employment: retired, used to work at [employer], Art therapist,  Education:  [employer], BS in Tree surgeon Last PCP / ongoing medical evaluation:  She grew up in [place].  She went to [place], [place] and moved to [place] 15 years ago due to her husband's work.  She states that her mother suffered from depression, and had ileostomy due to UC. She felt like Armine was "adult" and her mother was a "child." Her mother was "smothering."  She reports good support from her father, who "would do anything for kids."  Visit Diagnosis:    ICD-10-CM   1. MDD (major depressive disorder), recurrent, in partial remission  F33.41     2. Insomnia, unspecified type  G47.00       Past Psychiatric  History: Please see initial evaluation for full details. I have reviewed the history. No updates at this time.     Past Medical History:  Past Medical History:  Diagnosis Date   Anemia, unspecified    Bipolar affective disorder    Breast cancer    left   Bronchitis    Depression    Depressive disorder, not elsewhere classified    Disorder of bone and cartilage, unspecified    External hemorrhoids    Family history of breast cancer    Family history of pancreatic cancer    Family history of pancreatic cancer    Family history of prostate cancer    Genital herpes, unspecified    IBS (irritable bowel syndrome)    Lumbago    Macular degeneration    Personal history of  radiation therapy 2009   lefet breast ca   Spinal stenosis, unspecified region other than cervical     Past Surgical History:  Procedure Laterality Date   ABDOMINAL HYSTERECTOMY     Total but cervix left   bilateral tuboplasty     BREAST LUMPECTOMY Left 6/09   Left; high grade carcinoma in situ   CYSTOSCOPY WITH RETROGRADE PYELOGRAM, URETEROSCOPY AND STENT PLACEMENT Bilateral 10/07/2017   Procedure: RIGHT CYSTOSCOPY WITH BILATERAL RETROGRADE PYELOGRAM, URETEROSCOPY AND STENT PLACEMENT;  Surgeon: Crist Fat, MD;  Location: [facility];  Service: Urology;  Laterality: Bilateral;   DILATION AND CURETTAGE OF UTERUS     x 2   HOLMIUM LASER APPLICATION Right 10/07/2017   Procedure: HOLMIUM LASER APPLICATION;  Surgeon: Crist Fat, MD;  Location: [facility];  Service: Urology;  Laterality: Right;   MRI lumbar  05/2007   L4-L5 spinal stenosis   RECTOCELE REPAIR     TONSILLECTOMY      Family Psychiatric History: Please see initial evaluation for full details. I have reviewed the history. No updates at this time.     Family History:  Family History  Problem Relation Age of Onset   Anxiety disorder Mother    Depression Mother    Ulcerative colitis Mother    Breast cancer Mother 53   Colitis Mother        ulcerative   Pancreatic cancer Father 12   Prostate cancer Father 23   Alzheimer's disease Father    Heart attack Father    Hypertension Brother    Hyperlipidemia Brother    Breast cancer Paternal Grandmother 67   Ulcerative colitis Daughter    Colitis Daughter        ulcerative   Colon cancer Neg Hx     Social History:  Social History   Socioeconomic History   Marital status: Divorced    Spouse name: Not on file   Number of children: 1   Years of education: Not on file   Highest education level: Bachelor's degree (e.g., BA, AB, BS)  Occupational History   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    Comment: Retired  Tobacco Use   Smoking status: Never     Passive exposure: Never   Smokeless tobacco: Never  Vaping Use   Vaping Use: Never used  Substance and Sexual Activity   Alcohol use: Yes    Alcohol/week: 10.0 - 14.0 standard drinks of alcohol    Types: 10 - 14 Glasses of wine per week    Comment: 1 cocktail (manhattan) and 1 glass of wine daily   Drug use: No   Sexual activity: Not Currently  Other Topics Concern  Not on file  Social History Narrative   Tumultuous preadolescence----mom very sick and she moved around to various relatives      Has living will   Daughter is health care POA-- no alternate (but probably brother)   Would accept resuscitation attempts   Probably wouldn't want tube feedings if cognitively aware   [facility] to [facility] already arranged         Pt lives alone with her dog, she has 1 child, right handed, patient drinks coffee 1 cup in morning, no tea, and some soda.   Social Determinants of Health   Financial Resource Strain: Low Risk  (08/19/2022)   Overall Financial Resource Strain (CARDIA)    Difficulty of Paying Living Expenses: Not hard at all  Food Insecurity: No Food Insecurity (08/19/2022)   Hunger Vital Sign    Worried About Running Out of Food in the Last Year: Never true    Ran Out of Food in the Last Year: Never true  Transportation Needs: No Transportation Needs (08/19/2022)   PRAPARE - Administrator, Civil Service (Medical): No    Lack of Transportation (Non-Medical): No  Physical Activity: Inactive (08/19/2022)   Exercise Vital Sign    Days of Exercise per Week: 0 days    Minutes of Exercise per Session: 0 min  Stress: Stress Concern Present (08/19/2022)   [employer] of Occupational Health - Occupational Stress Questionnaire    Feeling of Stress : To some extent  Social Connections: Socially Isolated (08/19/2022)   Social Connection and Isolation Panel [NHANES]    Frequency of Communication with Friends and Family: Three times a week    Frequency of Social  Gatherings with Friends and Family: Three times a week    Attends Religious Services: Never    Active Member of Clubs or Organizations: No    Attends Banker Meetings: Never    Marital Status: Divorced    Allergies:  Allergies  Allergen Reactions   Abilify [Aripiprazole] Other (See Comments)    Hallucinations/mania   Demerol [Meperidine]     Avoid Demerol due to patient taking MAOI drug.   Ivp Dye [Iodinated Contrast Media] Other (See Comments)    CARDIAC ARREST   Penicillins Anaphylaxis    Has patient had a PCN reaction causing immediate rash, facial/tongue/throat swelling, SOB or lightheadedness with hypotension: Yes Has patient had a PCN reaction causing severe rash involving mucus membranes or skin necrosis: Yes Has patient had a PCN reaction that required hospitalization: No Has patient had a PCN reaction occurring within the last 10 years: Yes If all of the above answers are "NO", then may proceed with Cephalosporin use.    Metoclopramide Hcl Anxiety and Rash    Metabolic Disorder Labs: Lab Results  Component Value Date   HGBA1C 5.4 08/13/2021   No results found for: "PROLACTIN" Lab Results  Component Value Date   CHOL 198 09/10/2015   TRIG 68.0 09/10/2015   HDL 76.60 09/10/2015   CHOLHDL 3 09/10/2015   VLDL 13.6 09/10/2015   LDLCALC 108 (H) 09/10/2015   LDLCALC 113 11/07/2010   Lab Results  Component Value Date   TSH 1.645 11/30/2014    Therapeutic Level Labs: No results found for: "LITHIUM" No results found for: "VALPROATE" No results found for: "CBMZ"  Current Medications: Current Outpatient Medications  Medication Sig Dispense Refill   acetaminophen (TYLENOL) 500 MG tablet Take 1,000 mg by mouth every 6 (six) hours as needed for moderate pain.  aspirin 81 MG EC tablet Take 81 mg by mouth daily.     Calcium Carbonate (CALCIUM 600 PO) Take 600 mg by mouth 2 (two) times daily.     Cholecalciferol (VITAMIN D3) 1000 units CAPS Take 1,000  Units by mouth daily.     Loperamide HCl (IMODIUM A-D PO) Take 2 mg by mouth daily.      Multiple Vitamin (MULTIVITAMIN) tablet Take 1 tablet by mouth daily.     phenelzine (NARDIL) 15 MG tablet Take 2 tablets (30 mg total) by mouth 2 (two) times daily. 120 tablet 1   triamcinolone cream (KENALOG) 0.1 % Apply 1 Application topically 2 (two) times daily as needed. 45 g 1   No current facility-administered medications for this visit.     Musculoskeletal: Strength & Muscle Tone: within normal limits Gait & Station: normal Patient leans: N/A  Psychiatric Specialty Exam: Review of Systems  Psychiatric/Behavioral:  Positive for dysphoric mood. Negative for agitation, behavioral problems, confusion, decreased concentration, hallucinations, self-injury, sleep disturbance and suicidal ideas. The patient is not nervous/anxious and is not hyperactive.   All other systems reviewed and are negative.   Blood pressure (!) 151/81, pulse 97, temperature 97.6 F (36.4 C), temperature source Skin, height 5' (1.524 m), weight 121 lb 9.6 oz (55.2 kg).Body mass index is 23.75 kg/m.  General Appearance: Fairly Groomed  Eye Contact:  Good  Speech:  Clear and Coherent  Volume:  Normal  Mood:   good  Affect:  Appropriate, Congruent, and tearful when she talks about her dog  Thought Process:  Coherent  Orientation:  Full (Time, Place, and Person)  Thought Content: Logical   Suicidal Thoughts:  No  Homicidal Thoughts:  No  Memory:  Immediate;   Good  Judgement:  Good  Insight:  Fair  Psychomotor Activity:  Normal  Concentration:  Concentration: Good and Attention Span: Good  Recall:  Good  Fund of Knowledge: Good  Language: Good  Akathisia:  No  Handed:  Right  AIMS (if indicated): not done  Assets:  Communication Skills Desire for Improvement  ADL's:  Intact  Cognition: WNL  Sleep:  Good   Screenings: GAD-7    Flowsheet Row Office Visit from 04/06/2023 in [facility] Office Visit from 02/05/2023 in [facility]  Total GAD-7 Score 1 3      PHQ2-9    Flowsheet Row Office Visit from 04/06/2023 in [facility] Office Visit from 02/05/2023 in [facility] Office Visit from 01/15/2023 in [facility] Clinical Support from 08/19/2022 in [facility] Visit from 07/18/2019 in [facility] [place]  PHQ-2 Total Score 1 3 0 4 0  PHQ-9 Total Score 3 7 1 5  --      Flowsheet Row Office Visit from 02/05/2023 in [facility] ED from 11/22/2022 in [facility] Emergency Department at [facility]  C-SSRS RISK CATEGORY Error: Q3, 4, or 5 should not be populated when Q2 is No No Risk        Assessment and Plan:  Adrienne [name] is a 82 y.o. 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   1. MDD (major depressive disorder), recurrent, in partial remission Acute stressors include:  Other stressors include:  loss of her dog, close friend a few years ago    History: Seen by Dr. Donell Beers for depression. Experienced hallucinations while on Abilify in December 2023. Reportedly struggled with bipolar disorder throughout her life and has been on phenelzine for many years. There were episodes of her not sleeping for up to a few days and expressing anger towards her husband (resulting in breaking a table). Her daughter also mentioned that she was "not being nice" to her, yelling on the phone, and becoming angry at times. Although exam is notable for mood lability when she talks about her dog, she was able to regroup quickly, and there has been overall improvement in depressive symptoms and  anxiety since the last visit.  According to the collateral, she has been stable on the current medication regimen for many years.  Will continue current dose of phenelzine to target depression.  It has been discussed several times regarding the risk including hypertensive crisis with food containing tyramine.  However, she could continues to drink a wine and eat cheese at times.  Given she will be relocating, and denies any side effect, will continue the current medication regimen at this time.  She is advised that the medication will be prescribed only for the next 2 months. She is encouraged to establish care at [place]/before relocation with at least primary care to ensure continuation of care.   2. Insomnia, unspecified type Improving.  She has not used lorazepam for the past few months. She agrees that medication will not be filled this time.    # Cognition  - MMSE 27 02/3022 according to the chart review The exam reveals repeated storytelling and some exaggerated tales, particularly related to termination, and occasional inconsistent story, although this may be considered age-appropriate behavior.  She scored 4 on mini cog, and she denies any issues with memory.  IADL is independent. She will benefit from ongoing longitudinal assessment.   Plan Continue phenelzine 30 mg twice a day Hold lorazepam (was on 0.5 mg at night as needed for anxiety/insomnia,20 tabs per month. She has not used it for the past few months, and she agrees that there will be a no refill Next appointment: N/A. She will be moving to [place] her daughter to ensure the continuation of care in [place]. Left a voice message to contact the office.    The patient demonstrates the following risk factors for suicide: Chronic risk factors for suicide include: psychiatric disorder of depression . Acute risk factors for suicide include: loss (financial, interpersonal, professional). Protective factors for this patient include: positive social  support, coping skills, and hope for the future. Considering these factors, the overall suicide risk at this point appears to be low. Patient is appropriate for outpatient follow up.     Collaboration of Care: Collaboration of Care: Other reviewed notes in Epic  Patient/Guardian was advised Release of Information must be obtained prior to any record release in order to collaborate their care with an outside provider. Patient/Guardian was advised if they have not already done so to contact the registration department to sign all necessary forms in order for [place] to release information regarding their care.   Consent: Patient/Guardian gives verbal consent for treatment and assignment of benefits for services provided during this visit. Patient/Guardian expressed understanding and agreed to proceed.    Neysa Hotter, MD 04/06/2023, 4:21 PM

## 2023-04-06 ENCOUNTER — Encounter: Payer: Self-pay | Admitting: Psychiatry

## 2023-04-06 ENCOUNTER — Ambulatory Visit (INDEPENDENT_AMBULATORY_CARE_PROVIDER_SITE_OTHER): Payer: Medicare HMO | Admitting: Psychiatry

## 2023-04-06 VITALS — BP 151/81 | HR 97 | Temp 97.6°F | Ht 60.0 in | Wt 121.6 lb

## 2023-04-06 DIAGNOSIS — G4700 Insomnia, unspecified: Secondary | ICD-10-CM

## 2023-04-06 DIAGNOSIS — R69 Illness, unspecified: Secondary | ICD-10-CM | POA: Diagnosis not present

## 2023-04-06 DIAGNOSIS — F3341 Major depressive disorder, recurrent, in partial remission: Secondary | ICD-10-CM | POA: Diagnosis not present

## 2023-04-06 MED ORDER — PHENELZINE SULFATE 15 MG PO TABS: 30.0000 mg | ORAL_TABLET | Freq: Two times a day (BID) | ORAL | 1 refills | Status: DC

## 2023-04-06 NOTE — Patient Instructions (Signed)
Continue phenelzine 30 mg twice a day  Please establish care with primary care or psychiatrist in [place]. Medication will be filled for a total of two months.

## 2023-04-22 ENCOUNTER — Telehealth: Payer: Self-pay | Admitting: Internal Medicine

## 2023-04-22 DIAGNOSIS — Z23 Encounter for immunization: Secondary | ICD-10-CM

## 2023-04-22 MED ORDER — ZOSTER VAC RECOMB ADJUVANTED 50 MCG/0.5ML IM SUSR: 0.5000 mL | Freq: Once | INTRAMUSCULAR | 1 refills | Status: AC

## 2023-04-22 NOTE — Telephone Encounter (Signed)
Rx sent. Pt advised.  

## 2023-04-22 NOTE — Telephone Encounter (Signed)
Pt called asking if [place] could send orders or a prescription to [facility] in [place] for a shingles shot? Call back # [phone]

## 2023-04-24 ENCOUNTER — Encounter: Payer: Self-pay | Admitting: Family Medicine

## 2023-04-24 ENCOUNTER — Ambulatory Visit: Payer: Medicare HMO | Admitting: Family Medicine

## 2023-04-24 ENCOUNTER — Ambulatory Visit (INDEPENDENT_AMBULATORY_CARE_PROVIDER_SITE_OTHER): Payer: Medicare HMO | Admitting: Family Medicine

## 2023-04-24 ENCOUNTER — Ambulatory Visit (INDEPENDENT_AMBULATORY_CARE_PROVIDER_SITE_OTHER)
Admission: RE | Admit: 2023-04-24 | Discharge: 2023-04-24 | Disposition: A | Discharge status: Home / Self Care | Payer: Medicare HMO | Source: Ambulatory Visit | Attending: Family Medicine | Admitting: Family Medicine

## 2023-04-24 VITALS — BP 126/60 | HR 94 | Temp 97.2°F | Ht 60.0 in | Wt 124.5 lb

## 2023-04-24 DIAGNOSIS — M79674 Pain in right toe(s): Secondary | ICD-10-CM | POA: Diagnosis not present

## 2023-04-24 DIAGNOSIS — L03011 Cellulitis of right finger: Secondary | ICD-10-CM

## 2023-04-24 DIAGNOSIS — S92421A Displaced fracture of distal phalanx of right great toe, initial encounter for closed fracture: Secondary | ICD-10-CM | POA: Diagnosis not present

## 2023-04-24 LAB — URIC ACID: Uric Acid, Serum: 4.3 mg/dL (ref 2.4–7.0)

## 2023-04-24 MED ORDER — CLOTRIMAZOLE-BETAMETHASONE 1-0.05 % EX CREA: 1.0000 | TOPICAL_CREAM | Freq: Every day | CUTANEOUS | 0 refills | Status: DC

## 2023-04-24 NOTE — Assessment & Plan Note (Signed)
Acute, started following fall out of bed, accidental.  Hit great toe at MCP joint.  Not clearly aware of mechanism of injury.  Will evaluate with x-ray to rule out fracture and patient with osteopenia.  Most likely soft tissue injury.  Recommend ice and elevation Tylenol as needed for pain. Will also rule out new onset gout following traumatic injury with uric acid level.

## 2023-04-24 NOTE — Progress Notes (Signed)
Patient ID: Adrienne [name], female    DOB: 01-13-1941, 82 y.o.   MRN: [id number]  This visit was conducted in person.  BP (!) 140/70   Pulse 94   Temp (!) 97.2 F (36.2 C) (Temporal)   Ht 5' (1.524 m)   Wt 124 lb 8 oz (56.5 kg)   SpO2 94%   BMI 24.31 kg/m    CC:  Chief Complaint  Patient presents with   Swollen Toe    Right Big Toe   Finger Issue    Right Pinky    Subjective:   HPI: Adrienne [name] is a 82 y.o. female patient of Dr. Alphonsus Sias with history of major depressive disorder, anemia of chronic disease and aortic atherosclerosis presenting on 04/24/2023 for Swollen Toe (Right Big Toe) and Finger Issue (Right Pinky)  She reports 2 new issues:  1. swollen right great toe:  Noted ever since she fell 1 month ago (rolled out of bed, hit head, hit floor).. noted soreness  and swelling in MCP joint next day.  She  is able to weight bear, but stiff.  Hx of osteopenia.  2. right pinky finger: HAS irritated finger tip on right pinky tip.Marland Kitchen dry flaky,  no discharge.       Relevant past medical, surgical, family and social history reviewed and updated as indicated. Interim medical history since our last visit reviewed. Allergies and medications reviewed and updated. Outpatient Medications Prior to Visit  Medication Sig Dispense Refill   acetaminophen (TYLENOL) 500 MG tablet Take 1,000 mg by mouth every 6 (six) hours as needed for moderate pain.     aspirin 81 MG EC tablet Take 81 mg by mouth daily.     Calcium Carbonate (CALCIUM 600 PO) Take 600 mg by mouth 2 (two) times daily.     Cholecalciferol (VITAMIN D3) 1000 units CAPS Take 1,000 Units by mouth daily.     Loperamide HCl (IMODIUM A-D PO) Take 2 mg by mouth daily.      Multiple Vitamin (MULTIVITAMIN) tablet Take 1 tablet by mouth daily.     phenelzine (NARDIL) 15 MG tablet Take 2 tablets (30 mg total) by mouth 2 (two) times daily. 120 tablet 1   triamcinolone cream (KENALOG) 0.1 % Apply 1 Application topically 2  (two) times daily as needed. 45 g 1   No facility-administered medications prior to visit.     Per HPI unless specifically indicated in ROS section below Review of Systems  Constitutional:  Negative for fatigue and fever.  HENT:  Negative for congestion.   Eyes:  Negative for pain.  Respiratory:  Negative for cough and shortness of breath.   Cardiovascular:  Negative for chest pain, palpitations and leg swelling.  Gastrointestinal:  Negative for abdominal pain.  Genitourinary:  Negative for dysuria and vaginal bleeding.  Musculoskeletal:  Negative for back pain.  Neurological:  Negative for syncope, light-headedness and headaches.  Psychiatric/Behavioral:  Negative for dysphoric mood.    Objective:  BP (!) 140/70   Pulse 94   Temp (!) 97.2 F (36.2 C) (Temporal)   Ht 5' (1.524 m)   Wt 124 lb 8 oz (56.5 kg)   SpO2 94%   BMI 24.31 kg/m   Wt Readings from Last 3 Encounters:  04/24/23 124 lb 8 oz (56.5 kg)  01/28/23 124 lb (56.2 kg)  01/15/23 124 lb (56.2 kg)      Physical Exam Constitutional:      General: She is not in acute distress.  Appearance: Normal appearance. She is well-developed. She is not ill-appearing or toxic-appearing.  HENT:     Head: Normocephalic.     Right Ear: Hearing, tympanic membrane, ear canal and external ear normal. Tympanic membrane is not erythematous, retracted or bulging.     Left Ear: Hearing, tympanic membrane, ear canal and external ear normal. Tympanic membrane is not erythematous, retracted or bulging.     Nose: No mucosal edema or rhinorrhea.     Right Sinus: No maxillary sinus tenderness or frontal sinus tenderness.     Left Sinus: No maxillary sinus tenderness or frontal sinus tenderness.     Mouth/Throat:     Pharynx: Uvula midline.  Eyes:     General: Lids are normal. Lids are everted, no foreign bodies appreciated.     Conjunctiva/sclera: Conjunctivae normal.     Pupils: Pupils are equal, round, and reactive to light.  Neck:      Thyroid: No thyroid mass or thyromegaly.     Vascular: No carotid bruit.     Trachea: Trachea normal.  Cardiovascular:     Rate and Rhythm: Normal rate and regular rhythm.     Pulses: Normal pulses.     Heart sounds: Normal heart sounds, S1 normal and S2 normal. No murmur heard.    No friction rub. No gallop.  Pulmonary:     Effort: Pulmonary effort is normal. No tachypnea or respiratory distress.     Breath sounds: Normal breath sounds. No decreased breath sounds, wheezing, rhonchi or rales.  Abdominal:     General: Bowel sounds are normal.     Palpations: Abdomen is soft.     Tenderness: There is no abdominal tenderness.  Musculoskeletal:     Cervical back: Normal range of motion and neck supple.     Right foot: Normal range of motion. Swelling, deformity, tenderness and bony tenderness present.     Comments:  Change per picture of skin at  edge of pinky finger on right   Skin:    General: Skin is warm and dry.     Findings: No rash.  Neurological:     Mental Status: She is alert.  Psychiatric:        Mood and Affect: Mood is not anxious or depressed.        Speech: Speech normal.        Behavior: Behavior normal. Behavior is cooperative.        Thought Content: Thought content normal.        Judgment: Judgment normal.            Results for orders placed or performed in visit on 01/15/23  Comprehensive metabolic panel  Result Value Ref Range   Sodium 140 135 - 145 mEq/L   Potassium 4.5 3.5 - 5.1 mEq/L   Chloride 101 96 - 112 mEq/L   CO2 33 (H) 19 - 32 mEq/L   Glucose, Bld 100 (H) 70 - 99 mg/dL   BUN 13 6 - 23 mg/dL   Creatinine, Ser 6.21 0.40 - 1.20 mg/dL   Total Bilirubin 0.3 0.2 - 1.2 mg/dL   Alkaline Phosphatase 59 39 - 117 U/L   AST 23 0 - 37 U/L   ALT 12 0 - 35 U/L   Total Protein 7.3 6.0 - 8.3 g/dL   Albumin 4.2 3.5 - 5.2 g/dL   GFR 30.86 >57.84 mL/min   Calcium 9.8 8.4 - 10.5 mg/dL  CBC  Result Value Ref Range   WBC 4.4 4.0 -  10.5 K/uL   RBC  3.92 3.87 - 5.11 Mil/uL   Platelets 276.0 150.0 - 400.0 K/uL   Hemoglobin 11.5 (L) 12.0 - 15.0 g/dL   HCT 16.1 (L) 09.6 - 04.5 %   MCV 89.2 78.0 - 100.0 fl   MCHC 32.9 30.0 - 36.0 g/dL   RDW 40.9 81.1 - 91.4 %    Assessment and Plan  Great toe pain, right Assessment & Plan:  Acute, started following fall out of bed, accidental.  Hit great toe at MCP joint.  Not clearly aware of mechanism of injury.  Will evaluate with x-ray to rule out fracture and patient with osteopenia.  Most likely soft tissue injury.  Recommend ice and elevation Tylenol as needed for pain. Will also rule out new onset gout following traumatic injury with uric acid level.    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  Will treat with topical betamethasone clotrimazole cream daily for several weeks to months.  If not improving as expected consider terbinafine course.  Orders: -     Uric acid -     DG Toe Great Right; Future  Other orders -     Clotrimazole-Betamethasone; Apply 1 Application topically daily.  Dispense: 30 g; Refill: 0    No follow-ups on file.   Kerby Nora, MD

## 2023-04-24 NOTE — Patient Instructions (Addendum)
Apply typical steroid/ fungal infection.  Please stop at the lab to have labs drawn.  We will call with X-ray results.

## 2023-04-24 NOTE — Assessment & Plan Note (Signed)
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  Will treat with topical betamethasone clotrimazole cream daily for several weeks to months.  If not improving as expected consider terbinafine course.

## 2023-04-29 ENCOUNTER — Telehealth: Payer: Self-pay | Admitting: Internal Medicine

## 2023-04-29 ENCOUNTER — Other Ambulatory Visit: Payer: Self-pay | Admitting: Family Medicine

## 2023-04-29 DIAGNOSIS — S92911A Unspecified fracture of right toe(s), initial encounter for closed fracture: Secondary | ICD-10-CM

## 2023-04-29 DIAGNOSIS — S92912A Unspecified fracture of left toe(s), initial encounter for closed fracture: Secondary | ICD-10-CM

## 2023-04-29 NOTE — Telephone Encounter (Signed)
Patient called in and stated that she seen Dr. Ermalene Searing for her toe and a referral was sent in. She missed her appt with [facility] due to not finding it. She stated that right now her toe is fine and if she need to see them she will call back to get schedule. She stated that she has a copy of the CD still. Thank you!

## 2023-04-30 ENCOUNTER — Encounter: Payer: Self-pay | Admitting: Family Medicine

## 2023-04-30 NOTE — Telephone Encounter (Signed)
Excellent alignment 4 weeks ago.  These usually do well with minimal intervention.  At most, I would have put her in a post-op shoe, but you do not need to do that unless pain is significant with walking.  4 weeks out she should be fine without doing anything more.

## 2023-04-30 NOTE — Progress Notes (Signed)
Call Please let patient know I had Dr.  Patsy Lager our sports medicine doctor review her x-ray and he agrees and is reasonable to not see the orthopedic doctor given improvement in pain 4 weeks out.  If you are walking easily there is no need for the postop shoe. Okay to cancel referral to orthopedics.

## 2023-04-30 NOTE — Telephone Encounter (Signed)
Great.  Thanks

## 2023-04-30 NOTE — Telephone Encounter (Signed)
  Adrienne [name], can you please take a look at this 04/24/23 x-ray for me showing minimally displaced first proximal phalangeal fracture the patient was set up to see Ortho about it but missed her appointment.  Her pain is improving and she is about 4 weeks out from the initial injury. she feels like the pain is improving and does not want to see an orthopedic doctor anymore. I feel like this is reasonable given time since fracture and minimal displacement but just wanted to get your opinion. Thanks!

## 2023-04-30 NOTE — Progress Notes (Signed)
Called and spoke to pt. She she will let ortho know to cancel the appointment. Said she is still doing better.

## 2023-05-12 ENCOUNTER — Telehealth: Payer: Self-pay | Admitting: Internal Medicine

## 2023-05-12 NOTE — Telephone Encounter (Signed)
Patient notified as instructed by telephone and verbalized understanding. Patient stated that she had diarrhea this morning but is feeling much better now. Patient stated that she took the tick and has it in a container.  Patient denies a fever or any other symptoms. Patient declined an office visit at this time. Patient stated she will call back and schedule an appointment if she does not continue to improve.

## 2023-05-12 NOTE — Telephone Encounter (Signed)
Patient called in stating that she discovered a tick on her today,that she discovered 2 days ago that she thought was a mole so she left it,but this morning she seen that it started to move so she removed it with tweezers.She called in today stating that she woke up with excess diarrhea,she doesn't feel bad ,just the diarrhea. She would like to know If she can possibly be prescribed something for it.I offered her an appointment ,but she stated she would just like to know if she can have something called in?

## 2023-05-13 NOTE — Telephone Encounter (Signed)
That sounds fine If she is feeling better now, no visit needed

## 2023-05-20 DIAGNOSIS — E1165 Type 2 diabetes mellitus with hyperglycemia: Secondary | ICD-10-CM | POA: Diagnosis not present

## 2023-05-26 ENCOUNTER — Telehealth: Payer: Self-pay

## 2023-05-26 NOTE — Telephone Encounter (Signed)
Left message on VM for pt's daughter.

## 2023-05-26 NOTE — Telephone Encounter (Signed)
Patient daughter called states she is coming down 6/24 6/26 to help move her. Would like to know if she can get letter that it is medically necessary for her to be here to move her mom. Would like letter in my chart so she can print off.

## 2023-05-26 NOTE — Telephone Encounter (Signed)
Please let her know I put a letter on my chart

## 2023-05-29 ENCOUNTER — Other Ambulatory Visit: Payer: Self-pay | Admitting: Psychiatry

## 2023-05-29 ENCOUNTER — Other Ambulatory Visit: Payer: Self-pay | Admitting: Family Medicine

## 2023-06-11 ENCOUNTER — Ambulatory Visit (INDEPENDENT_AMBULATORY_CARE_PROVIDER_SITE_OTHER): Payer: Medicare HMO | Admitting: Internal Medicine

## 2023-06-11 ENCOUNTER — Encounter: Payer: Self-pay | Admitting: Internal Medicine

## 2023-06-11 VITALS — BP 170/90 | HR 93 | Temp 98.1°F | Ht 60.0 in | Wt 122.0 lb

## 2023-06-11 DIAGNOSIS — F339 Major depressive disorder, recurrent, unspecified: Secondary | ICD-10-CM | POA: Diagnosis not present

## 2023-06-11 NOTE — Progress Notes (Signed)
Subjective:    Patient ID: Adrienne [name], female    DOB: [birth year], 82 y.o.   MRN: [id number]  HPI Here due to emotional upset about upcoming move  "Every bone in my body hurts. I have no energy left" Has lost 20#--- (weight is actually the same)  Upset at daughter  Hadn't given her dimensions of her new place Upset (yelling here) because daughter isn't coming down to help her pack or move Daughter still teaching in school  Hasn't spoken to the psychiatrist Doesn't want to go back  Current Outpatient Medications on File Prior to Visit  Medication Sig Dispense Refill   acetaminophen (TYLENOL) 500 MG tablet Take 1,000 mg by mouth every 6 (six) hours as needed for moderate pain.     aspirin 81 MG EC tablet Take 81 mg by mouth daily.     clotrimazole-betamethasone (LOTRISONE) cream APPLY 1 APPLICATION TOPICALLY DAILY 30 g 0   Loperamide HCl (IMODIUM A-D PO) Take 2 mg by mouth daily.      Multiple Vitamin (MULTIVITAMIN) tablet Take 1 tablet by mouth daily.     triamcinolone cream (KENALOG) 0.1 % Apply 1 Application topically 2 (two) times daily as needed. 45 g 1   Calcium Carbonate (CALCIUM 600 PO) Take 600 mg by mouth 2 (two) times daily.     Cholecalciferol (VITAMIN D3) 1000 units CAPS Take 1,000 Units by mouth daily.     phenelzine (NARDIL) 15 MG tablet Take 2 tablets (30 mg total) by mouth 2 (two) times daily. 120 tablet 1   [DISCONTINUED] Omega-3-acid Ethyl Esters (LOVAZA PO) Take 2,000 mg by mouth 2 (two) times daily in the am and at bedtime..       No current facility-administered medications on file prior to visit.    Allergies  Allergen Reactions   Abilify [Aripiprazole] Other (See Comments)    Hallucinations/mania   Demerol [Meperidine]     Avoid Demerol due to patient taking MAOI drug.   Ivp Dye [Iodinated Contrast Media] Other (See Comments)    CARDIAC ARREST   Penicillins Anaphylaxis    Has patient had a PCN reaction causing immediate rash, facial/tongue/throat  swelling, SOB or lightheadedness with hypotension: Yes Has patient had a PCN reaction causing severe rash involving mucus membranes or skin necrosis: Yes Has patient had a PCN reaction that required hospitalization: No Has patient had a PCN reaction occurring within the last 10 years: Yes If all of the above answers are "NO", then may proceed with Cephalosporin use.    Metoclopramide Hcl Anxiety and Rash    Past Medical History:  Diagnosis Date   Anemia, unspecified    Bipolar affective disorder (HCC)    Breast cancer (HCC)    left   Bronchitis    Depression    Depressive disorder, not elsewhere classified    Disorder of bone and cartilage, unspecified    External hemorrhoids    Family history of breast cancer    Family history of pancreatic cancer    Family history of pancreatic cancer    Family history of prostate cancer    Genital herpes, unspecified    IBS (irritable bowel syndrome)    Lumbago    Macular degeneration    Personal history of radiation therapy 2009   lefet breast ca   Spinal stenosis, unspecified region other than cervical     Past Surgical History:  Procedure Laterality Date   ABDOMINAL HYSTERECTOMY     Total but cervix left   bilateral  tuboplasty     BREAST LUMPECTOMY Left 6/09   Left; high grade carcinoma in situ   CYSTOSCOPY WITH RETROGRADE PYELOGRAM, URETEROSCOPY AND STENT PLACEMENT Bilateral 10/07/2017   Procedure: RIGHT CYSTOSCOPY WITH BILATERAL RETROGRADE PYELOGRAM, URETEROSCOPY AND STENT PLACEMENT;  Surgeon: Crist Fat, MD;  Location: [facility];  Service: Urology;  Laterality: Bilateral;   DILATION AND CURETTAGE OF UTERUS     x 2   HOLMIUM LASER APPLICATION Right 10/07/2017   Procedure: HOLMIUM LASER APPLICATION;  Surgeon: Crist Fat, MD;  Location: [facility];  Service: Urology;  Laterality: Right;   MRI lumbar  05/2007   L4-L5 spinal stenosis   RECTOCELE REPAIR     TONSILLECTOMY      Family History  Problem Relation Age of  Onset   Anxiety disorder Mother    Depression Mother    Ulcerative colitis Mother    Breast cancer Mother 44   Colitis Mother        ulcerative   Pancreatic cancer Father 66   Prostate cancer Father 11   Alzheimer's disease Father    Heart attack Father    Hypertension Brother    Hyperlipidemia Brother    Breast cancer Paternal Grandmother 67   Ulcerative colitis Daughter    Colitis Daughter        ulcerative   Colon cancer Neg Hx     Social History   Socioeconomic History   Marital status: Divorced    Spouse name: Not on file   Number of children: 1   Years of education: Not on file   Highest education level: Bachelor's degree (e.g., BA, AB, BS)  Occupational History   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    Comment: Retired  Tobacco Use   Smoking status: Never    Passive exposure: Never   Smokeless tobacco: Never  Vaping Use   Vaping Use: Never used  Substance and Sexual Activity   Alcohol use: Yes    Alcohol/week: 10.0 - 14.0 standard drinks of alcohol    Types: 10 - 14 Glasses of wine per week    Comment: 1 cocktail (manhattan) and 1 glass of wine daily   Drug use: No   Sexual activity: Not Currently  Other Topics Concern   Not on file  Social History Narrative   Tumultuous preadolescence----mom very sick and she moved around to various relatives      Has living will   Daughter is health care POA-- no alternate (but probably brother)   Would accept resuscitation attempts   Probably wouldn't want tube feedings if cognitively aware   [facility] donation to [facility] already arranged         Pt lives alone with her dog, she has 1 child, right handed, patient drinks coffee 1 cup in morning, no tea, and some soda.   Social Determinants of Health   Financial Resource Strain: Low Risk  (08/19/2022)   Overall Financial Resource Strain (CARDIA)    Difficulty of Paying Living Expenses: Not hard at all  Food Insecurity: No Food Insecurity (08/19/2022)   Hunger  Vital Sign    Worried About Running Out of Food in the Last Year: Never true    Ran Out of Food in the Last Year: Never true  Transportation Needs: No Transportation Needs (08/19/2022)   PRAPARE - Administrator, Civil Service (Medical): No    Lack of Transportation (Non-Medical): No  Physical Activity: Inactive (08/19/2022)   Exercise Vital Sign  Days of Exercise per Week: 0 days    Minutes of Exercise per Session: 0 min  Stress: Stress Concern Present (08/19/2022)   [employer] of Occupational Health - Occupational Stress Questionnaire    Feeling of Stress : To some extent  Social Connections: Socially Isolated (08/19/2022)   Social Connection and Isolation Panel [NHANES]    Frequency of Communication with Friends and Family: Three times a week    Frequency of Social Gatherings with Friends and Family: Three times a week    Attends Religious Services: Never    Active Member of Clubs or Organizations: No    Attends Banker Meetings: Never    Marital Status: Divorced  Catering manager Violence: Not At Risk (08/19/2022)   Humiliation, Afraid, Rape, and Kick questionnaire    Fear of Current or Ex-Partner: No    Emotionally Abused: No    Physically Abused: No    Sexually Abused: No   Review of Systems Will be driving up to [place] Doesn't know about plans for the move    Objective:   Physical Exam Constitutional:      Appearance: Normal appearance.  Neurological:     Mental Status: She is alert.  Psychiatric:     Comments: Angry and upset Not really manic though Does calm intermittently            Assessment & Plan:

## 2023-06-11 NOTE — Assessment & Plan Note (Addendum)
Really upset now about her impending move to near [facility] her daughter has left her up in the air---but she has to finish her job and then likely will be giving her support. I will call daughter to confirm the plans Needs to continue the nardil indefinitely Discussed that she should establish with psychiatrist after move to [place]  Spoke to daughter Actually plans are all in place and appropriate I discussed this all with her She has lorazepam to take--but doesn't really like it (but suggested she use this if she gets really upset)

## 2023-06-12 ENCOUNTER — Telehealth: Payer: Self-pay | Admitting: Internal Medicine

## 2023-06-12 NOTE — Telephone Encounter (Signed)
Left message to call office

## 2023-06-12 NOTE — Telephone Encounter (Addendum)
Patient called, wanted to relay she slept much better last night

## 2023-06-12 NOTE — Telephone Encounter (Signed)
Patient's daughter on the phone to follow-up about her mom's visit yesterday. Stated she had asked that she be given a mild sedative but was unable to get much information from her mom  Please call her at [phone] [facility])

## 2023-06-12 NOTE — Telephone Encounter (Signed)
Called pt back. She said she did have some lorazepam and took one last night and slept much better. She did sound a lot better today. I told her that with everything that is going on, she may need to take it more often. Advised er to let us know if she needs a refill.   Called [place] and let her know she sounded much better. She had not talked to her mom today. She will probably get the pt to get a refill of the lorazepam before leaving.

## 2023-06-12 NOTE — Telephone Encounter (Signed)
Spoke to pt's daughter. She does not know what to do. I will call the pt later.

## 2023-06-13 MED ORDER — LORAZEPAM 0.5 MG PO TABS: 0.2500 mg | ORAL_TABLET | Freq: Two times a day (BID) | ORAL | 0 refills | Status: AC | PRN

## 2023-06-13 NOTE — Addendum Note (Signed)
Addended by: Tillman Abide I on: 06/13/2023 10:16 AM   Modules accepted: Orders

## 2023-06-15 ENCOUNTER — Ambulatory Visit: Payer: Medicare HMO | Admitting: Internal Medicine

## 2023-06-15 NOTE — Telephone Encounter (Signed)
Left message on VM.

## 2023-06-16 ENCOUNTER — Ambulatory Visit: Payer: Medicare HMO | Admitting: Internal Medicine

## 2023-06-17 ENCOUNTER — Telehealth: Payer: Self-pay

## 2023-06-17 ENCOUNTER — Other Ambulatory Visit: Payer: Self-pay | Admitting: Psychiatry

## 2023-06-17 MED ORDER — PHENELZINE SULFATE 15 MG PO TABS: 30.0000 mg | ORAL_TABLET | Freq: Two times a day (BID) | ORAL | 0 refills | Status: AC

## 2023-06-17 NOTE — Telephone Encounter (Signed)
The medication has been ordered as requested. However, please discuss with the patient's daughter to ensure proper management of this medication to prevent accidental overconsumption. There is a concern regarding potential interactions with phenelzine if the patient consumes alcohol or eats cheese, which could lead to significant adverse reactions.

## 2023-06-17 NOTE — Telephone Encounter (Signed)
pt daughter left a message that her [place] and she will need enough medications to last until they can find another provider for her. please send 90 day supplies to the [facility] [place].   Pt last seen on 4-8

## 2023-06-17 NOTE — Telephone Encounter (Signed)
Patient returned call,would like a call back 

## 2023-06-17 NOTE — Telephone Encounter (Signed)
Left another message on vm. Advised her I would be leaving at 330 today and will be back at 8am.

## 2023-06-18 ENCOUNTER — Telehealth: Payer: Self-pay | Admitting: Psychiatry

## 2023-06-18 NOTE — Telephone Encounter (Signed)
Per dr. Vanetta Shawl "FYi- I was able to talk with her daughter about the medication, so no need to call her anymore, fyi. "

## 2023-06-18 NOTE — Telephone Encounter (Signed)
I discussed with her daughter, Ms. Everlean Cherry, the potential risk of hypertensive crisis with phenelzine, wine, and cheese, which can cause detrimental outcomes such as death. The daughter expressed understanding of this risk and agrees to manage her mother's medication. She will come to [place] in a few days and plans to set up an appointment with geriatrics in [place]. She is also planning to find a psychiatrist in the area. She understands that the record can be sent as needed after we receive the ROI.

## 2023-06-27 ENCOUNTER — Other Ambulatory Visit: Payer: Self-pay | Admitting: Internal Medicine

## 2023-07-24 DIAGNOSIS — L259 Unspecified contact dermatitis, unspecified cause: Secondary | ICD-10-CM | POA: Diagnosis not present

## 2023-07-24 DIAGNOSIS — S40861A Insect bite (nonvenomous) of right upper arm, initial encounter: Secondary | ICD-10-CM | POA: Diagnosis not present

## 2023-07-24 DIAGNOSIS — W57XXXA Bitten or stung by nonvenomous insect and other nonvenomous arthropods, initial encounter: Secondary | ICD-10-CM | POA: Diagnosis not present

## 2023-07-31 DIAGNOSIS — F32A Depression, unspecified: Secondary | ICD-10-CM | POA: Diagnosis not present

## 2023-07-31 DIAGNOSIS — R21 Rash and other nonspecific skin eruption: Secondary | ICD-10-CM | POA: Diagnosis not present

## 2023-07-31 DIAGNOSIS — Z758 Other problems related to medical facilities and other health care: Secondary | ICD-10-CM | POA: Diagnosis not present

## 2023-08-20 ENCOUNTER — Inpatient Hospital Stay: Payer: Medicare HMO | Attending: Internal Medicine | Admitting: Nurse Practitioner

## 2023-08-20 ENCOUNTER — Encounter: Payer: Self-pay | Admitting: Nurse Practitioner

## 2023-08-20 ENCOUNTER — Inpatient Hospital Stay: Payer: Medicare HMO
